# Patient Record
Sex: Female | Born: 1955 | Race: Asian | Hispanic: No | Marital: Single | Smoking: Never smoker
Health system: Southern US, Community
[De-identification: ages and names within clinical notes are randomized; demographics above are authoritative.]

## PROBLEM LIST (undated history)

## (undated) DIAGNOSIS — Z9221 Personal history of antineoplastic chemotherapy: Secondary | ICD-10-CM

## (undated) DIAGNOSIS — Z9289 Personal history of other medical treatment: Secondary | ICD-10-CM

## (undated) DIAGNOSIS — D649 Anemia, unspecified: Secondary | ICD-10-CM

## (undated) DIAGNOSIS — C801 Malignant (primary) neoplasm, unspecified: Secondary | ICD-10-CM

---

## 2004-01-30 ENCOUNTER — Encounter: Admission: RE | Admit: 2004-01-30 | Discharge: 2004-01-30 | Payer: Self-pay | Admitting: Internal Medicine

## 2004-12-10 ENCOUNTER — Encounter: Admission: RE | Admit: 2004-12-10 | Discharge: 2004-12-10 | Payer: Self-pay | Admitting: Internal Medicine

## 2010-06-29 ENCOUNTER — Emergency Department (HOSPITAL_COMMUNITY): Admission: EM | Admit: 2010-06-29 | Discharge: 2010-06-29 | Payer: Self-pay | Admitting: Family Medicine

## 2010-06-29 ENCOUNTER — Emergency Department (HOSPITAL_COMMUNITY)
Admission: EM | Admit: 2010-06-29 | Discharge: 2010-06-29 | Payer: Self-pay | Source: Home / Self Care | Admitting: Emergency Medicine

## 2010-11-09 LAB — COMPREHENSIVE METABOLIC PANEL
ALT: 16 U/L (ref 0–35)
AST: 25 U/L (ref 0–37)
Albumin: 3.9 g/dL (ref 3.5–5.2)
Alkaline Phosphatase: 65 U/L (ref 39–117)
Calcium: 8.8 mg/dL (ref 8.4–10.5)
GFR calc Af Amer: >60 mL/min (ref >60)
Glucose, Bld: 88 mg/dL (ref 70–99)
Potassium: 3.3 mEq/L — ABNORMAL LOW (ref 3.5–5.1)
Sodium: 139 mEq/L (ref 135–145)
Total Protein: 7.7 g/dL (ref 6.0–8.3)

## 2010-11-09 LAB — DIFFERENTIAL
Basophils Relative: 1 % (ref 0–1)
Eosinophils Relative: 2 % (ref 0–5)
Lymphs Abs: 1.5 10*3/uL (ref 0.7–4.0)
Monocytes Absolute: 0.6 10*3/uL (ref 0.1–1.0)
Monocytes Relative: 11 % (ref 3–12)
Neutro Abs: 3.4 10*3/uL (ref 1.7–7.7)

## 2010-11-09 LAB — POCT URINALYSIS DIPSTICK
Ketones, ur: NEGATIVE mg/dL
Nitrite: NEGATIVE
Protein, ur: NEGATIVE mg/dL
pH: 6 (ref 5.0–8.0)

## 2010-11-09 LAB — URINALYSIS, ROUTINE W REFLEX MICROSCOPIC
Bilirubin Urine: NEGATIVE
Glucose, UA: NEGATIVE mg/dL
Ketones, ur: NEGATIVE mg/dL
Nitrite: NEGATIVE
Specific Gravity, Urine: 1.003 — ABNORMAL LOW (ref 1.005–1.030)
pH: 6.5 (ref 5.0–8.0)

## 2010-11-09 LAB — LIPASE, BLOOD: Lipase: 37 U/L (ref 11–59)

## 2010-11-09 LAB — CBC
HCT: 30.9 % — ABNORMAL LOW (ref 36.0–46.0)
MCHC: 30.1 g/dL (ref 30.0–36.0)
Platelets: 219 10*3/uL (ref 150–400)
RDW: 21.4 % — ABNORMAL HIGH (ref 11.5–15.5)
WBC: 5.7 10*3/uL (ref 4.0–10.5)

## 2010-11-09 LAB — POCT PREGNANCY, URINE: Preg Test, Ur: NEGATIVE

## 2010-11-09 LAB — URINE MICROSCOPIC-ADD ON

## 2012-07-12 ENCOUNTER — Ambulatory Visit: Payer: Self-pay | Admitting: Family Medicine

## 2012-07-12 VITALS — BP 124/72 | HR 68 | Temp 97.9°F | Resp 16 | Ht 60.5 in | Wt 116.2 lb

## 2012-07-12 DIAGNOSIS — R319 Hematuria, unspecified: Secondary | ICD-10-CM

## 2012-07-12 DIAGNOSIS — R338 Other retention of urine: Secondary | ICD-10-CM

## 2012-07-12 DIAGNOSIS — R109 Unspecified abdominal pain: Secondary | ICD-10-CM

## 2012-07-12 DIAGNOSIS — D649 Anemia, unspecified: Secondary | ICD-10-CM

## 2012-07-12 LAB — POCT URINALYSIS DIPSTICK
Bilirubin, UA: NEGATIVE
Glucose, UA: NEGATIVE
Ketones, UA: NEGATIVE
Leukocytes, UA: NEGATIVE
Nitrite, UA: NEGATIVE
Spec Grav, UA: >=1.03
Urobilinogen, UA: 4
pH, UA: 6

## 2012-07-12 LAB — POCT CBC
HCT, POC: 35.9 % — AB (ref 37.7–47.9)
Hemoglobin: 10.4 g/dL — AB (ref 12.2–16.2)
Lymph, poc: 1.8 (ref 0.6–3.4)
MCH, POC: 18.5 pg — AB (ref 27–31.2)
MCHC: 29 g/dL — AB (ref 31.8–35.4)
MCV: 63.8 fL — AB (ref 80–97)
POC LYMPH PERCENT: 25.6 %L (ref 10–50)
RDW, POC: 19.5 %
WBC: 7 10*3/uL (ref 4.6–10.2)

## 2012-07-12 LAB — POCT UA - MICROSCOPIC ONLY
Casts, Ur, LPF, POC: NEGATIVE
Crystals, Ur, HPF, POC: NEGATIVE
Epithelial cells, urine per micros: NEGATIVE
Mucus, UA: POSITIVE
Yeast, UA: NEGATIVE

## 2012-07-12 MED ORDER — CIPROFLOXACIN HCL 500 MG PO TABS: 500.0000 mg | ORAL_TABLET | Freq: Two times a day (BID) | ORAL | Status: DC

## 2012-07-12 NOTE — Progress Notes (Signed)
[facility] [address], [place] [address] [phone]  Date:  07/12/2012   Name:  Elizabeth Saunders   DOB:  05/18/1956   MRN:  [id number]  PCP:  No primary provider on file.    Chief Complaint: Abdominal Pain, Back Pain and Urinary Retention   History of Present Illness:  Elizabeth Saunders is a 56 y.o. very pleasant female patient who presents with the following:  She is here today to evaluate lower abdominal pains. She has noted pains in her left lower quadrant and suprapubic areas. She feels a "lump" when she presses on the area which seems to move around.  She has had these symptoms for about 3 weeks.  She has had this sort of pain in the past but no cause was found.   She noted some urinary frequency yesterday- this is now ok.  She can feel "tight" in her abdomen after urination. No hematuria or dysuria noted.   Menopause occurred last year.  She has not had any bleeding since.  No health problems known, no surgical history.    Elizabeth Saunders does not speak [place]- here today with her niece.  She did have a PCP in the past- however since she lost her insurance about 3 years ago she has not been seeing any regular MD.  There is no problem list on file for this patient.   No past medical history on file.  No past surgical history on file.  History  Substance Use Topics  . Smoking status: Never Smoker   . Smokeless tobacco: Not on file  . Alcohol Use: Not on file    No family history on file.  No Known Allergies  Medication list has been reviewed and updated.  No current outpatient prescriptions on file prior to visit.    Review of Systems:  As per HPI- otherwise negative.   Physical Examination: Filed Vitals:   07/12/12 1252  BP: 124/72  Pulse: 68  Temp: 97.9 F (36.6 C)  Resp: 16   Filed Vitals:   07/12/12 1252  Height: 5' 0.5" (1.537 m)  Weight: 116 lb 3.2 oz (52.708 kg)   Body mass index is 22.32 kg/(m^2). Ideal Body Weight: Weight in (lb) to  have BMI = 25: 129.9   GEN: WDWN, NAD, Non-toxic, A & O x 3, slim build HEENT: Atraumatic, Normocephalic. Neck supple. No masses, No LAD.  Bilateral TM wnl, oropharynx normal.  PEERL,EOMI.   Ears and Nose: No external deformity. CV: RRR, No M/G/R. No JVD. No thrill. No extra heart sounds. PULM: CTA B, no wheezes, crackles, rhonchi. No retractions. No resp. distress. No accessory muscle use. ABD: S, NT, ND, +BS. No rebound. No HSM.  She may have a slightly enlarged uterus, but no abnormal mass EXTR: No c/c/e NEURO Normal gait.  PSYCH: Normally interactive. Conversant. Not depressed or anxious appearing.  Calm demeanor.  GU: normal internal and external exam.  Uterus may be slightly enlarged.   No adnexal masses.  No reproducible tenderness.   Results for orders placed in visit on 07/12/12  POCT UA - MICROSCOPIC ONLY      Component Value Range   WBC, Ur, HPF, POC 1-2     RBC, urine, microscopic tntc     Bacteria, U Microscopic 1+     Mucus, UA pos     Epithelial cells, urine per micros neg     Crystals, Ur, HPF, POC neg     Casts, Ur, LPF, POC  neg     Yeast, UA neg    POCT URINALYSIS DIPSTICK      Component Value Range   Color, UA yellow     Clarity, UA clear     Glucose, UA neg     Bilirubin, UA neg     Ketones, UA neg     Spec Grav, UA >=1.030     Blood, UA trace     pH, UA 6.0     Protein, UA trace     Urobilinogen, UA 4.0     Nitrite, UA neg     Leukocytes, UA Negative    POCT CBC      Component Value Range   WBC 7.0  4.6 - 10.2 K/uL   Lymph, poc 1.8  0.6 - 3.4   POC LYMPH PERCENT 25.6  10 - 50 %L   MID (cbc) 0.4  0 - 0.9   POC MID % 5.5  0 - 12 %M   POC Granulocyte 4.8  2 - 6.9   Granulocyte percent 68.9  37 - 80 %G   RBC 5.62 (*) 4.04 - 5.48 M/uL   Hemoglobin 10.4 (*) 12.2 - 16.2 g/dL   HCT, POC 25.9 (*) 56.3 - 47.9 %   MCV 63.8 (*) 80 - 97 fL   MCH, POC 18.5 (*) 27 - 31.2 pg   MCHC 29.0 (*) 31.8 - 35.4 g/dL   RDW, POC 87.5     Platelet Count, POC 329  142 -  424 K/uL   MPV 0  0 - 99.8 fL  POCT URINE PREGNANCY      Component Value Range   Preg Test, Ur Negative      Assessment and Plan: 1. Abdominal  pain, other specified site  POCT CBC, POCT urine pregnancy, US Pelvis Complete  2. Acute urinary retention  POCT UA - Microscopic Only, POCT urinalysis dipstick  3. Anemia  Ferritin  4. Hematuria  Urine culture, ciprofloxacin (CIPRO) 500 MG tablet   Of note she does not actually have urinary retention, but I am not able to remove this diagnosis.   Discussed in detail with patient and her niece [employer]a [employer] who is here with her today.  Plan ultrasound to evaluate her uterine size and feeling of a lump in her abdomen.  Will treat her with cipro for possible UTI and await urine culture.  If culture negative will need to further evalaute microhematuria.   Await ferritin to evaluate her anemia.  They will let me know if she is getting worse in the meantime or if she does not feel better   Abbe Amsterdam, MD

## 2012-07-12 NOTE — Patient Instructions (Signed)
Please use the cipro antibiotic for your urine. I will be in touch with you regarding your labs and with an appointment for your ultrasound

## 2012-07-13 LAB — FERRITIN: Ferritin: 3 ng/mL — ABNORMAL LOW (ref 10–291)

## 2012-07-14 ENCOUNTER — Telehealth: Payer: Self-pay | Admitting: Family Medicine

## 2012-07-14 DIAGNOSIS — D509 Iron deficiency anemia, unspecified: Secondary | ICD-10-CM

## 2012-07-14 MED ORDER — FERROUS SULFATE 325 (65 FE) MG PO TABS: 325.0000 mg | ORAL_TABLET | Freq: Two times a day (BID) | ORAL | Status: DC

## 2012-07-14 NOTE — Telephone Encounter (Signed)
Called and went over her aunt's labs.  Her iron level is quite low- will start her on an iron supplement. She does feel better with the cipro, but her urine culture was negative.  We need to follow- up her microhematuria.  She will have a pelvic ultrasound next week.  I will call them after her [place] is back, but asked Hlyla to please bring Elizabeth Saunders back to see me in a couple of weeks for follow- up.  She agreed.

## 2012-07-17 ENCOUNTER — Telehealth: Payer: Self-pay | Admitting: Radiology

## 2012-07-17 DIAGNOSIS — R102 Pelvic and perineal pain: Secondary | ICD-10-CM

## 2012-07-17 NOTE — Telephone Encounter (Signed)
For complete pelvic US/ transvaginal needed, this is ordered.

## 2012-07-19 ENCOUNTER — Ambulatory Visit
Admission: RE | Admit: 2012-07-19 | Discharge: 2012-07-19 | Disposition: A | Discharge status: Home / Self Care | Payer: No Typology Code available for payment source | Source: Ambulatory Visit | Attending: Family Medicine | Admitting: Family Medicine

## 2012-07-19 DIAGNOSIS — R109 Unspecified abdominal pain: Secondary | ICD-10-CM

## 2012-07-19 DIAGNOSIS — R102 Pelvic and perineal pain: Secondary | ICD-10-CM

## 2012-07-29 ENCOUNTER — Encounter: Payer: Self-pay | Admitting: Family Medicine

## 2012-08-15 ENCOUNTER — Encounter: Payer: Self-pay | Admitting: Radiology

## 2012-08-24 ENCOUNTER — Telehealth: Payer: Self-pay

## 2012-08-24 NOTE — Telephone Encounter (Signed)
Pt son is calling stating that they received a letter stating unable to reach patient about the ultrasound results  Best number [phone]

## 2012-08-24 NOTE — Telephone Encounter (Signed)
Called to advise patient needs to follow up. I spoke to him to advise. He will make sure she follows up.

## 2014-09-01 ENCOUNTER — Ambulatory Visit: Payer: Self-pay | Attending: Family Medicine

## 2015-05-14 ENCOUNTER — Ambulatory Visit (INDEPENDENT_AMBULATORY_CARE_PROVIDER_SITE_OTHER): Payer: Self-pay | Admitting: Family Medicine

## 2015-05-14 ENCOUNTER — Ambulatory Visit (INDEPENDENT_AMBULATORY_CARE_PROVIDER_SITE_OTHER): Payer: Self-pay

## 2015-05-14 VITALS — BP 118/76 | HR 76 | Temp 97.5°F | Resp 16 | Ht 61.0 in | Wt 119.0 lb

## 2015-05-14 DIAGNOSIS — K5900 Constipation, unspecified: Secondary | ICD-10-CM

## 2015-05-14 DIAGNOSIS — R1084 Generalized abdominal pain: Secondary | ICD-10-CM

## 2015-05-14 DIAGNOSIS — R350 Frequency of micturition: Secondary | ICD-10-CM

## 2015-05-14 DIAGNOSIS — K625 Hemorrhage of anus and rectum: Secondary | ICD-10-CM

## 2015-05-14 DIAGNOSIS — R319 Hematuria, unspecified: Secondary | ICD-10-CM

## 2015-05-14 DIAGNOSIS — D649 Anemia, unspecified: Secondary | ICD-10-CM

## 2015-05-14 LAB — POCT UA - MICROSCOPIC ONLY
Bacteria, U Microscopic: NEGATIVE
CASTS, UR, LPF, POC: NEGATIVE
CRYSTALS, UR, HPF, POC: NEGATIVE
Mucus, UA: POSITIVE
Yeast, UA: NEGATIVE

## 2015-05-14 LAB — POCT CBC
GRANULOCYTE PERCENT: 65.3 % (ref 37–80)
HEMATOCRIT: 36.2 % — AB (ref 37.7–47.9)
HEMOGLOBIN: 10.8 g/dL — AB (ref 12.2–16.2)
Lymph, poc: 2.2 (ref 0.6–3.4)
MCH: 19.2 pg — AB (ref 27–31.2)
MCHC: 29.8 g/dL — AB (ref 31.8–35.4)
MCV: 64.6 fL — AB (ref 80–97)
MID (cbc): 0.7 (ref 0–0.9)
MPV: 8.2 fL (ref 0–99.8)
POC GRANULOCYTE: 5.4 (ref 2–6.9)
POC LYMPH PERCENT: 26.6 %L (ref 10–50)
POC MID %: 8.1 % (ref 0–12)
Platelet Count, POC: 332 10*3/uL (ref 142–424)
RBC: 5.61 M/uL — AB (ref 4.04–5.48)
RDW, POC: 18.5 %
WBC: 8.3 10*3/uL (ref 4.6–10.2)

## 2015-05-14 LAB — POCT URINALYSIS DIPSTICK
BILIRUBIN UA: NEGATIVE
GLUCOSE UA: NEGATIVE
KETONES UA: NEGATIVE
Leukocytes, UA: NEGATIVE
Nitrite, UA: NEGATIVE
PH UA: 6
Protein, UA: NEGATIVE
SPEC GRAV UA: 1.025
Urobilinogen, UA: 0.2

## 2015-05-14 NOTE — Progress Notes (Addendum)
Subjective:  This chart was scribed for Merri Ray, MD by [facility], medical scribe at [facility].The patient was seen in exam room 02 and the patient's care was started at 1:22 PM.   Patient ID: Elizabeth Saunders, female    DOB: 1956/04/09, 59 y.o.   MRN: [id number] Chief Complaint  Patient presents with  . Abdominal Pain    x 2 weeks   . Back Pain  . blood in stool       . Constipation   HPI HPI Comments: Elizabeth Saunders is a 59 y.o. female who presents to [facility] complaining of LLQ, and suprapubic abdominal pain onset three weeks ago. Gradually worsening, recurrent pain, similar to pain in 2013. Intermittent over past 3 years but constant 3 weeks ago. Constipation began this week, having a BM everyday but straining. Noticed blood in her stool with every BM for the past three weeks, no melena. She never had a colonoscopy, no hx of GI or colon issues. Taken tylenol or Advil with no improvement. Increased urinary frequency but no other urinary symptoms, no hematuria. Cold spells at night. Also complains of bilateral lower back pain, pain has been present for the past two years. No nausea, or vomiting. Denies lightheadedness. Some dizziness at night, this is a new complaint over the past week. No dizziness currently.   At previous visit, seen for abdominal pain in 2013. Noted to have anemia with hemoglobin of 10.4 at that time. Started on iron supplements. She has not been taking her iron supplement. Abdominal US showed a normal postmenopausal uterine and right ovary. Small amount of free fluid in the pelvis, Nov 2013. Neg urine culture at that visit but did have hematuria.  There are no active problems to display for this patient.  History reviewed. No pertinent past medical history. History reviewed. No pertinent past surgical history. No Known Allergies Prior to Admission medications   Medication Sig Start Date End Date Taking? Authorizing Provider    acetaminophen (TYLENOL) 325 MG tablet Take 650 mg by mouth every 6 (six) hours as needed.   Yes Historical Provider, MD  ibuprofen (ADVIL,MOTRIN) 100 MG/5ML suspension Take 200 mg by mouth every 4 (four) hours as needed.   Yes Historical Provider, MD   Social History   Social History  . Marital Status: Single    Spouse Name: N/A  . Number of Children: N/A  . Years of Education: N/A   Occupational History  . Not on file.   Social History Main Topics  . Smoking status: Never Smoker   . Smokeless tobacco: Never Used  . Alcohol Use: No  . Drug Use: No  . Sexual Activity: Not on file   Other Topics Concern  . Not on file   Social History Narrative   Review of Systems  Constitutional: Positive for chills.  Gastrointestinal: Positive for abdominal pain, constipation and blood in stool. Negative for nausea and vomiting.  Genitourinary: Positive for frequency. Negative for dysuria and hematuria.  Musculoskeletal: Positive for back pain.  Neurological: Negative for dizziness and light-headedness.      Objective:  BP 118/76 mmHg  Pulse 76  Temp(Src) 97.5 F (36.4 C) (Oral)  Resp 16  Ht 5\' 1"  (1.549 m)  Wt 119 lb (53.978 kg)  BMI 22.50 kg/m2  SpO2 99% Physical Exam  Constitutional: She is oriented to person, place, and time. She appears well-developed and well-nourished. No distress.  HENT:  Head: Normocephalic and  atraumatic.  Eyes: Pupils are equal, round, and reactive to light.  Neck: Normal range of motion.  Cardiovascular: Normal rate and regular rhythm.   Pulmonary/Chest: Effort normal. No respiratory distress.  Abdominal: Soft. Bowel sounds are normal. There is no CVA tenderness and negative Murphy's sign.  LLQ and suprapubic tenderness, minimal RLQ tenderness.   Genitourinary: Rectal exam shows no external hemorrhoid, no fissure, no mass, no tenderness and anal tone normal.  Musculoskeletal: Normal range of motion.  Slight discomfort of the paraspinal muscles of  the lumbar spine but diffuse.  Neurological: She is alert and oriented to person, place, and time.  Skin: Skin is warm and dry.  Psychiatric: She has a normal mood and affect. Her behavior is normal.  Nursing note and vitals reviewed.  Results for orders placed or performed in visit on 05/14/15  POCT CBC  Result Value Ref Range   WBC 8.3 4.6 - 10.2 K/uL   Lymph, poc 2.2 0.6 - 3.4   POC LYMPH PERCENT 26.6 10 - 50 %L   MID (cbc) 0.7 0 - 0.9   POC MID % 8.1 0 - 12 %M   POC Granulocyte 5.4 2 - 6.9   Granulocyte percent 65.3 37 - 80 %G   RBC 5.61 (A) 4.04 - 5.48 M/uL   Hemoglobin 10.8 (A) 12.2 - 16.2 g/dL   HCT, POC 36.2 (A) 37.7 - 47.9 %   MCV 64.6 (A) 80 - 97 fL   MCH, POC 19.2 (A) 27 - 31.2 pg   MCHC 29.8 (A) 31.8 - 35.4 g/dL   RDW, POC 18.5 %   Platelet Count, POC 332 142 - 424 K/uL   MPV 8.2 0 - 99.8 fL  POCT urinalysis dipstick  Result Value Ref Range   Color, UA yellow    Clarity, UA clear    Glucose, UA negative    Bilirubin, UA negative    Ketones, UA negative    Spec Grav, UA 1.025    Blood, UA trace-intact    pH, UA 6.0    Protein, UA negative    Urobilinogen, UA 0.2    Nitrite, UA negative    Leukocytes, UA Negative Negative  POCT UA - Microscopic Only  Result Value Ref Range   WBC, Ur, HPF, POC 0-1    RBC, urine, microscopic 0-3    Bacteria, U Microscopic negative    Mucus, UA positive    Epithelial cells, urine per micros 0-1    Crystals, Ur, HPF, POC negative    Casts, Ur, LPF, POC negative    Yeast, UA negative    [facility] reading (PRIMARY) by  Dr. Carlota Raspberry: Abdomen 1 view: large amount of stool - right greater than left. Otherwise nonspecific bowel gas findings.     Assessment & Plan:   Elizabeth Saunders is a 59 y.o. female Generalized abdominal pain - Plan: POCT CBC, DG Abd 1 View, Ambulatory referral to Gastroenterology, Rectal bleeding - Plan: POCT CBC, DG Abd 1 View, Ambulatory referral to Gastroenterology, Anemia, unspecified anemia type - Plan: POCT  CBC Constipation, unspecified constipation type - Plan: Ambulatory referral to Gastroenterology  - suspected component of constipation with her abdominal pain, which may be cause of pain the past as well. However she does have anemia which she has also had in the past. She has not had colonoscopy for colon cancer screening, and now with reported rectal bleeding, stressed importance that this be done. I have referred her to gastroenterology, but if there is a  practice that is less expensive, can let me know  to refer her there.  Can try over-the-counter Colace, MiraLAX if needed, and handout given on constipation.  Start back on iron once a day for her anemia, with recheck CBC in the next 4-6 weeks.  Urinary frequency , Hematuria - Plan: POCT urinalysis dipstick, POCT UA - Microscopic Only, Urine culture  - no definite UTI on urine testing. Slight blood, recommended  Probable urology eval, but urine culture pending.   Language barrier. Son translating, understanding expressed. RTC/ER precautions discussed for above.  Meds ordered this encounter  Medications  . acetaminophen (TYLENOL) 325 MG tablet    Sig: Take 650 mg by mouth every 6 (six) hours as needed.  Marland Kitchen ibuprofen (ADVIL,MOTRIN) 100 MG/5ML suspension    Sig: Take 200 mg by mouth every 4 (four) hours as needed.   Patient Instructions  Your x-ray does appear to indicate constipation.  This may be why you're having the abdominal pain, and sometimes straining with hard stools can also cause blood in the stool. Start stool softener such as Colace once per day. Then if no bowel movement for 2 days, take MiraLAX over-the-counter once. If your abdominal pain is not improving in the next week with this treatment, or any fever, nausea, vomiting, or worsening pain sooner  - return here or the emergency room. If the blood in the stool remains after the bowel movements have softened, return to discuss further as other causes of blood in the stool are  possible. This can also be discussed at appointment with gastroenterologist. Recommend repeat blood test to recheck anemia in next 6 weeks.   You are still anemic or "low blood count". There is information on this below. Restart iron supplement over-the-counter (ferrous sulfate 325 mg once per day). Because of your abdominal pain and this anemia, it is important that you see a gastroenterologist as you are overdue for a colonoscopy.  I will place a referral, but if there is a practice that would be more cost effective for you, please let us know and we can change this.  There is still some blood in the urine today, so we'll check another culture to see if this is an infection. If this urine culture is negative or normal, would recommend follow-up with a urologist to determine if other causes of blood in the urine as you had this 3 years ago as well.   Abdominal Pain Many things can cause abdominal pain. Usually, abdominal pain is not caused by a disease and will improve without treatment. It can often be observed and treated at home. Your health care provider will do a physical exam and possibly order blood tests and X-rays to help determine the seriousness of your pain. However, in many cases, more time must pass before a clear cause of the pain can be found. Before that point, your health care provider may not know if you need more testing or further treatment. HOME CARE INSTRUCTIONS  Monitor your abdominal pain for any changes. The following actions may help to alleviate any discomfort you are experiencing:  Only take over-the-counter or prescription medicines as directed by your health care provider.  Do not take laxatives unless directed to do so by your health care provider.  Try a clear liquid diet (broth, tea, or water) as directed by your health care provider. Slowly move to a bland diet as tolerated. SEEK MEDICAL CARE IF:  You have unexplained abdominal pain.  You have abdominal pain  associated with nausea or diarrhea.  You have pain when you urinate or have a bowel movement.  You experience abdominal pain that wakes you in the night.  You have abdominal pain that is worsened or improved by eating food.  You have abdominal pain that is worsened with eating fatty foods.  You have a fever. SEEK IMMEDIATE MEDICAL CARE IF:   Your pain does not go away within 2 hours.  You keep throwing up (vomiting).  Your pain is felt only in portions of the abdomen, such as the right side or the left lower portion of the abdomen.  You pass bloody or black tarry stools. MAKE SURE YOU:  Understand these instructions.   Will watch your condition.   Will get help right away if you are not doing well or get worse.  Document Released: 05/25/2005 Document Revised: 08/20/2013 Document Reviewed: 04/24/2013 [facility] Patient Information 2015 [place], [place]. This information is not intended to replace advice given to you by your health care provider. Make sure you discuss any questions you have with your health care provider.   Hematuria Hematuria is blood in your urine. It can be caused by a bladder infection, kidney infection, prostate infection, kidney stone, or cancer of your urinary tract. Infections can usually be treated with medicine, and a kidney stone usually will pass through your urine. If neither of these is the cause of your hematuria, further workup to find out the reason may be needed. It is very important that you tell your health care provider about any blood you see in your urine, even if the blood stops without treatment or happens without causing pain. Blood in your urine that happens and then stops and then happens again can be a symptom of a very serious condition. Also, pain is not a symptom in the initial stages of many urinary cancers. HOME CARE INSTRUCTIONS   Drink lots of fluid, 3-4 quarts a day. If you have been diagnosed with an infection, cranberry juice  is especially recommended, in addition to large amounts of water.  Avoid caffeine, tea, and carbonated beverages because they tend to irritate the bladder.  Avoid alcohol because it may irritate the prostate.  Take all medicines as directed by your health care provider.  If you were prescribed an antibiotic medicine, finish it all even if you start to feel better.  If you have been diagnosed with a kidney stone, follow your health care provider's instructions regarding straining your urine to catch the stone.  Empty your bladder often. Avoid holding urine for long periods of time.  After a bowel movement, women should cleanse front to back. Use each tissue only once.  Empty your bladder before and after sexual intercourse if you are a female. SEEK MEDICAL CARE IF:  You develop back pain.  You have a fever.  You have a feeling of sickness in your stomach (nausea) or vomiting.  Your symptoms are not better in 3 days. Return sooner if you are getting worse. SEEK IMMEDIATE MEDICAL CARE IF:   You develop severe vomiting and are unable to keep the medicine down.  You develop severe back or abdominal pain despite taking your medicines.  You begin passing a large amount of blood or clots in your urine.  You feel extremely weak or faint, or you pass out. MAKE SURE YOU:   Understand these instructions.  Will watch your condition.  Will get help right away if you are not doing well or get worse. Document  Released: 08/15/2005 Document Revised: 12/30/2013 Document Reviewed: 04/15/2013 [facility] Patient Information 2015 [place], [place]. This information is not intended to replace advice given to you by your health care provider. Make sure you discuss any questions you have with your health care provider.   Rectal Bleeding Rectal bleeding is when blood passes out of the anus. It is usually a sign that something is wrong. It may not be serious, but it should always be evaluated. Rectal  bleeding may present as bright red blood or extremely dark stools. The color may range from dark red or maroon to black (like tar). It is important that the cause of rectal bleeding be identified so treatment can be started and the problem corrected. CAUSES   Hemorrhoids. These are enlarged (dilated) blood vessels or veins in the anal or rectal area.  Fistulas. Theseare abnormal, burrowing channels that usually run from inside the rectum to the skin around the anus. They can bleed.  Anal fissures. This is a tear in the tissue of the anus. Bleeding occurs with bowel movements.  Diverticulosis. This is a condition in which pockets or sacs project from the bowel wall. Occasionally, the sacs can bleed.  Diverticulitis. Thisis an infection involving diverticulosis of the colon.  Proctitis and colitis. These are conditions in which the rectum, colon, or both, can become inflamed and pitted (ulcerated).  Polyps and cancer. Polyps are non-cancerous (benign) growths in the colon that may bleed. Certain types of polyps turn into cancer.  Protrusion of the rectum. Part of the rectum can project from the anus and bleed.  Certain medicines.  Intestinal infections.  Blood vessel abnormalities. HOME CARE INSTRUCTIONS  Eat a high-fiber diet to keep your stool soft.  Limit activity.  Drink enough fluids to keep your urine clear or pale yellow.  Warm baths may be useful to soothe rectal pain.  Follow up with your caregiver as directed. SEEK IMMEDIATE MEDICAL CARE IF:  You develop increased bleeding.  You have black or dark red stools.  You vomit blood or material that looks like coffee grounds.  You have abdominal pain or tenderness.  You have a fever.  You feel weak, nauseous, or you faint.  You have severe rectal pain or you are unable to have a bowel movement. MAKE SURE YOU:  Understand these instructions.  Will watch your condition.  Will get help right away if you are not  doing well or get worse. Document Released: 02/04/2002 Document Revised: 11/07/2011 Document Reviewed: 01/30/2011 [facility] Patient Information 2015 [place], [place]. This information is not intended to replace advice given to you by your health care provider. Make sure you discuss any questions you have with your health care provider. Constipation Constipation is when a person has fewer than three bowel movements a week, has difficulty having a bowel movement, or has stools that are dry, hard, or larger than normal. As people grow older, constipation is more common. If you try to fix constipation with medicines that make you have a bowel movement (laxatives), the problem may get worse. Long-term laxative use may cause the muscles of the colon to become weak. A low-fiber diet, not taking in enough fluids, and taking certain medicines may make constipation worse.  CAUSES   Certain medicines, such as antidepressants, pain medicine, iron supplements, antacids, and water pills.   Certain diseases, such as diabetes, irritable bowel syndrome (IBS), thyroid disease, or depression.   Not drinking enough water.   Not eating enough fiber-rich foods.   Stress or  travel.   Lack of physical activity or exercise.   Ignoring the urge to have a bowel movement.   Using laxatives too much.  SIGNS AND SYMPTOMS   Having fewer than three bowel movements a week.   Straining to have a bowel movement.   Having stools that are hard, dry, or larger than normal.   Feeling full or bloated.   Pain in the lower abdomen.   Not feeling relief after having a bowel movement.  DIAGNOSIS  Your health care provider will take a medical history and perform a physical exam. Further testing may be done for severe constipation. Some tests may include:  A barium enema X-ray to examine your rectum, colon, and, sometimes, your small intestine.   A sigmoidoscopy to examine your lower colon.   A colonoscopy  to examine your entire colon. TREATMENT  Treatment will depend on the severity of your constipation and what is causing it. Some dietary treatments include drinking more fluids and eating more fiber-rich foods. Lifestyle treatments may include regular exercise. If these diet and lifestyle recommendations do not help, your health care provider may recommend taking over-the-counter laxative medicines to help you have bowel movements. Prescription medicines may be prescribed if over-the-counter medicines do not work.  HOME CARE INSTRUCTIONS   Eat foods that have a lot of fiber, such as fruits, vegetables, whole grains, and beans.  Limit foods high in fat and processed sugars, such as french fries, hamburgers, cookies, candies, and soda.   A fiber supplement may be added to your diet if you cannot get enough fiber from foods.   Drink enough fluids to keep your urine clear or pale yellow.   Exercise regularly or as directed by your health care provider.   Go to the restroom when you have the urge to go. Do not hold it.   Only take over-the-counter or prescription medicines as directed by your health care provider. Do not take other medicines for constipation without talking to your health care provider first.  [place] IF:   You have bright red blood in your stool.   Your constipation lasts for more than 4 days or gets worse.   You have abdominal or rectal pain.   You have thin, pencil-like stools.   You have unexplained weight loss. MAKE SURE YOU:   Understand these instructions.  Will watch your condition.  Will get help right away if you are not doing well or get worse. Document Released: 05/13/2004 Document Revised: 08/20/2013 Document Reviewed: 05/27/2013 [facility] Patient Information 2015 [place], [place]. This information is not intended to replace advice given to you by your health care provider. Make sure you discuss any questions you have with your  health care provider.       I personally performed the services described in this documentation, which was scribed in my presence. The recorded information has been reviewed and considered, and addended by me as needed.

## 2015-05-14 NOTE — Patient Instructions (Addendum)
Your x-ray does appear to indicate constipation.  This may be why you're having the abdominal pain, and sometimes straining with hard stools can also cause blood in the stool. Start stool softener such as Colace once per day. Then if no bowel movement for 2 days, take MiraLAX over-the-counter once. If your abdominal pain is not improving in the next week with this treatment, or any fever, nausea, vomiting, or worsening pain sooner  - return here or the emergency room. If the blood in the stool remains after the bowel movements have softened, return to discuss further as other causes of blood in the stool are possible. This can also be discussed at appointment with gastroenterologist. Recommend repeat blood test to recheck anemia in next 6 weeks.   You are still anemic or "low blood count". There is information on this below. Restart iron supplement over-the-counter (ferrous sulfate 325 mg once per day). Because of your abdominal pain and this anemia, it is important that you see a gastroenterologist as you are overdue for a colonoscopy.  I will place a referral, but if there is a practice that would be more cost effective for you, please let us know and we can change this.  There is still some blood in the urine today, so we'll check another culture to see if this is an infection. If this urine culture is negative or normal, would recommend follow-up with a urologist to determine if other causes of blood in the urine as you had this 3 years ago as well.   Abdominal Pain Many things can cause abdominal pain. Usually, abdominal pain is not caused by a disease and will improve without treatment. It can often be observed and treated at home. Your health care provider will do a physical exam and possibly order blood tests and X-rays to help determine the seriousness of your pain. However, in many cases, more time must pass before a clear cause of the pain can be found. Before that point, your health care provider  may not know if you need more testing or further treatment. HOME CARE INSTRUCTIONS  Monitor your abdominal pain for any changes. The following actions may help to alleviate any discomfort you are experiencing:  Only take over-the-counter or prescription medicines as directed by your health care provider.  Do not take laxatives unless directed to do so by your health care provider.  Try a clear liquid diet (broth, tea, or water) as directed by your health care provider. Slowly move to a bland diet as tolerated. SEEK MEDICAL CARE IF:  You have unexplained abdominal pain.  You have abdominal pain associated with nausea or diarrhea.  You have pain when you urinate or have a bowel movement.  You experience abdominal pain that wakes you in the night.  You have abdominal pain that is worsened or improved by eating food.  You have abdominal pain that is worsened with eating fatty foods.  You have a fever. SEEK IMMEDIATE MEDICAL CARE IF:   Your pain does not go away within 2 hours.  You keep throwing up (vomiting).  Your pain is felt only in portions of the abdomen, such as the right side or the left lower portion of the abdomen.  You pass bloody or black tarry stools. MAKE SURE YOU:  Understand these instructions.   Will watch your condition.   Will get help right away if you are not doing well or get worse.  Document Released: 05/25/2005 Document Revised: 08/20/2013 Document Reviewed: 04/24/2013  [employer] Patient Information 2015 [place]. This information is not intended to replace advice given to you by your health care provider. Make sure you discuss any questions you have with your health care provider.   Hematuria Hematuria is blood in your urine. It can be caused by a bladder infection, kidney infection, prostate infection, kidney stone, or cancer of your urinary tract. Infections can usually be treated with medicine, and a kidney stone usually will pass through  your urine. If neither of these is the cause of your hematuria, further workup to find out the reason may be needed. It is very important that you tell your health care provider about any blood you see in your urine, even if the blood stops without treatment or happens without causing pain. Blood in your urine that happens and then stops and then happens again can be a symptom of a very serious condition. Also, pain is not a symptom in the initial stages of many urinary cancers. HOME CARE INSTRUCTIONS   Drink lots of fluid, 3-4 quarts a day. If you have been diagnosed with an infection, cranberry juice is especially recommended, in addition to large amounts of water.  Avoid caffeine, tea, and carbonated beverages because they tend to irritate the bladder.  Avoid alcohol because it may irritate the prostate.  Take all medicines as directed by your health care provider.  If you were prescribed an antibiotic medicine, finish it all even if you start to feel better.  If you have been diagnosed with a kidney stone, follow your health care provider's instructions regarding straining your urine to catch the stone.  Empty your bladder often. Avoid holding urine for long periods of time.  After a bowel movement, women should cleanse front to back. Use each tissue only once.  Empty your bladder before and after sexual intercourse if you are a female. SEEK MEDICAL CARE IF:  You develop back pain.  You have a fever.  You have a feeling of sickness in your stomach (nausea) or vomiting.  Your symptoms are not better in 3 days. Return sooner if you are getting worse. SEEK IMMEDIATE MEDICAL CARE IF:   You develop severe vomiting and are unable to keep the medicine down.  You develop severe back or abdominal pain despite taking your medicines.  You begin passing a large amount of blood or clots in your urine.  You feel extremely weak or faint, or you pass out. MAKE SURE YOU:   Understand  these instructions.  Will watch your condition.  Will get help right away if you are not doing well or get worse. Document Released: 08/15/2005 Document Revised: 12/30/2013 Document Reviewed: 04/15/2013 [facility] Patient Information 2015 [place], [place]. This information is not intended to replace advice given to you by your health care provider. Make sure you discuss any questions you have with your health care provider.   Rectal Bleeding Rectal bleeding is when blood passes out of the anus. It is usually a sign that something is wrong. It may not be serious, but it should always be evaluated. Rectal bleeding may present as bright red blood or extremely dark stools. The color may range from dark red or maroon to black (like tar). It is important that the cause of rectal bleeding be identified so treatment can be started and the problem corrected. CAUSES   Hemorrhoids. These are enlarged (dilated) blood vessels or veins in the anal or rectal area.  Fistulas. Theseare abnormal, burrowing channels that usually run from  inside the rectum to the skin around the anus. They can bleed.  Anal fissures. This is a tear in the tissue of the anus. Bleeding occurs with bowel movements.  Diverticulosis. This is a condition in which pockets or sacs project from the bowel wall. Occasionally, the sacs can bleed.  Diverticulitis. Thisis an infection involving diverticulosis of the colon.  Proctitis and colitis. These are conditions in which the rectum, colon, or both, can become inflamed and pitted (ulcerated).  Polyps and cancer. Polyps are non-cancerous (benign) growths in the colon that may bleed. Certain types of polyps turn into cancer.  Protrusion of the rectum. Part of the rectum can project from the anus and bleed.  Certain medicines.  Intestinal infections.  Blood vessel abnormalities. HOME CARE INSTRUCTIONS  Eat a high-fiber diet to keep your stool soft.  Limit activity.  Drink  enough fluids to keep your urine clear or pale yellow.  Warm baths may be useful to soothe rectal pain.  Follow up with your caregiver as directed. SEEK IMMEDIATE MEDICAL CARE IF:  You develop increased bleeding.  You have black or dark red stools.  You vomit blood or material that looks like coffee grounds.  You have abdominal pain or tenderness.  You have a fever.  You feel weak, nauseous, or you faint.  You have severe rectal pain or you are unable to have a bowel movement. MAKE SURE YOU:  Understand these instructions.  Will watch your condition.  Will get help right away if you are not doing well or get worse. Document Released: 02/04/2002 Document Revised: 11/07/2011 Document Reviewed: 01/30/2011 [facility] Patient Information 2015 [place], [place]. This information is not intended to replace advice given to you by your health care provider. Make sure you discuss any questions you have with your health care provider. Constipation Constipation is when a person has fewer than three bowel movements a week, has difficulty having a bowel movement, or has stools that are dry, hard, or larger than normal. As people grow older, constipation is more common. If you try to fix constipation with medicines that make you have a bowel movement (laxatives), the problem may get worse. Long-term laxative use may cause the muscles of the colon to become weak. A low-fiber diet, not taking in enough fluids, and taking certain medicines may make constipation worse.  CAUSES   Certain medicines, such as antidepressants, pain medicine, iron supplements, antacids, and water pills.   Certain diseases, such as diabetes, irritable bowel syndrome (IBS), thyroid disease, or depression.   Not drinking enough water.   Not eating enough fiber-rich foods.   Stress or travel.   Lack of physical activity or exercise.   Ignoring the urge to have a bowel movement.   Using laxatives too much.   SIGNS AND SYMPTOMS   Having fewer than three bowel movements a week.   Straining to have a bowel movement.   Having stools that are hard, dry, or larger than normal.   Feeling full or bloated.   Pain in the lower abdomen.   Not feeling relief after having a bowel movement.  DIAGNOSIS  Your health care provider will take a medical history and perform a physical exam. Further testing may be done for severe constipation. Some tests may include:  A barium enema X-ray to examine your rectum, colon, and, sometimes, your small intestine.   A sigmoidoscopy to examine your lower colon.   A colonoscopy to examine your entire colon. TREATMENT  Treatment will depend on  the severity of your constipation and what is causing it. Some dietary treatments include drinking more fluids and eating more fiber-rich foods. Lifestyle treatments may include regular exercise. If these diet and lifestyle recommendations do not help, your health care provider may recommend taking over-the-counter laxative medicines to help you have bowel movements. Prescription medicines may be prescribed if over-the-counter medicines do not work.  HOME CARE INSTRUCTIONS   Eat foods that have a lot of fiber, such as fruits, vegetables, whole grains, and beans.  Limit foods high in fat and processed sugars, such as french fries, hamburgers, cookies, candies, and soda.   A fiber supplement may be added to your diet if you cannot get enough fiber from foods.   Drink enough fluids to keep your urine clear or pale yellow.   Exercise regularly or as directed by your health care provider.   Go to the restroom when you have the urge to go. Do not hold it.   Only take over-the-counter or prescription medicines as directed by your health care provider. Do not take other medicines for constipation without talking to your health care provider first.  [place] IF:   You have bright red blood in your  stool.   Your constipation lasts for more than 4 days or gets worse.   You have abdominal or rectal pain.   You have thin, pencil-like stools.   You have unexplained weight loss. MAKE SURE YOU:   Understand these instructions.  Will watch your condition.  Will get help right away if you are not doing well or get worse. Document Released: 05/13/2004 Document Revised: 08/20/2013 Document Reviewed: 05/27/2013 [facility] Patient Information 2015 [place], [place]. This information is not intended to replace advice given to you by your health care provider. Make sure you discuss any questions you have with your health care provider.

## 2015-05-15 ENCOUNTER — Telehealth: Payer: Self-pay | Admitting: Family Medicine

## 2015-05-15 LAB — URINE CULTURE
COLONY COUNT: NO GROWTH
ORGANISM ID, BACTERIA: NO GROWTH

## 2015-05-15 NOTE — Telephone Encounter (Signed)
Call received regarding prescription for iron. Discussed plan was to use over-the-counter iron supplement once per day. If prescription needed,  Call back in the next few days and I'll be happy  To prescribe  It if needed

## 2016-02-05 ENCOUNTER — Encounter (HOSPITAL_COMMUNITY): Payer: Self-pay | Admitting: Emergency Medicine

## 2016-02-05 ENCOUNTER — Ambulatory Visit (INDEPENDENT_AMBULATORY_CARE_PROVIDER_SITE_OTHER): Payer: Self-pay | Admitting: Physician Assistant

## 2016-02-05 ENCOUNTER — Emergency Department (HOSPITAL_COMMUNITY): Payer: Self-pay

## 2016-02-05 ENCOUNTER — Emergency Department (HOSPITAL_COMMUNITY)
Admission: EM | Admit: 2016-02-05 | Discharge: 2016-02-05 | Disposition: A | Discharge status: Home / Self Care | Payer: Self-pay | Attending: Emergency Medicine | Admitting: Emergency Medicine

## 2016-02-05 VITALS — BP 120/82 | HR 71 | Temp 97.4°F | Ht 62.0 in | Wt 116.0 lb

## 2016-02-05 DIAGNOSIS — E860 Dehydration: Secondary | ICD-10-CM

## 2016-02-05 DIAGNOSIS — R197 Diarrhea, unspecified: Secondary | ICD-10-CM

## 2016-02-05 DIAGNOSIS — R152 Fecal urgency: Secondary | ICD-10-CM

## 2016-02-05 DIAGNOSIS — R112 Nausea with vomiting, unspecified: Secondary | ICD-10-CM | POA: Insufficient documentation

## 2016-02-05 DIAGNOSIS — K5100 Ulcerative (chronic) pancolitis without complications: Secondary | ICD-10-CM | POA: Insufficient documentation

## 2016-02-05 DIAGNOSIS — K529 Noninfective gastroenteritis and colitis, unspecified: Secondary | ICD-10-CM

## 2016-02-05 DIAGNOSIS — A09 Infectious gastroenteritis and colitis, unspecified: Secondary | ICD-10-CM

## 2016-02-05 DIAGNOSIS — Z79899 Other long term (current) drug therapy: Secondary | ICD-10-CM | POA: Insufficient documentation

## 2016-02-05 LAB — CBC WITH DIFFERENTIAL/PLATELET
BASOS ABS: 0 10*3/uL (ref 0.0–0.1)
Basophils Relative: 0 %
Eosinophils Absolute: 0 10*3/uL (ref 0.0–0.7)
Eosinophils Relative: 0 %
HEMATOCRIT: 37.7 % (ref 36.0–46.0)
HEMOGLOBIN: 11.7 g/dL — AB (ref 12.0–15.0)
LYMPHS PCT: 4 %
Lymphs Abs: 0.8 10*3/uL (ref 0.7–4.0)
MCH: 20.6 pg — ABNORMAL LOW (ref 26.0–34.0)
MCHC: 31 g/dL (ref 30.0–36.0)
MCV: 66.4 fL — ABNORMAL LOW (ref 78.0–100.0)
MONOS PCT: 6 %
Monocytes Absolute: 1.2 10*3/uL — ABNORMAL HIGH (ref 0.1–1.0)
Neutro Abs: 17.3 10*3/uL — ABNORMAL HIGH (ref 1.7–7.7)
Neutrophils Relative %: 90 %
Platelets: 258 10*3/uL (ref 150–400)
RBC: 5.68 MIL/uL — AB (ref 3.87–5.11)
RDW: 16.6 % — ABNORMAL HIGH (ref 11.5–15.5)
WBC: 19.3 10*3/uL — AB (ref 4.0–10.5)

## 2016-02-05 LAB — COMPREHENSIVE METABOLIC PANEL
ALBUMIN: 3.6 g/dL (ref 3.5–5.0)
ALK PHOS: 93 U/L (ref 38–126)
ALT: 16 U/L (ref 14–54)
AST: 34 U/L (ref 15–41)
Anion gap: 6 (ref 5–15)
BILIRUBIN TOTAL: 1.3 mg/dL — AB (ref 0.3–1.2)
BUN: 13 mg/dL (ref 6–20)
CALCIUM: 7.6 mg/dL — AB (ref 8.9–10.3)
CO2: 20 mmol/L — ABNORMAL LOW (ref 22–32)
Chloride: 111 mmol/L (ref 101–111)
Creatinine, Ser: 0.52 mg/dL (ref 0.44–1.00)
GFR calc Af Amer: >60 mL/min (ref >60)
GFR calc non Af Amer: >60 mL/min (ref >60)
GLUCOSE: 109 mg/dL — AB (ref 65–99)
POTASSIUM: 5.2 mmol/L — AB (ref 3.5–5.1)
Sodium: 137 mmol/L (ref 135–145)
TOTAL PROTEIN: 7.2 g/dL (ref 6.5–8.1)

## 2016-02-05 LAB — GASTROINTESTINAL PANEL BY PCR, STOOL (REPLACES STOOL CULTURE)
Adenovirus F40/41: NOT DETECTED
Astrovirus: NOT DETECTED
Campylobacter species: NOT DETECTED
Cryptosporidium: NOT DETECTED
Cyclospora cayetanensis: NOT DETECTED
E. COLI O157: NOT DETECTED
ENTAMOEBA HISTOLYTICA: NOT DETECTED
ENTEROAGGREGATIVE E COLI (EAEC): NOT DETECTED
ENTEROPATHOGENIC E COLI (EPEC): DETECTED — AB
ENTEROTOXIGENIC E COLI (ETEC): NOT DETECTED
Giardia lamblia: NOT DETECTED
NOROVIRUS GI/GII: NOT DETECTED
Plesimonas shigelloides: NOT DETECTED
Rotavirus A: NOT DETECTED
SALMONELLA SPECIES: NOT DETECTED
SAPOVIRUS (I, II, IV, AND V): NOT DETECTED
SHIGELLA/ENTEROINVASIVE E COLI (EIEC): NOT DETECTED
Shiga like toxin producing E coli (STEC): NOT DETECTED
VIBRIO CHOLERAE: NOT DETECTED
VIBRIO SPECIES: NOT DETECTED
Yersinia enterocolitica: NOT DETECTED

## 2016-02-05 LAB — POCT CBC
Granulocyte percent: 85.5 %G — AB (ref 37–80)
HEMATOCRIT: 36.8 % — AB (ref 37.7–47.9)
Hemoglobin: 12.1 g/dL — AB (ref 12.2–16.2)
LYMPH, POC: 2.7 (ref 0.6–3.4)
MCH, POC: 21.1 pg — AB (ref 27–31.2)
MCHC: 33 g/dL (ref 31.8–35.4)
MCV: 63.9 fL — AB (ref 80–97)
MID (CBC): 0.7 (ref 0–0.9)
MPV: 8.1 fL (ref 0–99.8)
PLATELET COUNT, POC: 401 10*3/uL (ref 142–424)
POC GRANULOCYTE: 20 — AB (ref 2–6.9)
POC LYMPH %: 11.7 % (ref 10–50)
POC MID %: 2.8 %M (ref 0–12)
RBC: 5.76 M/uL — AB (ref 4.04–5.48)
RDW, POC: 16.5 %
WBC: 23.4 10*3/uL — AB (ref 4.6–10.2)

## 2016-02-05 LAB — POC MICROSCOPIC URINALYSIS (UMFC): MUCUS RE: ABSENT

## 2016-02-05 LAB — POCT URINALYSIS DIP (MANUAL ENTRY)
Blood, UA: NEGATIVE
GLUCOSE UA: NEGATIVE
LEUKOCYTES UA: NEGATIVE
Nitrite, UA: NEGATIVE
Spec Grav, UA: >=1.03
UROBILINOGEN UA: 2
pH, UA: 5

## 2016-02-05 LAB — C DIFFICILE QUICK SCREEN W PCR REFLEX
C DIFFICILE (CDIFF) INTERP: NEGATIVE
C Diff antigen: NEGATIVE
C Diff toxin: NEGATIVE

## 2016-02-05 LAB — LIPASE, BLOOD: Lipase: 16 U/L (ref 11–51)

## 2016-02-05 MED ORDER — CIPROFLOXACIN HCL 500 MG PO TABS: 500.0000 mg | ORAL_TABLET | Freq: Two times a day (BID) | ORAL | Status: DC

## 2016-02-05 MED ORDER — DIATRIZOATE MEGLUMINE & SODIUM 66-10 % PO SOLN: 15.0000 mL | ORAL | Status: DC | PRN

## 2016-02-05 MED ORDER — ONDANSETRON HCL 4 MG/2ML IJ SOLN
4.0000 mg | Freq: Once | INTRAMUSCULAR | Status: AC
Start: 2016-02-05 — End: 2016-02-05
  Administered 2016-02-05: 4 mg via INTRAVENOUS
  Filled 2016-02-05: qty 2

## 2016-02-05 MED ORDER — METRONIDAZOLE 500 MG PO TABS: 500.0000 mg | ORAL_TABLET | Freq: Two times a day (BID) | ORAL | Status: DC

## 2016-02-05 MED ORDER — IOPAMIDOL (ISOVUE-300) INJECTION 61%
100.0000 mL | Freq: Once | INTRAVENOUS | Status: AC | PRN
  Administered 2016-02-05: 100 mL via INTRAVENOUS

## 2016-02-05 MED ORDER — ONDANSETRON 4 MG PO TBDP: 4.0000 mg | ORAL_TABLET | Freq: Three times a day (TID) | ORAL | Status: DC | PRN

## 2016-02-05 MED ORDER — DIATRIZOATE MEGLUMINE & SODIUM 66-10 % PO SOLN: 15.0000 mL | Freq: Once | ORAL | Status: DC

## 2016-02-05 MED ORDER — MORPHINE SULFATE (PF) 4 MG/ML IV SOLN
4.0000 mg | Freq: Once | INTRAVENOUS | Status: AC
  Administered 2016-02-05: 4 mg via INTRAVENOUS
  Filled 2016-02-05: qty 1

## 2016-02-05 MED ORDER — DICYCLOMINE HCL 20 MG PO TABS: 20.0000 mg | ORAL_TABLET | Freq: Two times a day (BID) | ORAL | Status: DC

## 2016-02-05 MED ORDER — SODIUM CHLORIDE 0.9 % IV BOLUS (SEPSIS)
1000.0000 mL | Freq: Once | INTRAVENOUS | Status: AC
  Administered 2016-02-05: 1000 mL via INTRAVENOUS

## 2016-02-05 NOTE — ED Notes (Signed)
Pt from Urgent Care on [address] with EMS with complaints of n/v/d x 4 days. Pt is alert and oriented and ambulatory. Pt has complaints of lower abdominal pain that is not sensitive to palpation. Pt does have an elevated white count per urgent care

## 2016-02-05 NOTE — Discharge Instructions (Signed)
Take the prescribed medication as directed.  Your stool has been sent for culture, if results are abnormal you will be contacted. Follow-up with GI physician-- call office to schedule appt. Return to the ED for new or worsening symptoms.

## 2016-02-05 NOTE — Progress Notes (Signed)
EDCM spoke to patient and patient's son  at bedside. Patient's son confirms patient does not have a pcp or insurance living in [place].  [facility] provided patient with contact information to [facility], informed patient of services there and walk in times.  EDCM also provided patient with list of pcps who accept self pay patients, list of discount pharmacies and websites needymeds.org and GoodRX.com for medication assistance, phone number to inquire about the orange card, phone number to inquire about Medicaid, phone number to inquire about the [employer], financial resources in the community such as local churches, [employer], urban ministries, and dental assistance for uninsured patients.  Patient thankfulf or resources.  No further EDCM needs at this time.  Patient's son reports patient is seen at the Urgent Care on [address] for medical needs.

## 2016-02-05 NOTE — ED Notes (Signed)
Pt ambulated to restroom. 

## 2016-02-05 NOTE — ED Notes (Signed)
Pt called, no answer. 1st attempt. 

## 2016-02-05 NOTE — ED Notes (Signed)
[facility] called with + GI Panel via PCR that was positive for Enteropathogenic Ecoli; spoke with Dr Roxanne Mins and he advised that pt was prescribed Cipro and Flagyl and should be covered and does not need any additional prescriptions

## 2016-02-05 NOTE — ED Provider Notes (Signed)
CSN: [id number]     Arrival date & time 02/05/16  1515 History   First MD Initiated Contact with Patient 02/05/16 1529     Chief Complaint  Patient presents with  . Nausea  . Emesis  . Diarrhea     (Consider location/radiation/quality/duration/timing/severity/associated sxs/prior Treatment) Patient is a 60 y.o. female presenting with vomiting and diarrhea. The history is provided by the patient and medical records.  Emesis Associated symptoms: abdominal pain and diarrhea   Diarrhea Associated symptoms: abdominal pain and vomiting     59 year old female with no significant past medical history presenting to the ED for nausea, vomiting, and diarrhea for the past 4 days. States vomiting has overall improved, but she continues having multiple bowel movements during the day. She estimates having diarrhea every 5 minutes. This is watery and nonbloody in nature. She denies any fever or chills. Reports some left lower abdominal pain. No GI history. No recent travel. No sick contacts. No antibiotics use. No prior abdominal surgeries.  Seen at urgent care earlier today with elevated WBC count and sent here for further management.  No meds given.  VSS.  History reviewed. No pertinent past medical history. History reviewed. No pertinent past surgical history. No family history on file. Social History  Substance Use Topics  . Smoking status: Never Smoker   . Smokeless tobacco: Never Used  . Alcohol Use: No   OB History    No data available     Review of Systems  Gastrointestinal: Positive for nausea, vomiting, abdominal pain and diarrhea.  All other systems reviewed and are negative.     Allergies  Review of patient's allergies indicates no known allergies.  Home Medications   Prior to Admission medications   Medication Sig Start Date End Date Taking? Authorizing Provider  acetaminophen (TYLENOL) 325 MG tablet Take 650 mg by mouth every 6 (six) hours as needed.    Historical  Provider, MD  ibuprofen (ADVIL,MOTRIN) 100 MG/5ML suspension Take 200 mg by mouth every 4 (four) hours as needed.    Historical Provider, MD   BP 153/85 mmHg  Pulse 78  Temp(Src) 97.7 F (36.5 C) (Oral)  Resp 18  SpO2 95%   Physical Exam  Constitutional: She is oriented to person, place, and time. She appears well-developed and well-nourished. No distress.  HENT:  Head: Normocephalic and atraumatic.  Mouth/Throat: Oropharynx is clear and moist.  Dry mucous membranes  Eyes: Conjunctivae and EOM are normal. Pupils are equal, round, and reactive to light.  Neck: Normal range of motion. Neck supple.  Cardiovascular: Normal rate, regular rhythm and normal heart sounds.   Pulmonary/Chest: Effort normal and breath sounds normal. No respiratory distress. She has no wheezes.  Abdominal: Soft. Bowel sounds are normal. There is tenderness in the left lower quadrant. There is guarding (Voluntary).  Abdomen soft, nondistended, tenderness in the left lower quadrant with voluntary guarding  Musculoskeletal: Normal range of motion.  Neurological: She is alert and oriented to person, place, and time.  Skin: Skin is warm and dry. She is not diaphoretic.  Psychiatric: She has a normal mood and affect.  Nursing note and vitals reviewed.   ED Course  Procedures (including critical care time) Labs Review Labs Reviewed  CBC WITH DIFFERENTIAL/PLATELET - Abnormal; Notable for the following:    WBC 19.3 (*)    RBC 5.68 (*)    Hemoglobin 11.7 (*)    MCV 66.4 (*)    MCH 20.6 (*)    RDW 16.6 (*)  Neutro Abs 17.3 (*)    Monocytes Absolute 1.2 (*)    All other components within normal limits  COMPREHENSIVE METABOLIC PANEL - Abnormal; Notable for the following:    Potassium 5.2 (*)    CO2 20 (*)    Glucose, Bld 109 (*)    Calcium 7.6 (*)    Total Bilirubin 1.3 (*)    All other components within normal limits  C DIFFICILE QUICK SCREEN W PCR REFLEX  GASTROINTESTINAL PANEL BY PCR, STOOL (REPLACES  STOOL CULTURE)  LIPASE, BLOOD    Imaging Review Ct Abdomen Pelvis W Contrast  02/05/2016  CLINICAL DATA:  Pain 60 year old female with abdominal pain, nausea vomiting diarrhea. EXAM: CT ABDOMEN AND PELVIS WITH CONTRAST TECHNIQUE: Multidetector CT imaging of the abdomen and pelvis was performed using the standard protocol following bolus administration of intravenous contrast. CONTRAST:  1100mL ISOVUE-300 IOPAMIDOL (ISOVUE-300) INJECTION 61% COMPARISON:  Abdominal radiograph dated 05/14/2015 and CT dated 06/29/2010 FINDINGS: The visualized lung bases are clear. There is a 9 mm pleural based calcific focus at the right lung base, possibly an old granuloma. There is no intra-abdominal free air. Small free fluid within the pelvis. The liver, gallbladder, pancreas, spleen, adrenal glands appear unremarkable there subcentimeter right renal hypodense lesion is too small to characterize but likely represents a cyst. This is stable compared to the prior study. There is no hydronephrosis on either side. The visualized ureters and urinary bladder appear unremarkable. The uterus and ovaries are grossly unremarkable. There is diffuse inflammatory changes and mural thickening of the colon compatible with pancolitis. The pattern of involvement is concerning for pseudomembranous colitis. Correlation with history of recent antibiotic use and stool cultures recommended. Liquid stool noted throughout the colon compatible with diarrheal state. There is no evidence of bowel obstruction. Normal appendix. The abdominal aorta and IVC appear unremarkable. The origins of the celiac axis, SMA, IMA as well as the origins of the renal arteries are patent. No portal venous gas identified. The SMV, splenic vein, and main portal veins are patent. There is no adenopathy. The abdominal wall soft tissues appear unremarkable. There is mild osteopenia with degenerative changes of spine. No acute fracture IMPRESSION: Pancolitis likely C.diff  colitis. Correlation with clinical exam, history of recent antibiotic use and stool cultures recommended. Electronically Signed   By: Anner Crete M.D.   On: 02/05/2016 18:38   I have personally reviewed and evaluated these images and lab results as part of my medical decision-making.   EKG Interpretation None      MDM   Final diagnoses:  Colitis  Nausea vomiting and diarrhea   60 year old female here from urgent care for nausea, vomiting, diarrhea. This is been ongoing for 4 days. Reports multiple loose stools today, watery in nature. Patient is afebrile, nontoxic.  Her abdomen is soft, she does have some tenderness in her left lower quadrant.  Labs reviewed from urgent care, patient with significant leukocytosis. Will repeat labs here and obtain CT scan. Patient was given IV fluids, morphine, Zofran.  Labwork with leukocytosis of 19,000. Remainder of labs are reassuring. CT scan with evidence of pancolitis, questionable C. difficile. Patient has not had any recent sick contacts, travel, or antibiotics use. She was able to provide a small stool sample but did not have any significant diarrhea or vomiting here in the ED. C.diff PCR and stool culture pending.  She remains hemodynamically stable.  Will start empirically on cipro/flagyl for coverage of c.diff and/or other possible infectious colitis.  Encouraged to  follow-up with GI.  Discussed plan with patient, he/she acknowledged understanding and agreed with plan of care.  Return precautions given for new or worsening symptoms.  Case discussed with attending physician, Dr. Roderic Palau, who agrees with assessment and plan of care.  Larene Pickett, PA-C 02/05/16 2035  Milton Ferguson, MD 02/05/16 [phone]

## 2016-02-05 NOTE — Progress Notes (Signed)
02/05/2016 2:36 PM   DOB: June 19, 1956 / MRN: [id number]  SUBJECTIVE:  Elizabeth Saunders is a 60 y.o. female presenting for diarrhea that has been present for 5 days now.  Says that she needs to have a bowel movent every few minutes.  Denies fever, chills, nausea, blood in the stool. Has been using immodium with some relief. She associates generalized abdominal pain.  She feels that she is getting worse.   She has No Known Allergies.   She  has no past medical history on file.    She  reports that she has never smoked. She has never used smokeless tobacco. She reports that she does not drink alcohol or use illicit drugs. She  has no sexual activity history on file. The patient  has no past surgical history on file.  Her family history is not on file.  Review of Systems  Constitutional: Negative for fever.  Cardiovascular: Negative for chest pain and leg swelling.  Gastrointestinal: Positive for abdominal pain and diarrhea. Negative for constipation and blood in stool.  Genitourinary: Negative for dysuria.  Musculoskeletal: Negative for myalgias.  Neurological: Negative for dizziness and headaches.    Problem list and medications reviewed and updated by myself where necessary, and exist elsewhere in the encounter.   OBJECTIVE:  BP 120/82 mmHg  Pulse 71  Temp(Src) 97.4 F (36.3 C) (Oral)  Ht 5\' 2"  (1.575 m)  Wt 116 lb (52.617 kg)  BMI 21.21 kg/m2  SpO2 98%  Physical Exam  Constitutional: She is oriented to person, place, and time. She appears well-nourished. No distress.  Eyes: EOM are normal. Pupils are equal, round, and reactive to light.  Cardiovascular: Normal rate and regular rhythm.   Pulmonary/Chest: Effort normal and breath sounds normal.  Abdominal: Bowel sounds are normal. She exhibits no distension and no mass. There is tenderness (diffuse). There is no rebound and no guarding.  Neurological: She is alert and oriented to person, place, and time. No cranial nerve deficit. Gait  normal.  Skin: Skin is dry. She is not diaphoretic.  Psychiatric: She has a normal mood and affect.  Vitals reviewed.   Wt Readings from Last 3 Encounters:  02/05/16 116 lb (52.617 kg)  05/14/15 119 lb (53.978 kg)  07/12/12 116 lb 3.2 oz (52.708 kg)   BP Readings from Last 3 Encounters:  02/05/16 120/82  05/14/15 118/76  07/12/12 124/72   CBC Latest Ref Rng 02/05/2016 05/14/2015 07/12/2012  WBC 4.6 - 10.2 K/uL 23.4(A) 8.3 7.0  Hemoglobin 12.2 - 16.2 g/dL 12.1(A) 10.8(A) 10.4(A)  Hematocrit 37.7 - 47.9 % 36.8(A) 36.2(A) 35.9(A)  Platelets 150 - 400 K/uL - - -    Results for orders placed or performed in visit on 02/05/16 (from the past 72 hour(s))  POCT CBC     Status: Abnormal   Collection Time: 02/05/16  2:15 PM  Result Value Ref Range   WBC 23.4 (A) 4.6 - 10.2 K/uL   Lymph, poc 2.7 0.6 - 3.4   POC LYMPH PERCENT 11.7 10 - 50 %L   MID (cbc) 0.7 0 - 0.9   POC MID % 2.8 0 - 12 %M   POC Granulocyte 20.0 (A) 2 - 6.9   Granulocyte percent 85.5 (A) 37 - 80 %G   RBC 5.76 (A) 4.04 - 5.48 M/uL   Hemoglobin 12.1 (A) 12.2 - 16.2 g/dL   HCT, POC 36.8 (A) 37.7 - 47.9 %   MCV 63.9 (A) 80 - 97 fL   MCH, POC  21.1 (A) 27 - 31.2 pg   MCHC 33.0 31.8 - 35.4 g/dL   RDW, POC 16.5 %   Platelet Count, POC 401 142 - 424 K/uL   MPV 8.1 0 - 99.8 fL  POCT urinalysis dipstick     Status: Abnormal   Collection Time: 02/05/16  2:24 PM  Result Value Ref Range   Color, UA yellow yellow   Clarity, UA clear clear   Glucose, UA negative negative   Bilirubin, UA moderate (A) negative   Ketones, POC UA small (15) (A) negative   Spec Grav, UA >=1.030    Blood, UA negative negative   pH, UA 5.0    Protein Ur, POC =100 (A) negative   Urobilinogen, UA 2.0    Nitrite, UA Negative Negative   Leukocytes, UA Negative Negative  POCT Microscopic Urinalysis ([facility])     Status: None   Collection Time: 02/05/16  2:24 PM  Result Value Ref Range   WBC,UR,HPF,POC None None WBC/hpf   RBC,UR,HPF,POC None None  RBC/hpf   Bacteria None None, Too numerous to count   Mucus Absent Absent   Epithelial Cells, UR Per Microscopy None None, Too numerous to count cells/hpf    No results found.  ASSESSMENT AND PLAN  Elizabeth Saunders was seen today for diarrhea, fatigue, generalized abdomen pain and no food intake.  Diagnoses and all orders for this visit:  Dehydration: She has not been feeling well for 5 days.  She has a large leukocytosis with a left shift.  I had planned on getting a CT however if this is negative I am stuck waiting on labs while she may deteriorate.  He differential is broad at this point.  Electing to send her to the ED so she may be monitored while the differential is being explored.    Fecal urgency  Diarrhea of presumed infectious origin -     POCT CBC -     POCT urinalysis dipstick -     POCT Microscopic Urinalysis ([facility])     The patient was advised to call or return to clinic if she does not see an improvement in symptoms or to seek the care of the closest emergency department if she worsens with the above plan.   Philis Fendt, MHS, PA-C [facility] 02/05/2016 2:36 PM

## 2016-02-05 NOTE — Patient Instructions (Signed)
     IF you received an x-ray today, you will receive an invoice from [facility]. Please contact [facility] at [phone] with questions or concerns regarding your invoice.   IF you received labwork today, you will receive an invoice from [facility]. Please contact [facility] at [phone] with questions or concerns regarding your invoice.   Our billing staff will not be able to assist you with questions regarding bills from these companies.  You will be contacted with the lab results as soon as they are available. The fastest way to get your results is to activate your My Chart account. Instructions are located on the last page of this paperwork. If you have not heard from us regarding the results in 2 weeks, please contact this office.      

## 2017-12-16 ENCOUNTER — Emergency Department (HOSPITAL_COMMUNITY): Payer: Medicaid Other

## 2017-12-16 ENCOUNTER — Other Ambulatory Visit: Payer: Self-pay

## 2017-12-16 ENCOUNTER — Encounter (HOSPITAL_COMMUNITY): Payer: Self-pay

## 2017-12-16 ENCOUNTER — Inpatient Hospital Stay (HOSPITAL_COMMUNITY)
Admission: EM | Admit: 2017-12-16 | Discharge: 2017-12-22 | DRG: 330 | Disposition: A | Discharge status: Home Health | Payer: Medicaid Other | Attending: Internal Medicine | Admitting: Internal Medicine

## 2017-12-16 DIAGNOSIS — K9289 Other specified diseases of the digestive system: Secondary | ICD-10-CM | POA: Diagnosis not present

## 2017-12-16 DIAGNOSIS — C19 Malignant neoplasm of rectosigmoid junction: Secondary | ICD-10-CM | POA: Diagnosis present

## 2017-12-16 DIAGNOSIS — K56691 Other complete intestinal obstruction: Secondary | ICD-10-CM | POA: Diagnosis not present

## 2017-12-16 DIAGNOSIS — C779 Secondary and unspecified malignant neoplasm of lymph node, unspecified: Secondary | ICD-10-CM | POA: Diagnosis not present

## 2017-12-16 DIAGNOSIS — Z9889 Other specified postprocedural states: Secondary | ICD-10-CM

## 2017-12-16 DIAGNOSIS — K56609 Unspecified intestinal obstruction, unspecified as to partial versus complete obstruction: Secondary | ICD-10-CM | POA: Diagnosis present

## 2017-12-16 DIAGNOSIS — C189 Malignant neoplasm of colon, unspecified: Secondary | ICD-10-CM | POA: Diagnosis not present

## 2017-12-16 DIAGNOSIS — Z87828 Personal history of other (healed) physical injury and trauma: Secondary | ICD-10-CM

## 2017-12-16 DIAGNOSIS — R630 Anorexia: Secondary | ICD-10-CM | POA: Diagnosis present

## 2017-12-16 DIAGNOSIS — C20 Malignant neoplasm of rectum: Secondary | ICD-10-CM | POA: Diagnosis present

## 2017-12-16 DIAGNOSIS — K5900 Constipation, unspecified: Secondary | ICD-10-CM | POA: Diagnosis not present

## 2017-12-16 DIAGNOSIS — E876 Hypokalemia: Secondary | ICD-10-CM | POA: Diagnosis not present

## 2017-12-16 DIAGNOSIS — K56600 Partial intestinal obstruction, unspecified as to cause: Secondary | ICD-10-CM | POA: Diagnosis not present

## 2017-12-16 DIAGNOSIS — R634 Abnormal weight loss: Secondary | ICD-10-CM | POA: Diagnosis present

## 2017-12-16 DIAGNOSIS — Z933 Colostomy status: Secondary | ICD-10-CM | POA: Diagnosis not present

## 2017-12-16 DIAGNOSIS — Z681 Body mass index (BMI) 19 or less, adult: Secondary | ICD-10-CM

## 2017-12-16 DIAGNOSIS — R933 Abnormal findings on diagnostic imaging of other parts of digestive tract: Secondary | ICD-10-CM | POA: Diagnosis present

## 2017-12-16 DIAGNOSIS — R109 Unspecified abdominal pain: Secondary | ICD-10-CM | POA: Diagnosis present

## 2017-12-16 DIAGNOSIS — Z9049 Acquired absence of other specified parts of digestive tract: Secondary | ICD-10-CM | POA: Diagnosis not present

## 2017-12-16 DIAGNOSIS — D509 Iron deficiency anemia, unspecified: Secondary | ICD-10-CM | POA: Diagnosis present

## 2017-12-16 DIAGNOSIS — C775 Secondary and unspecified malignant neoplasm of intrapelvic lymph nodes: Secondary | ICD-10-CM | POA: Diagnosis present

## 2017-12-16 DIAGNOSIS — D649 Anemia, unspecified: Secondary | ICD-10-CM

## 2017-12-16 DIAGNOSIS — R197 Diarrhea, unspecified: Secondary | ICD-10-CM | POA: Diagnosis not present

## 2017-12-16 DIAGNOSIS — R011 Cardiac murmur, unspecified: Secondary | ICD-10-CM | POA: Diagnosis present

## 2017-12-16 DIAGNOSIS — R1032 Left lower quadrant pain: Secondary | ICD-10-CM

## 2017-12-16 DIAGNOSIS — C187 Malignant neoplasm of sigmoid colon: Secondary | ICD-10-CM | POA: Diagnosis not present

## 2017-12-16 DIAGNOSIS — R51 Headache: Secondary | ICD-10-CM | POA: Diagnosis present

## 2017-12-16 HISTORY — DX: Anemia, unspecified: D64.9

## 2017-12-16 LAB — COMPREHENSIVE METABOLIC PANEL
ALT: 9 U/L — ABNORMAL LOW (ref 14–54)
AST: 17 U/L (ref 15–41)
Albumin: 3.5 g/dL (ref 3.5–5.0)
Alkaline Phosphatase: 94 U/L (ref 38–126)
Anion gap: 8 (ref 5–15)
BUN: 10 mg/dL (ref 6–20)
CHLORIDE: 108 mmol/L (ref 101–111)
CO2: 21 mmol/L — ABNORMAL LOW (ref 22–32)
CREATININE: 0.51 mg/dL (ref 0.44–1.00)
Calcium: 8.3 mg/dL — ABNORMAL LOW (ref 8.9–10.3)
Glucose, Bld: 117 mg/dL — ABNORMAL HIGH (ref 65–99)
Potassium: 3.8 mmol/L (ref 3.5–5.1)
Sodium: 137 mmol/L (ref 135–145)
Total Bilirubin: 0.5 mg/dL (ref 0.3–1.2)
Total Protein: 7.9 g/dL (ref 6.5–8.1)

## 2017-12-16 LAB — CBC
HCT: 20.6 % — ABNORMAL LOW (ref 36.0–46.0)
Hemoglobin: 5.4 g/dL — CL (ref 12.0–15.0)
MCH: 11.7 pg — ABNORMAL LOW (ref 26.0–34.0)
MCHC: 26.2 g/dL — ABNORMAL LOW (ref 30.0–36.0)
MCV: 44.8 fL — AB (ref 78.0–100.0)
PLATELETS: 371 10*3/uL (ref 150–400)
RBC: 4.6 MIL/uL (ref 3.87–5.11)
RDW: 22 % — ABNORMAL HIGH (ref 11.5–15.5)
WBC: 6 10*3/uL (ref 4.0–10.5)

## 2017-12-16 LAB — RETICULOCYTES
RBC.: 4.32 MIL/uL (ref 3.87–5.11)
Retic Count, Absolute: 34.6 10*3/uL (ref 19.0–186.0)
Retic Ct Pct: 0.8 % (ref 0.4–3.1)

## 2017-12-16 LAB — URINALYSIS, ROUTINE W REFLEX MICROSCOPIC
Bilirubin Urine: NEGATIVE
GLUCOSE, UA: NEGATIVE mg/dL
Hgb urine dipstick: NEGATIVE
Ketones, ur: NEGATIVE mg/dL
LEUKOCYTES UA: NEGATIVE
Nitrite: NEGATIVE
PROTEIN: NEGATIVE mg/dL
Specific Gravity, Urine: 1.018 (ref 1.005–1.030)
pH: 5 (ref 5.0–8.0)

## 2017-12-16 LAB — POC OCCULT BLOOD, ED: Fecal Occult Bld: NEGATIVE

## 2017-12-16 LAB — IRON AND TIBC
Iron: 11 ug/dL — ABNORMAL LOW (ref 28–170)
Saturation Ratios: 2 % — ABNORMAL LOW (ref 10.4–31.8)
TIBC: 517 ug/dL — ABNORMAL HIGH (ref 250–450)
UIBC: 506 ug/dL

## 2017-12-16 LAB — PREPARE RBC (CROSSMATCH)

## 2017-12-16 LAB — FERRITIN: Ferritin: 2 ng/mL — ABNORMAL LOW (ref 11–307)

## 2017-12-16 LAB — HEMOGLOBIN AND HEMATOCRIT, BLOOD
HCT: 27.7 % — ABNORMAL LOW (ref 36.0–46.0)
Hemoglobin: 7.9 g/dL — ABNORMAL LOW (ref 12.0–15.0)

## 2017-12-16 LAB — LIPASE, BLOOD: LIPASE: 36 U/L (ref 11–51)

## 2017-12-16 LAB — ABO/RH: ABO/RH(D): AB POS

## 2017-12-16 MED ORDER — KETOROLAC TROMETHAMINE 30 MG/ML IJ SOLN
30.0000 mg | Freq: Once | INTRAMUSCULAR | Status: AC
  Administered 2017-12-16: 30 mg via INTRAVENOUS
  Filled 2017-12-16: qty 1

## 2017-12-16 MED ORDER — ENOXAPARIN SODIUM 40 MG/0.4ML ~~LOC~~ SOLN
40.0000 mg | SUBCUTANEOUS | Status: DC
  Administered 2017-12-16: 40 mg via SUBCUTANEOUS
  Filled 2017-12-16 (×3): qty 0.4

## 2017-12-16 MED ORDER — KETOROLAC TROMETHAMINE 30 MG/ML IJ SOLN
15.0000 mg | Freq: Four times a day (QID) | INTRAMUSCULAR | Status: DC | PRN
  Administered 2017-12-16: 15 mg via INTRAVENOUS
  Filled 2017-12-16: qty 1

## 2017-12-16 MED ORDER — SODIUM CHLORIDE 0.9 % IV SOLN: Freq: Once | INTRAVENOUS | Status: DC

## 2017-12-16 MED ORDER — SODIUM CHLORIDE 0.9 % IV BOLUS
1000.0000 mL | Freq: Once | INTRAVENOUS | Status: AC
  Administered 2017-12-16: 1000 mL via INTRAVENOUS

## 2017-12-16 MED ORDER — IOPAMIDOL (ISOVUE-300) INJECTION 61%
INTRAVENOUS | Status: AC
  Administered 2017-12-16: 100 mL
  Filled 2017-12-16: qty 100

## 2017-12-16 MED ORDER — SODIUM CHLORIDE 0.9 % IV SOLN
INTRAVENOUS | Status: AC
  Administered 2017-12-16: 20:00:00 via INTRAVENOUS
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
## 2017-12-16 NOTE — ED Notes (Signed)
Walked patient to bathroom, and once done placed her back on the cardiac monitor. She has now been taken to CT.

## 2017-12-16 NOTE — ED Notes (Signed)
Lab called critical value of Hgb 5.4 - Dr. Melina Copa notified - T&S drawn - sent to lab

## 2017-12-16 NOTE — ED Triage Notes (Signed)
Pt reports that she has been having lower abd pain X1 month that has been worsening over the past 3 days. Pt denies NVD.
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
## 2017-12-16 NOTE — ED Notes (Signed)
Dr. Melina Copa in to assess - orders given

## 2017-12-16 NOTE — ED Provider Notes (Signed)
[facility] EMERGENCY DEPARTMENT Provider Note   CSN: [id number] Arrival date & time: 12/16/17  9485     History   Chief Complaint Chief Complaint  Patient presents with  . Abdominal Pain    HPI Elizabeth Saunders is a 62 y.o. female.  She is brought in by her son for over a years worth of intermittent abdominal pain.  The episodes are becoming more frequent and now been constant for at least a month.  They are primarily in the lower abdomen more to the left side.  They are associated with anorexia and weight loss although unable to quantify.  There is been no fever no vomiting no diarrhea no urinary symptoms no vaginal bleeding or discharge.  She states when the pain is strong she also gets a headache.  She has had no prior surgical history.  She states she went to the pulmonary clinic and then was referred to [facility] long about a year ago where they did a CAT scan and did not find a cause for her symptoms.  She has no primary care doctor does not get any preventative care or testing.  She is only tried over-the-counter medicine to help with the symptoms and they do not help at all.  The history is provided by the patient and a relative. The history is limited by a language barrier. A language interpreter was used (family - per their request).  Abdominal Pain   This is a recurrent problem. The current episode started more than 1 week ago. The problem occurs constantly. The problem has not changed since onset.The pain is associated with an unknown factor. The pain is located in the LLQ. The pain is moderate. Associated symptoms include anorexia and headaches. Pertinent negatives include fever, diarrhea, hematochezia, melena, nausea, vomiting, constipation, dysuria, frequency, hematuria and arthralgias. Nothing aggravates the symptoms. Nothing relieves the symptoms. Past workup includes CT scan. Her past medical history does not include PUD, gallstones, GERD, ulcerative colitis, Crohn's  disease or irritable bowel syndrome.    Past Medical History:  Diagnosis Date  . Anemia 12/16/2017    Patient Active Problem List   Diagnosis Date Noted  . Microcytic anemia 12/16/2017  . Abdominal pain 12/16/2017    History reviewed. No pertinent surgical history.   OB History   None      Home Medications    Prior to Admission medications   Medication Sig Start Date End Date Taking? Authorizing Provider  ciprofloxacin (CIPRO) 500 MG tablet Take 1 tablet (500 mg total) by mouth every 12 (twelve) hours. 02/05/16   Larene Pickett, PA-C  dicyclomine (BENTYL) 20 MG tablet Take 1 tablet (20 mg total) by mouth 2 (two) times daily. 02/05/16   Larene Pickett, PA-C  metroNIDAZOLE (FLAGYL) 500 MG tablet Take 1 tablet (500 mg total) by mouth 2 (two) times daily. 02/05/16   Larene Pickett, PA-C  ondansetron (ZOFRAN ODT) 4 MG disintegrating tablet Take 1 tablet (4 mg total) by mouth every 8 (eight) hours as needed for nausea. 02/05/16   Larene Pickett, PA-C    Family History No family history on file.  Social History Social History   Tobacco Use  . Smoking status: Never Smoker  . Smokeless tobacco: Never Used  Substance Use Topics  . Alcohol use: No    Alcohol/week: 0.0 oz  . Drug use: No     Allergies   Patient has no known allergies.   Review of Systems Review of Systems  Constitutional: Positive for unexpected weight change. Negative for chills and fever.  HENT: Negative for ear pain and sore throat.   Eyes: Negative for pain and visual disturbance.  Respiratory: Negative for cough and shortness of breath.   Cardiovascular: Negative for chest pain and palpitations.  Gastrointestinal: Positive for abdominal pain and anorexia. Negative for constipation, diarrhea, hematochezia, melena, nausea and vomiting.  Genitourinary: Negative for dysuria, frequency and hematuria.  Musculoskeletal: Negative for arthralgias and back pain.  Skin: Negative for color change and rash.    Neurological: Positive for headaches. Negative for seizures and syncope.  All other systems reviewed and are negative.    Physical Exam Updated Vital Signs BP 139/66 (BP Location: Right Arm)   Pulse 74   Temp 98.2 F (36.8 C) (Oral)   Resp 14   Ht 5\' 2"  (1.575 m)   Wt 54.4 kg (120 lb)   SpO2 100%   BMI 21.95 kg/m   Physical Exam  Constitutional: She appears well-developed and well-nourished. No distress.  HENT:  Head: Normocephalic and atraumatic.  Eyes: Conjunctivae are normal.  Neck: Neck supple.  Cardiovascular: Normal rate and regular rhythm.  No murmur heard. Pulmonary/Chest: Effort normal and breath sounds normal. No respiratory distress.  Abdominal: Soft. She exhibits mass (llq). There is tenderness in the left lower quadrant. There is no rigidity, no rebound and no guarding. No hernia.  Musculoskeletal: Normal range of motion. She exhibits no edema, tenderness or deformity.  Neurological: She is alert.  Skin: Skin is warm and dry. Capillary refill takes less than 2 seconds.  Psychiatric: She has a normal mood and affect.  Nursing note and vitals reviewed.    ED Treatments / Results  Labs (all labs ordered are listed, but only abnormal results are displayed) Labs Reviewed  COMPREHENSIVE METABOLIC PANEL - Abnormal; Notable for the following components:      Result Value   CO2 21 (*)    Glucose, Bld 117 (*)    Calcium 8.3 (*)    ALT 9 (*)    All other components within normal limits  CBC - Abnormal; Notable for the following components:   Hemoglobin 5.4 (*)    HCT 20.6 (*)    MCV 44.8 (*)    MCH 11.7 (*)    MCHC 26.2 (*)    RDW 22.0 (*)    All other components within normal limits  IRON AND TIBC - Abnormal; Notable for the following components:   Iron 11 (*)    TIBC 517 (*)    Saturation Ratios 2 (*)    All other components within normal limits  FERRITIN - Abnormal; Notable for the following components:   Ferritin 2 (*)    All other components  within normal limits  LIPASE, BLOOD  URINALYSIS, ROUTINE W REFLEX MICROSCOPIC  RETICULOCYTES  HIV ANTIBODY (ROUTINE TESTING)  COMPREHENSIVE METABOLIC PANEL  CBC  HEMOGLOBIN A1C  POC OCCULT BLOOD, ED  TYPE AND SCREEN  ABO/RH  PREPARE RBC (CROSSMATCH)    EKG None  Radiology Ct Abdomen Pelvis W Contrast  Result Date: 12/16/2017 CLINICAL DATA:  Pt c/o LLQ pain feels like a large knot that is cramping and contracting pt says her tongue is also numb and rt foot EXAM: CT ABDOMEN AND PELVIS WITH CONTRAST TECHNIQUE: Multidetector CT imaging of the abdomen and pelvis was performed using the standard protocol following bolus administration of intravenous contrast. CONTRAST:  124mL ISOVUE-300 IOPAMIDOL (ISOVUE-300) INJECTION 61% COMPARISON:  02/05/2016 and previous FINDINGS: Lower chest: Partially  calcified right pleural plaque. Hepatobiliary: No focal liver abnormality is seen. No gallstones, gallbladder wall thickening, or biliary dilatation. Pancreas: Unremarkable. No pancreatic ductal dilatation or surrounding inflammatory changes. Spleen: Normal in size without focal abnormality. Adrenals/Urinary Tract: Normal adrenals. Stable subcentimeter probable cyst in the mid right kidney. No hydronephrosis or nephrolithiasis. Urinary bladder nondistended. Stomach/Bowel: Stomach is nondistended. Small bowel decompressed. Appendix not discretely identified. Large amount of fecal material and gas distending the colon. There is mild circumferential wall thickening in the splenic flexure, descending and proximal sigmoid segments without significant adjacent inflammatory/edematous change or abscess. Metallic intraluminal linear densities near the rectosigmoid junction, a site of relative narrowing. The rectum is decompressed. Vascular/Lymphatic: Mild Aortic Atherosclerosis (ICD10-170.0) without aneurysm. No abdominal or pelvic adenopathy. Portal vein patent. Reproductive: Uterus and bilateral adnexa are unremarkable.  Other: No ascites.  No free air. Musculoskeletal: No acute or significant osseous findings. IMPRESSION: 1. 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 There are associated metallic linear density intraluminally at this level, which were not present on prior study of 02/05/2016, suggesting interval surgical or colonoscopic intervention versus foreign body. 2. Wall thickening in the descending and sigmoid colon suggesting colitis, possible stercoral given the apparent rectosigmoid stricture. Electronically Signed   By: Lucrezia Europe M.D.   On: 12/16/2017 11:27    Procedures Procedures (including critical care time)  Medications Ordered in ED Medications  sodium chloride 0.9 % bolus 1,000 mL (has no administration in time range)  ketorolac (TORADOL) 30 MG/ML injection 30 mg (has no administration in time range)     Initial Impression / Assessment and Plan / ED Course  I have reviewed the triage vital signs and the nursing notes.  Pertinent labs & imaging results that were available during my care of the patient were reviewed by me and considered in my medical decision making (see chart for details).  Clinical Course as of Dec 16 1749  Sat Dec 17, 3162  3160 62 year old female with no prior preventative healthcare here with over a years worth of lower abdominal pain that is been constant for a month.  It has been associated with some weight loss.  She is got a soft abdomen but is tender in the left lower quadrant.  Differential would include diverticulitis, colon cancer, gynecologic malignancy.  We are checking some screening labs and a CT.   [MB]  1136 Rectal with chaperone technician.  Normal sphincter tone no masses.  No stool in vault.  Sample sent for guaiac to lab.  GI paged for consult.   [MB]  5631 Discussed with the GI attending who will evaluate the patient in the emergency department.  I have also paged the unassigned for admission.   [MB]  4970 Patient was seen by  GI who feel that after the patient gets transfused that would start prepping her for a scope tomorrow.   [MB]    Clinical Course User Index [MB] Hayden Rasmussen, MD    Final Clinical Impressions(s) / ED Diagnoses   Final diagnoses:  Left lower quadrant pain  Anemia, unspecified type    ED Discharge Orders    None       Hayden Rasmussen, MD 12/16/17 1754

## 2017-12-16 NOTE — ED Notes (Signed)
Helped patient to the bathroom and placed her back on the monitor.

## 2017-12-16 NOTE — ED Notes (Signed)
Patient is resting comfortably. 

## 2017-12-16 NOTE — ED Notes (Signed)
Family at bedside. 

## 2017-12-16 NOTE — ED Notes (Signed)
Vital signs stable. 

## 2017-12-16 NOTE — ED Notes (Signed)
VSS; resting quietly on stretcher with family at bedside; 1st unit RBCs infusing per pump without difficulty
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
## 2017-12-17 ENCOUNTER — Encounter (HOSPITAL_COMMUNITY): Payer: Self-pay | Admitting: Gastroenterology

## 2017-12-17 ENCOUNTER — Encounter (HOSPITAL_COMMUNITY)
Admission: EM | Disposition: A | Discharge status: Home Health | Payer: Self-pay | Source: Home / Self Care | Attending: Internal Medicine

## 2017-12-17 ENCOUNTER — Inpatient Hospital Stay (HOSPITAL_COMMUNITY): Payer: Medicaid Other

## 2017-12-17 DIAGNOSIS — K56600 Partial intestinal obstruction, unspecified as to cause: Secondary | ICD-10-CM

## 2017-12-17 DIAGNOSIS — E876 Hypokalemia: Secondary | ICD-10-CM

## 2017-12-17 DIAGNOSIS — D509 Iron deficiency anemia, unspecified: Secondary | ICD-10-CM | POA: Diagnosis present

## 2017-12-17 DIAGNOSIS — K9289 Other specified diseases of the digestive system: Secondary | ICD-10-CM

## 2017-12-17 DIAGNOSIS — K56691 Other complete intestinal obstruction: Secondary | ICD-10-CM

## 2017-12-17 DIAGNOSIS — C187 Malignant neoplasm of sigmoid colon: Secondary | ICD-10-CM

## 2017-12-17 DIAGNOSIS — K56609 Unspecified intestinal obstruction, unspecified as to partial versus complete obstruction: Secondary | ICD-10-CM | POA: Diagnosis present

## 2017-12-17 HISTORY — PX: FLEXIBLE SIGMOIDOSCOPY: SHX5431

## 2017-12-17 LAB — TYPE AND SCREEN
ABO/RH(D): AB POS
Antibody Screen: NEGATIVE
UNIT DIVISION: 0
UNIT DIVISION: 0

## 2017-12-17 LAB — BPAM RBC
Blood Product Expiration Date: 201905172359
Blood Product Expiration Date: 201905172359
ISSUE DATE / TIME: 201904201743
ISSUE DATE / TIME: 201904201600
UNIT TYPE AND RH: 8400
Unit Type and Rh: 8400

## 2017-12-17 LAB — HEMOGLOBIN A1C
Hgb A1c MFr Bld: 5.3 % (ref 4.8–5.6)
Mean Plasma Glucose: 105.41 mg/dL

## 2017-12-17 LAB — BASIC METABOLIC PANEL
Anion gap: 10 (ref 5–15)
BUN: 10 mg/dL (ref 6–20)
CO2: 19 mmol/L — ABNORMAL LOW (ref 22–32)
Calcium: 8.3 mg/dL — ABNORMAL LOW (ref 8.9–10.3)
Chloride: 111 mmol/L (ref 101–111)
Creatinine, Ser: 0.47 mg/dL (ref 0.44–1.00)
GFR calc Af Amer: >60 mL/min (ref >60)
GFR calc non Af Amer: >60 mL/min (ref >60)
Glucose, Bld: 94 mg/dL (ref 65–99)
Potassium: 3.4 mmol/L — ABNORMAL LOW (ref 3.5–5.1)
Sodium: 140 mmol/L (ref 135–145)

## 2017-12-17 LAB — HIV ANTIBODY (ROUTINE TESTING W REFLEX): HIV Screen 4th Generation wRfx: NONREACTIVE

## 2017-12-17 LAB — CBC
HCT: 28 % — ABNORMAL LOW (ref 36.0–46.0)
Hemoglobin: 8 g/dL — ABNORMAL LOW (ref 12.0–15.0)
MCH: 15 pg — ABNORMAL LOW (ref 26.0–34.0)
MCHC: 28.6 g/dL — ABNORMAL LOW (ref 30.0–36.0)
MCV: 52.4 fL — ABNORMAL LOW (ref 78.0–100.0)
Platelets: 313 10*3/uL (ref 150–400)
RBC: 5.34 MIL/uL — ABNORMAL HIGH (ref 3.87–5.11)
RDW: 33 % — ABNORMAL HIGH (ref 11.5–15.5)
WBC: 6 10*3/uL (ref 4.0–10.5)

## 2017-12-17 SURGERY — SIGMOIDOSCOPY, FLEXIBLE
Anesthesia: Moderate Sedation

## 2017-12-17 MED ORDER — FENTANYL CITRATE (PF) 100 MCG/2ML IJ SOLN
INTRAMUSCULAR | Status: DC | PRN
  Administered 2017-12-17: 25 ug via INTRAVENOUS

## 2017-12-17 MED ORDER — SPOT INK MARKER SYRINGE KIT
PACK | SUBMUCOSAL | Status: AC
  Filled 2017-12-17: qty 5

## 2017-12-17 MED ORDER — IOPAMIDOL (ISOVUE-300) INJECTION 61%
75.0000 mL | Freq: Once | INTRAVENOUS | Status: AC | PRN
  Administered 2017-12-17: 75 mL via INTRAVENOUS

## 2017-12-17 MED ORDER — SPOT INK MARKER SYRINGE KIT
PACK | SUBMUCOSAL | Status: DC | PRN
  Administered 2017-12-17: 2 mL via SUBMUCOSAL

## 2017-12-17 MED ORDER — MORPHINE SULFATE (PF) 2 MG/ML IV SOLN
2.0000 mg | INTRAVENOUS | Status: DC | PRN
  Administered 2017-12-17: 2 mg via INTRAVENOUS
  Filled 2017-12-17: qty 1

## 2017-12-17 MED ORDER — SENNA 8.6 MG PO TABS: 1.0000 | ORAL_TABLET | Freq: Every day | ORAL | Status: DC

## 2017-12-17 MED ORDER — FENTANYL CITRATE (PF) 100 MCG/2ML IJ SOLN
INTRAMUSCULAR | Status: AC
  Filled 2017-12-17: qty 2

## 2017-12-17 MED ORDER — SODIUM CHLORIDE 0.9 % IV SOLN
INTRAVENOUS | Status: DC
  Administered 2017-12-17 – 2017-12-18 (×2): via INTRAVENOUS

## 2017-12-17 MED ORDER — POTASSIUM CHLORIDE 10 MEQ/50ML IV SOLN: 10.0000 meq | INTRAVENOUS | Status: DC | PRN

## 2017-12-17 MED ORDER — MIDAZOLAM HCL 10 MG/2ML IJ SOLN
INTRAMUSCULAR | Status: DC | PRN
  Administered 2017-12-17 (×3): 1 mg via INTRAVENOUS

## 2017-12-17 MED ORDER — POTASSIUM CHLORIDE 10 MEQ/100ML IV SOLN
10.0000 meq | INTRAVENOUS | Status: DC | PRN
  Filled 2017-12-17: qty 100

## 2017-12-17 MED ORDER — POTASSIUM CHLORIDE 10 MEQ/100ML IV SOLN
10.0000 meq | INTRAVENOUS | Status: AC
  Administered 2017-12-17 (×2): 10 meq via INTRAVENOUS
  Filled 2017-12-17: qty 100

## 2017-12-17 MED ORDER — MIDAZOLAM HCL 5 MG/ML IJ SOLN
INTRAMUSCULAR | Status: AC
  Filled 2017-12-17: qty 2

## 2017-12-17 MED ORDER — IOPAMIDOL (ISOVUE-300) INJECTION 61%
INTRAVENOUS | Status: AC
  Filled 2017-12-17: qty 100

## 2017-12-17 MED ORDER — FERROUS SULFATE 325 (65 FE) MG PO TABS: 325.0000 mg | ORAL_TABLET | Freq: Every day | ORAL | Status: DC

## 2017-12-17 NOTE — Op Note (Signed)
[facility] Patient Name: Elizabeth Saunders Procedure Date : 12/17/2017 MRN: [id number] Attending MD: Carlota Raspberry. Alanda Colton MD, MD Date of Birth: 1956/01/28 CSN: [id number] Age: 62 Admit Type: Inpatient Procedure:                Flexible Sigmoidoscopy Indications:              Abnormal CT of the GI tract - rectosigmoid                            thickening, along with profound microcytic anemia Providers:                Carlota Raspberry. Lachelle Rissler MD, MD, Cleda Daub, RN,                            William Dalton, Technician Referring MD:              Medicines:                Midazolam 3 mg IV, Fentanyl 25 micrograms IV Complications:            No immediate complications. Estimated blood loss:                            Minimal. Estimated Blood Loss:     Estimated blood loss was minimal. Procedure:                Pre-Anesthesia Assessment:                           - Prior to the procedure, a History and Physical                            was performed, and patient medications and                            allergies were reviewed. The patient's tolerance of                            previous anesthesia was also reviewed. The risks                            and benefits of the procedure and the sedation                            options and risks were discussed with the patient.                            All questions were answered, and informed consent                            was obtained. Prior Anticoagulants: The patient has                            taken no previous anticoagulant or antiplatelet  agents. ASA Grade Assessment: III - A patient with                            severe systemic disease. After reviewing the risks                            and benefits, the patient was deemed in                            satisfactory condition to undergo the procedure.                           After obtaining informed consent, the scope was                      passed under direct vision. The EG-[id number] ([id number])                            scope was introduced through the anus and advanced                            to the the rectosigmoid junction. The flexible                            sigmoidoscopy was accomplished without difficulty.                            The patient tolerated the procedure well. The                            quality of the bowel preparation was adequate. Scope In: 1:53:44 PM Scope Out: 2:07:25 PM Total Procedure Duration: 0 hours 13 minutes 40 seconds  Findings:      The perianal and digital rectal examinations were normal.      A fungating nearly completely obstructing large mass was found in the       recto-sigmoid colon at an angulated turn, roughly 13-15cm from anal       verge. The mass was circumferential. Biopsies were taken with a cold       forceps for histology. Area distal to the mass was tattooed with an       injection of Spot (carbon black).      The rectum appeared normal. 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 Biopsied. Tattooed.                           - The rectum is normal. Moderate Sedation:      Moderate (conscious) sedation was administered by the endoscopy nurse       and supervised by the endoscopist. The following parameters were       monitored: oxygen saturation, heart rate, blood pressure, and response       to care. Total physician  intraservice time was 15 minutes. Recommendation:           - Return patient to hospital ward for ongoing care.                           - NPO.                           - Continue present medications.                           - Await pathology results                           - Surgical consult. I do not think this lesion is                            amenable to stenting                           - CT chest for staging                            - CEA level Procedure Code(s):        --- Professional ---                           [phone], 52, Sigmoidoscopy, flexible; with biopsy,                            single or multiple                           [id number], 52, Sigmoidoscopy, flexible; with directed                            submucosal injection(s), any substance                           99152, 59, Moderate sedation services provided by                            the same physician or other qualified health care                            professional performing the diagnostic or                            therapeutic service that the sedation supports,                            requiring the presence of an independent trained                            observer to assist in the monitoring of the  patient's level of consciousness and physiological                            status; initial 15 minutes of intraservice time,                            patient age 32 years or older Diagnosis Code(s):        --- Professional ---                           D49.0, Neoplasm of unspecified behavior of                            digestive system                           K56.691, Other complete intestinal obstruction                           R93.3, Abnormal findings on diagnostic imaging of                            other parts of digestive tract CPT copyright 2017 [employer]. All rights reserved. The codes documented in this report are preliminary and upon coder review may  be revised to meet current compliance requirements. Remo Lipps P. Roise Emert MD, MD 12/17/2017 2:18:41 PM This report has been signed electronically. Number of Addenda: 0

## 2017-12-17 NOTE — Progress Notes (Signed)
Patient arrived to the unit via bed from the emergency department.  Patient is alert and oriented.  Skin assessment complete. No skin issues.  Placed the patient on telemetry.  Educated the patient and family on how to reach the staff on the unit.  Lowered the bed and placed the call light within reach.  Will continue to monitor the patient

## 2017-12-17 NOTE — Progress Notes (Signed)
MD paged about patient's diet and MD stated she needs to remain NPO due to what they found during the flexible sigmoidoscopy. Family and patient will be notified.

## 2017-12-17 NOTE — Interval H&P Note (Signed)
History and Physical Interval Note:  12/17/2017 1:33 PM  Elizabeth Saunders  has presented today for surgery, with the diagnosis of abnormal CT scan  The various methods of treatment have been discussed with the patient and family. After consideration of risks, benefits and other options for treatment, the patient has consented to  Procedure(s): FLEXIBLE SIGMOIDOSCOPY (N/A) as a surgical intervention .  The patient's history has been reviewed, patient examined, no change in status, stable for surgery.  I have reviewed the patient's chart and labs.  Questions were answered to the patient's satisfaction.     [place]

## 2017-12-17 NOTE — Progress Notes (Signed)
  Date: 12/17/2017  Patient name: Elizabeth Saunders  Medical record number: [id number]  Date of birth: [birth year]   I have seen and evaluated this patient and I have discussed the plan of care with the house staff. Please see their note for complete details. I concur with their findings with the following additions/corrections:   62 yo woman presenting with LLQ pain found to have partial colonic obstruction due to likely mass in sigmoid on CT. Severe iron deficiency anemia, likely due to blood loss, with Hgb 5.4, appropriate response to 2 units pRBCs. Flex sigmoidoscopy today showing fungating mass with near complete obstruction, not amenable to stenting, will likely need surgical resection. Will convey this news to her with aid of interpreter.  Lenice Pressman, M.D., Ph.D. 12/17/2017, 5:54 PM
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
## 2017-12-17 NOTE — Progress Notes (Signed)
Tap water enema complete

## 2017-12-18 ENCOUNTER — Encounter (HOSPITAL_COMMUNITY): Payer: Self-pay | Admitting: Certified Registered Nurse Anesthetist

## 2017-12-18 ENCOUNTER — Inpatient Hospital Stay (HOSPITAL_COMMUNITY): Payer: Medicaid Other | Admitting: Certified Registered Nurse Anesthetist

## 2017-12-18 ENCOUNTER — Encounter (HOSPITAL_COMMUNITY)
Admission: EM | Disposition: A | Discharge status: Home Health | Payer: Self-pay | Source: Home / Self Care | Attending: Internal Medicine

## 2017-12-18 DIAGNOSIS — C20 Malignant neoplasm of rectum: Secondary | ICD-10-CM | POA: Diagnosis present

## 2017-12-18 HISTORY — PX: COLON RESECTION: SHX5231

## 2017-12-18 HISTORY — PX: COLOSTOMY: SHX63

## 2017-12-18 LAB — BASIC METABOLIC PANEL
Anion gap: 12 (ref 5–15)
BUN: 9 mg/dL (ref 6–20)
CO2: 13 mmol/L — ABNORMAL LOW (ref 22–32)
Calcium: 8.2 mg/dL — ABNORMAL LOW (ref 8.9–10.3)
Chloride: 113 mmol/L — ABNORMAL HIGH (ref 101–111)
Creatinine, Ser: 0.57 mg/dL (ref 0.44–1.00)
GFR calc Af Amer: >60 mL/min (ref >60)
GFR calc non Af Amer: >60 mL/min (ref >60)
Glucose, Bld: 69 mg/dL (ref 65–99)
Potassium: 3.6 mmol/L (ref 3.5–5.1)
Sodium: 138 mmol/L (ref 135–145)

## 2017-12-18 LAB — CBC
HCT: 28.6 % — ABNORMAL LOW (ref 36.0–46.0)
Hemoglobin: 8.1 g/dL — ABNORMAL LOW (ref 12.0–15.0)
MCH: 14.9 pg — ABNORMAL LOW (ref 26.0–34.0)
MCHC: 28.3 g/dL — ABNORMAL LOW (ref 30.0–36.0)
MCV: 52.7 fL — ABNORMAL LOW (ref 78.0–100.0)
Platelets: 308 10*3/uL (ref 150–400)
RBC: 5.43 MIL/uL — ABNORMAL HIGH (ref 3.87–5.11)
RDW: 33.2 % — ABNORMAL HIGH (ref 11.5–15.5)
WBC: 9.4 10*3/uL (ref 4.0–10.5)

## 2017-12-18 LAB — CEA: CEA1: 7.7 ng/mL — AB (ref 0.0–4.7)

## 2017-12-18 LAB — MRSA PCR SCREENING: MRSA by PCR: POSITIVE — AB

## 2017-12-18 SURGERY — COLON RESECTION
Anesthesia: General | Site: Abdomen

## 2017-12-18 MED ORDER — MORPHINE SULFATE 2 MG/ML IV SOLN
INTRAVENOUS | Status: DC
  Administered 2017-12-18: 6 mg via INTRAVENOUS
  Administered 2017-12-18: 16:00:00 via INTRAVENOUS
  Administered 2017-12-19: 1.5 mg via INTRAVENOUS
  Administered 2017-12-19: 4.5 mg via INTRAVENOUS
  Administered 2017-12-19: 1.5 mg via INTRAVENOUS
  Administered 2017-12-20: 3 mg via INTRAVENOUS
  Administered 2017-12-20 – 2017-12-21 (×5): 0 mg via INTRAVENOUS
  Filled 2017-12-18: qty 30

## 2017-12-18 MED ORDER — PROMETHAZINE HCL 25 MG/ML IJ SOLN: 6.2500 mg | INTRAMUSCULAR | Status: DC | PRN

## 2017-12-18 MED ORDER — NALOXONE HCL 0.4 MG/ML IJ SOLN: 0.4000 mg | INTRAMUSCULAR | Status: DC | PRN

## 2017-12-18 MED ORDER — ROCURONIUM BROMIDE 10 MG/ML (PF) SYRINGE
PREFILLED_SYRINGE | INTRAVENOUS | Status: AC
  Filled 2017-12-18: qty 5

## 2017-12-18 MED ORDER — 0.9 % SODIUM CHLORIDE (POUR BTL) OPTIME
TOPICAL | Status: DC | PRN
  Administered 2017-12-18 (×2): 1000 mL

## 2017-12-18 MED ORDER — NEOSTIGMINE METHYLSULFATE 5 MG/5ML IV SOSY
PREFILLED_SYRINGE | INTRAVENOUS | Status: DC | PRN
  Administered 2017-12-18: 2.5 mg via INTRAVENOUS

## 2017-12-18 MED ORDER — FENTANYL CITRATE (PF) 250 MCG/5ML IJ SOLN
INTRAMUSCULAR | Status: AC
  Filled 2017-12-18: qty 5

## 2017-12-18 MED ORDER — PROPOFOL 10 MG/ML IV BOLUS
INTRAVENOUS | Status: DC | PRN
  Administered 2017-12-18: 140 mg via INTRAVENOUS
  Administered 2017-12-18: 10 mg via INTRAVENOUS

## 2017-12-18 MED ORDER — LIDOCAINE 2% (20 MG/ML) 5 ML SYRINGE
INTRAMUSCULAR | Status: AC
  Filled 2017-12-18: qty 5

## 2017-12-18 MED ORDER — DEXAMETHASONE SODIUM PHOSPHATE 10 MG/ML IJ SOLN
INTRAMUSCULAR | Status: DC | PRN
  Administered 2017-12-18: 10 mg via INTRAVENOUS

## 2017-12-18 MED ORDER — ROCURONIUM BROMIDE 10 MG/ML (PF) SYRINGE
PREFILLED_SYRINGE | INTRAVENOUS | Status: DC | PRN
  Administered 2017-12-18: 50 mg via INTRAVENOUS

## 2017-12-18 MED ORDER — ONDANSETRON HCL 4 MG/2ML IJ SOLN
INTRAMUSCULAR | Status: DC | PRN
  Administered 2017-12-18: 4 mg via INTRAVENOUS

## 2017-12-18 MED ORDER — MUPIROCIN 2 % EX OINT
1.0000 "application " | TOPICAL_OINTMENT | Freq: Two times a day (BID) | CUTANEOUS | Status: AC
  Administered 2017-12-19 – 2017-12-20 (×5): 1 via NASAL
  Filled 2017-12-18 (×2): qty 22

## 2017-12-18 MED ORDER — SODIUM CHLORIDE 0.9 % IV SOLN
2.0000 g | Freq: Once | INTRAVENOUS | Status: AC
  Administered 2017-12-18: 2 g via INTRAVENOUS
  Filled 2017-12-18: qty 2

## 2017-12-18 MED ORDER — SODIUM CHLORIDE 0.9% FLUSH: 9.0000 mL | INTRAVENOUS | Status: DC | PRN

## 2017-12-18 MED ORDER — OXYCODONE HCL 5 MG/5ML PO SOLN: 5.0000 mg | Freq: Once | ORAL | Status: DC | PRN

## 2017-12-18 MED ORDER — ALBUMIN HUMAN 5 % IV SOLN
INTRAVENOUS | Status: DC | PRN
  Administered 2017-12-18: 13:00:00 via INTRAVENOUS

## 2017-12-18 MED ORDER — MIDAZOLAM HCL 5 MG/5ML IJ SOLN
INTRAMUSCULAR | Status: DC | PRN
  Administered 2017-12-18: 2 mg via INTRAVENOUS

## 2017-12-18 MED ORDER — HYDROMORPHONE HCL 1 MG/ML IJ SOLN
INTRAMUSCULAR | Status: AC
  Filled 2017-12-18: qty 0.5

## 2017-12-18 MED ORDER — ENOXAPARIN SODIUM 40 MG/0.4ML ~~LOC~~ SOLN
40.0000 mg | SUBCUTANEOUS | Status: DC
  Administered 2017-12-19 – 2017-12-21 (×2): 40 mg via SUBCUTANEOUS
  Filled 2017-12-18 (×3): qty 0.4

## 2017-12-18 MED ORDER — NEOSTIGMINE METHYLSULFATE 5 MG/5ML IV SOSY
PREFILLED_SYRINGE | INTRAVENOUS | Status: AC
  Filled 2017-12-18: qty 5

## 2017-12-18 MED ORDER — MEPERIDINE HCL 50 MG/ML IJ SOLN: 6.2500 mg | INTRAMUSCULAR | Status: DC | PRN

## 2017-12-18 MED ORDER — KCL IN DEXTROSE-NACL 20-5-0.45 MEQ/L-%-% IV SOLN
INTRAVENOUS | Status: DC
Start: 2017-12-18 — End: 2017-12-22
  Administered 2017-12-18 – 2017-12-22 (×9): via INTRAVENOUS
  Filled 2017-12-18 (×10): qty 1000

## 2017-12-18 MED ORDER — HYDROMORPHONE HCL 1 MG/ML IJ SOLN
INTRAMUSCULAR | Status: DC | PRN
  Administered 2017-12-18: 0.5 mg via INTRAVENOUS

## 2017-12-18 MED ORDER — PHENYLEPHRINE 40 MCG/ML (10ML) SYRINGE FOR IV PUSH (FOR BLOOD PRESSURE SUPPORT)
PREFILLED_SYRINGE | INTRAVENOUS | Status: AC
  Filled 2017-12-18: qty 10

## 2017-12-18 MED ORDER — HYDROMORPHONE HCL 2 MG/ML IJ SOLN: 0.2500 mg | INTRAMUSCULAR | Status: DC | PRN

## 2017-12-18 MED ORDER — LIDOCAINE 2% (20 MG/ML) 5 ML SYRINGE
INTRAMUSCULAR | Status: DC | PRN
  Administered 2017-12-18: 60 mg via INTRAVENOUS

## 2017-12-18 MED ORDER — GLYCOPYRROLATE 0.2 MG/ML IV SOSY
PREFILLED_SYRINGE | INTRAVENOUS | Status: DC | PRN
  Administered 2017-12-18: .3 mg via INTRAVENOUS

## 2017-12-18 MED ORDER — OXYCODONE HCL 5 MG PO TABS: 5.0000 mg | ORAL_TABLET | Freq: Once | ORAL | Status: DC | PRN

## 2017-12-18 MED ORDER — DEXAMETHASONE SODIUM PHOSPHATE 10 MG/ML IJ SOLN
INTRAMUSCULAR | Status: AC
  Filled 2017-12-18: qty 1

## 2017-12-18 MED ORDER — ONDANSETRON HCL 4 MG/2ML IJ SOLN: 4.0000 mg | Freq: Four times a day (QID) | INTRAMUSCULAR | Status: DC | PRN

## 2017-12-18 MED ORDER — FENTANYL CITRATE (PF) 100 MCG/2ML IJ SOLN
INTRAMUSCULAR | Status: DC | PRN
  Administered 2017-12-18 (×3): 50 ug via INTRAVENOUS
  Administered 2017-12-18: 100 ug via INTRAVENOUS
  Administered 2017-12-18 (×2): 50 ug via INTRAVENOUS

## 2017-12-18 MED ORDER — PROPOFOL 10 MG/ML IV BOLUS
INTRAVENOUS | Status: AC
  Filled 2017-12-18: qty 20

## 2017-12-18 MED ORDER — PHENYLEPHRINE 40 MCG/ML (10ML) SYRINGE FOR IV PUSH (FOR BLOOD PRESSURE SUPPORT)
PREFILLED_SYRINGE | INTRAVENOUS | Status: DC | PRN
  Administered 2017-12-18 (×2): 80 ug via INTRAVENOUS

## 2017-12-18 MED ORDER — MIDAZOLAM HCL 2 MG/2ML IJ SOLN
INTRAMUSCULAR | Status: AC
  Filled 2017-12-18: qty 2

## 2017-12-18 MED ORDER — LACTATED RINGERS IV SOLN
INTRAVENOUS | Status: DC
  Administered 2017-12-18 (×2): via INTRAVENOUS

## 2017-12-18 MED ORDER — ONDANSETRON HCL 4 MG/2ML IJ SOLN
INTRAMUSCULAR | Status: AC
  Filled 2017-12-18: qty 2

## 2017-12-18 MED ORDER — DIPHENHYDRAMINE HCL 50 MG/ML IJ SOLN: 12.5000 mg | Freq: Four times a day (QID) | INTRAMUSCULAR | Status: DC | PRN

## 2017-12-18 MED ORDER — DIPHENHYDRAMINE HCL 12.5 MG/5ML PO ELIX: 12.5000 mg | ORAL_SOLUTION | Freq: Four times a day (QID) | ORAL | Status: DC | PRN

## 2017-12-18 SURGICAL SUPPLY — 57 items
BIOPATCH BLUE 3/4IN DISK W/1.5 (GAUZE/BANDAGES/DRESSINGS) ×2 IMPLANT
CANISTER SUCT 3000ML PPV (MISCELLANEOUS) ×3 IMPLANT
CHLORAPREP W/TINT 26ML (MISCELLANEOUS) ×3 IMPLANT
COVER MAYO STAND STRL (DRAPES) ×3 IMPLANT
COVER SURGICAL LIGHT HANDLE (MISCELLANEOUS) ×4 IMPLANT
DRAIN CHANNEL 19F RND (DRAIN) ×2 IMPLANT
DRAPE HALF SHEET 40X57 (DRAPES) ×3 IMPLANT
DRAPE LAPAROSCOPIC ABDOMINAL (DRAPES) ×3 IMPLANT
DRAPE UTILITY XL STRL (DRAPES) ×3 IMPLANT
DRAPE WARM FLUID 44X44 (DRAPE) ×3 IMPLANT
DRSG OPSITE POSTOP 4X10 (GAUZE/BANDAGES/DRESSINGS) ×2 IMPLANT
DRSG OPSITE POSTOP 4X8 (GAUZE/BANDAGES/DRESSINGS) ×2 IMPLANT
DRSG TEGADERM 2-3/8X2-3/4 SM (GAUZE/BANDAGES/DRESSINGS) ×4 IMPLANT
DRSG TEGADERM 4X4.75 (GAUZE/BANDAGES/DRESSINGS) ×4 IMPLANT
ELECT BLADE 6.5 EXT (BLADE) ×2 IMPLANT
ELECT CAUTERY BLADE 6.4 (BLADE) ×4 IMPLANT
ELECT REM PT RETURN 9FT ADLT (ELECTROSURGICAL) ×3
ELECTRODE REM PT RTRN 9FT ADLT (ELECTROSURGICAL) ×1 IMPLANT
EVACUATOR SILICONE 100CC (DRAIN) ×2 IMPLANT
GLOVE BIO SURGEON STRL SZ7 (GLOVE) ×6 IMPLANT
GLOVE BIOGEL PI IND STRL 7.5 (GLOVE) ×2 IMPLANT
GLOVE BIOGEL PI INDICATOR 7.5 (GLOVE) ×4
GOWN STRL REUS W/ TWL LRG LVL3 (GOWN DISPOSABLE) ×6 IMPLANT
GOWN STRL REUS W/TWL LRG LVL3 (GOWN DISPOSABLE) ×18
HANDLE SUCTION POOLE (INSTRUMENTS) IMPLANT
KIT BASIN OR (CUSTOM PROCEDURE TRAY) ×3 IMPLANT
KIT OSTOMY DRAINABLE 2.75 STR (WOUND CARE) ×2 IMPLANT
KIT TURNOVER KIT B (KITS) ×3 IMPLANT
LIGASURE IMPACT 36 18CM CVD LR (INSTRUMENTS) ×2 IMPLANT
NS IRRIG 1000ML POUR BTL (IV SOLUTION) ×6 IMPLANT
PACK GENERAL/GYN (CUSTOM PROCEDURE TRAY) ×3 IMPLANT
PAD ARMBOARD 7.5X6 YLW CONV (MISCELLANEOUS) ×3 IMPLANT
PENCIL BUTTON HOLSTER BLD 10FT (ELECTRODE) ×3 IMPLANT
SPONGE LAP 18X18 X RAY DECT (DISPOSABLE) IMPLANT
STAPLER CUT CVD 40MM GREEN (STAPLE) ×2 IMPLANT
STAPLER GUN LINEAR PROX 60 (STAPLE) ×2 IMPLANT
STAPLER VISISTAT 35W (STAPLE) ×3 IMPLANT
SUCTION POOLE HANDLE (INSTRUMENTS) ×3
SUCTION POOLE TIP (SUCTIONS) ×3 IMPLANT
SURGILUBE 2OZ TUBE FLIPTOP (MISCELLANEOUS) IMPLANT
SUT ETHILON 3 0 FSL (SUTURE) ×2 IMPLANT
SUT PDS AB 1 TP1 54 (SUTURE) ×4 IMPLANT
SUT PDS AB 1 TP1 96 (SUTURE) ×2 IMPLANT
SUT PROLENE 2 0 CT2 30 (SUTURE) ×2 IMPLANT
SUT PROLENE 2 0 KS (SUTURE) IMPLANT
SUT SILK 2 0 SH CR/8 (SUTURE) ×3 IMPLANT
SUT SILK 2 0 TIES 10X30 (SUTURE) ×3 IMPLANT
SUT SILK 3 0 SH CR/8 (SUTURE) ×3 IMPLANT
SUT SILK 3 0 TIES 10X30 (SUTURE) ×3 IMPLANT
SUT VIC AB 3-0 SH 18 (SUTURE) ×4 IMPLANT
SYR BULB IRRIGATION 50ML (SYRINGE) ×3 IMPLANT
TOWEL OR 17X26 10 PK STRL BLUE (TOWEL DISPOSABLE) ×6 IMPLANT
TRAY FOLEY W/METER SILVER 16FR (SET/KITS/TRAYS/PACK) ×2 IMPLANT
TRAY PROCTOSCOPIC FIBER OPTIC (SET/KITS/TRAYS/PACK) IMPLANT
TUBE CONNECTING 12'X1/4 (SUCTIONS) ×1
TUBE CONNECTING 12X1/4 (SUCTIONS) ×2 IMPLANT
YANKAUER SUCT BULB TIP NO VENT (SUCTIONS) ×3 IMPLANT

## 2017-12-18 NOTE — Anesthesia Preprocedure Evaluation (Signed)
Anesthesia Evaluation  Patient identified by MRN, date of birth, ID band Patient awake    Reviewed: Allergy & Precautions, NPO status , Patient's Chart, lab work & pertinent test results  Airway Mallampati: II  TM Distance: >3 FB Neck ROM: Full    Dental no notable dental hx.    Pulmonary neg pulmonary ROS,    Pulmonary exam normal breath sounds clear to auscultation       Cardiovascular negative cardio ROS Normal cardiovascular exam Rhythm:Regular Rate:Normal     Neuro/Psych negative neurological ROS  negative psych ROS   GI/Hepatic negative GI ROS, Neg liver ROS,   Endo/Other  negative endocrine ROS  Renal/GU negative Renal ROS  negative genitourinary   Musculoskeletal negative musculoskeletal ROS (+)   Abdominal   Peds negative pediatric ROS (+)  Hematology negative hematology ROS (+) anemia ,   Anesthesia Other Findings Colon mass  Reproductive/Obstetrics negative OB ROS                             Anesthesia Physical Anesthesia Plan  ASA: III  Anesthesia Plan: General   Post-op Pain Management:    Induction: Intravenous  PONV Risk Score and Plan: 3 and Ondansetron, Midazolam and Dexamethasone  Airway Management Planned: Oral ETT  Additional Equipment:   Intra-op Plan:   Post-operative Plan: Extubation in OR  Informed Consent: I have reviewed the patients History and Physical, chart, labs and discussed the procedure including the risks, benefits and alternatives for the proposed anesthesia with the patient or authorized representative who has indicated his/her understanding and acceptance.   Dental advisory given  Plan Discussed with: CRNA  Anesthesia Plan Comments:         Anesthesia Quick Evaluation

## 2017-12-18 NOTE — Progress Notes (Signed)
Placed on tele to monitor  

## 2017-12-18 NOTE — Op Note (Addendum)
Operative Note  Elizabeth Saunders  [id number]  [id number]  12/18/2017   Surgeon: Vikki Ports A ConnorMD  Procedure: exploratory laparotomy, partial colectomy (recto-sigmoid) with end colostomy   Assistant Claiborne Billings Rayburn PA-C   Preop diagnosis: obstructing rectosigmoid mass Post-op diagnosis/intraop findings: same, no evidence of metastatic disease  Specimens: partial colectomy Retained items: 75fr round blake drain EBL: minimal cc Complications: none  Description of procedure: After obtaining informed consent the patient was taken to the operating room and placed supine on operating room table wheregeneral endotracheal anesthesia was initiated, preoperative antibiotics were administered, SCDs applied, and a formal timeout was performed. The abdomen was prepped and draped in usual sterile fashion. A midline vertical laparotomy was created and the soft tissues dissected with cautery until linea alba could be identified and divided. The peritoneal cavity was entered bluntly. The small bowel and colon were all very dilated. There is no free fluid or evidence of perforation. The liver was inspected by palpation and there was no evidence of metastatic disease. The mass was palpable in the proximal rectum. The lateral attachments of the sigmoid colon and descending colon were taken down with cautery. The left ureter was identified confirmed to be away from our dissection. The mesorectum was scored extending down distally beyond the level of the mass and a window was created bluntly posterior to the rectum. A green load contour stapler was introduced and the rectum was transected distal to the mass. A TX 60 blue load was used to transect the sigmoid colon proximally after placing a bowel clamp distally. The intervening mesentery was divided with LigaSure. Bleeding points on the mesentery were oversewn with figure-of-eight sutures of 3-0 silk.The mass was sent for frozen section and confirmed to have negative  margins with the closest being 1.7 cm distally. The rectal stump was marked with 2 sutures of 2-0 Prolene. The abdomen was irrigated and hemostasis confirmed. The small bowel was run from ligament of Treitz to the ileocecal valve confirming be free of any injury or any other abnormality. The remainder of the colon appeared although dilated and filled with stool, free of injury or abnormality. And 31 French round Blake drain was left in the pelvis this exits in the right lower abdomen was secured the skin with a 3-0 nylon.  The colostomy site was created in the left lower quadrant at the region previously marked by the stoma nurse by excising a wedge of skin and subcutaneous tissue. The fascia was divided in a cruciate fashion, the rectus muscle split and the posterior fascia divided with cautery. This was dilated to accommodate 2-3 fingers and then the end of the sigmoid colon was brought out through this and held with a Babcock. The midline incision was closed with running #1 single strand PDS start at either end and tying centrally. The skin was closed with staples and the honeycomb dressing applied. The colostomy was then matured using interrupted 3-0 Vicryl sutures in the standard fashion. The ostomy was digitized and confirmed to be patent. A colostomy appliance was placed and the drain was placed to bulb suction. The patient was then awakened, extubated and taken to PACU in stable condition.   All counts were correct at the completion of the case.

## 2017-12-18 NOTE — Anesthesia Postprocedure Evaluation (Signed)
Anesthesia Post Note  Patient: Elizabeth Saunders  Procedure(s) Performed: COLON RESECTION (N/A Abdomen) COLOSTOMY (N/A Abdomen)     Patient location during evaluation: PACU Anesthesia Type: General Level of consciousness: awake and alert Pain management: pain level controlled Vital Signs Assessment: post-procedure vital signs reviewed and stable Respiratory status: spontaneous breathing, nonlabored ventilation and respiratory function stable Cardiovascular status: blood pressure returned to baseline and stable Postop Assessment: no apparent nausea or vomiting Anesthetic complications: no    Last Vitals:  Vitals:   12/18/17 1516 12/18/17 1528  BP:  136/81  Pulse: 85 93  Resp: (!) 23 20  Temp: 36.8 C   SpO2: 97% 97%    Last Pain:  Vitals:   12/18/17 1516  TempSrc:   PainSc: 0-No pain                 Lynda Rainwater

## 2017-12-18 NOTE — Consult Note (Signed)
Reason for Consult: obstructing sigmoid mass Referring Physician: Rinaldo Cloud Huckaba is an 62 y.o. female.  HPI: 62 yo female presented with weakness and left sided abdominal pain. The pain would wax and wane. She presented to the ER and was found to be anemic and received 3 units of blood. She underwent sigmoidoscopy and was found to have a near obstructing mass at 15cm.    Past Medical History:  Diagnosis Date  . Anemia 12/16/2017    Past Surgical History:  Procedure Laterality Date  . FLEXIBLE SIGMOIDOSCOPY N/A 12/17/2017   Procedure: FLEXIBLE SIGMOIDOSCOPY;  Surgeon: Yetta Flock, MD;  Location: [facility];  Service: Gastroenterology;  Laterality: N/A;    History reviewed. No pertinent family history.  Social History:  reports that she has never smoked. She has never used smokeless tobacco. She reports that she does not drink alcohol or use drugs.  Allergies: No Known Allergies  Medications: I have reviewed the patient's current medications.  Results for orders placed or performed during the hospital encounter of 12/16/17 (from the past 48 hour(s))  Lipase, blood     Status: None   Collection Time: 12/16/17  7:55 AM  Result Value Ref Range   Lipase 36 11 - 51 U/L    Comment: Performed at [facility], [address], [place], [place] [address]  Comprehensive metabolic panel     Status: Abnormal   Collection Time: 12/16/17  7:55 AM  Result Value Ref Range   Sodium 137 135 - 145 mmol/L   Potassium 3.8 3.5 - 5.1 mmol/L   Chloride 108 101 - 111 mmol/L   CO2 21 (L) 22 - 32 mmol/L   Glucose, Bld 117 (H) 65 - 99 mg/dL   BUN 10 6 - 20 mg/dL   Creatinine, Ser 0.51 0.44 - 1.00 mg/dL   Calcium 8.3 (L) 8.9 - 10.3 mg/dL   Total Protein 7.9 6.5 - 8.1 g/dL   Albumin 3.5 3.5 - 5.0 g/dL   AST 17 15 - 41 U/L   ALT 9 (L) 14 - 54 U/L   Alkaline Phosphatase 94 38 - 126 U/L   Total Bilirubin 0.5 0.3 - 1.2 mg/dL   GFR calc non Af Amer >60 >60 mL/min   GFR calc Af  Amer >60 >60 mL/min    Comment: (NOTE) The eGFR has been calculated using the CKD EPI equation. This calculation has not been validated in all clinical situations. eGFR's persistently <60 mL/min signify possible Chronic Kidney Disease.    Anion gap 8 5 - 15    Comment: Performed at [facility] [address], [place], [place] [address]  CBC     Status: Abnormal   Collection Time: 12/16/17  7:55 AM  Result Value Ref Range   WBC 6.0 4.0 - 10.5 K/uL   RBC 4.60 3.87 - 5.11 MIL/uL   Hemoglobin 5.4 (LL) 12.0 - 15.0 g/dL    Comment: REPEATED TO VERIFY CRITICAL RESULT CALLED TO, READ BACK BY AND VERIFIED WITH: C SLADE,RN 0857 12/16/17 D BRADLEY    HCT 20.6 (L) 36.0 - 46.0 %   MCV 44.8 (L) 78.0 - 100.0 fL   MCH 11.7 (L) 26.0 - 34.0 pg   MCHC 26.2 (L) 30.0 - 36.0 g/dL   RDW 22.0 (H) 11.5 - 15.5 %   Platelets 371 150 - 400 K/uL    Comment: Performed at [facility], [place] [address] [place], [place] [address]  Urinalysis, Routine  w reflex microscopic     Status: None   Collection Time: 12/16/17  8:05 AM  Result Value Ref Range   Color, Urine YELLOW YELLOW   APPearance CLEAR CLEAR   Specific Gravity, Urine 1.018 1.005 - 1.030   pH 5.0 5.0 - 8.0   Glucose, UA NEGATIVE NEGATIVE mg/dL   Hgb urine dipstick NEGATIVE NEGATIVE   Bilirubin Urine NEGATIVE NEGATIVE   Ketones, ur NEGATIVE NEGATIVE mg/dL   Protein, ur NEGATIVE NEGATIVE mg/dL   Nitrite NEGATIVE NEGATIVE   Leukocytes, UA NEGATIVE NEGATIVE    Comment: Performed at [facility] [address] [place], [place] [address]  Type and screen [place]     Status: None   Collection Time: 12/16/17  9:00 AM  Result Value Ref Range   ABO/RH(D) AB POS    Antibody Screen NEG    Sample Expiration 12/19/2017    Unit Number [id number]    Blood Component Type RED CELLS,LR    Unit division 00    Status of Unit ISSUED,FINAL    Transfusion Status OK TO TRANSFUSE    Crossmatch Result       Compatible Performed at [facility], [place] [address], [place], [place] [address]    Unit Number [id number]    Blood Component Type RED CELLS,LR    Unit division 00    Status of Unit ISSUED,FINAL    Transfusion Status OK TO TRANSFUSE    Crossmatch Result Compatible   ABO/Rh     Status: None   Collection Time: 12/16/17  9:00 AM  Result Value Ref Range   ABO/RH(D)      AB POS Performed at [facility], [place] [address], [place], [place] [address]   POC occult blood, ED Provider will collect     Status: None   Collection Time: 12/16/17 11:54 AM  Result Value Ref Range   Fecal Occult Bld NEGATIVE NEGATIVE  Iron and TIBC     Status: Abnormal   Collection Time: 12/16/17  1:50 PM  Result Value Ref Range   Iron 11 (L) 28 - 170 ug/dL   TIBC 517 (H) 250 - 450 ug/dL   Saturation Ratios 2 (L) 10.4 - 31.8 %   UIBC 506 ug/dL    Comment: Performed at [facility], [place] [address], [place], [place] [address]  Ferritin     Status: Abnormal   Collection Time: 12/16/17  1:50 PM  Result Value Ref Range   Ferritin 2 (L) 11 - 307 ng/mL    Comment: Performed at [facility], [place] [address] [place], [place] [address]  Reticulocytes     Status: None   Collection Time: 12/16/17  1:50 PM  Result Value Ref Range   Retic Ct Pct 0.8 0.4 - 3.1 %   RBC. 4.32 3.87 - 5.11 MIL/uL   Retic Count, Absolute 34.6 19.0 - 186.0 K/uL    Comment: Performed at [facility] [address] [place], [place] [address]  Prepare RBC     Status: None   Collection Time: 12/16/17  3:29 PM  Result Value Ref Range   Order Confirmation      ORDER PROCESSED BY BLOOD BANK Performed at [facility], [place] [address], [place],  [address]   HIV antibody (Routine Testing)     Status: None   Collection Time: 12/16/17  8:12 PM  Result Value Ref Range   HIV Screen 4th  Generation wRfx Non Reactive Non Reactive    Comment: (NOTE) Performed At: [facility] [place], [place] [id number] Rush Farmer MD [id number] Performed at [facility], [place] [address] [place], [place] [address]   Hemoglobin and hematocrit, blood     Status: Abnormal   Collection Time: 12/16/17  8:12 PM  Result Value Ref Range   Hemoglobin 7.9 (L) 12.0 - 15.0 g/dL    Comment: REPEATED TO VERIFY POST TRANSFUSION SPECIMEN    HCT 27.7 (L) 36.0 - 46.0 %    Comment: Performed at [facility] [address] [place], [place] [address]  CBC     Status: Abnormal   Collection Time: 12/17/17  6:49 AM  Result Value Ref Range   WBC 6.0 4.0 - 10.5 K/uL   RBC 5.34 (H) 3.87 - 5.11 MIL/uL   Hemoglobin 8.0 (L) 12.0 - 15.0 g/dL   HCT 28.0 (L) 36.0 - 46.0 %   MCV 52.4 (L) 78.0 - 100.0 fL    Comment: REPEATED TO VERIFY POST TRANSFUSION SPECIMEN    MCH 15.0 (L) 26.0 - 34.0 pg   MCHC 28.6 (L) 30.0 - 36.0 g/dL   RDW 33.0 (H) 11.5 - 15.5 %   Platelets 313 150 - 400 K/uL    Comment: REPEATED TO VERIFY PLATELET COUNT CONFIRMED BY SMEAR Performed at [facility], [place] [address] [place], [place] [address]   Hemoglobin A1c     Status: None   Collection Time: 12/17/17  6:49 AM  Result Value Ref Range   Hgb A1c MFr Bld 5.3 4.8 - 5.6 %    Comment: (NOTE) Pre diabetes:          5.7%-6.4% Diabetes:              >6.4% Glycemic control for   <7.0% adults with diabetes    Mean Plasma Glucose 105.41 mg/dL    Comment: Performed at [facility] [address] [place], [place] [address]  Basic metabolic panel     Status: Abnormal   Collection Time: 12/17/17  6:49 AM  Result Value Ref Range   Sodium 140 135 - 145 mmol/L   Potassium 3.4 (L) 3.5 - 5.1 mmol/L   Chloride 111 101 - 111 mmol/L   CO2 19 (L) 22 - 32 mmol/L   Glucose, Bld 94 65 - 99 mg/dL   BUN 10 6 - 20 mg/dL   Creatinine, Ser 0.47 0.44 - 1.00 mg/dL   Calcium 8.3 (L) 8.9 - 10.3 mg/dL   GFR calc non Af Amer >60 >60 mL/min   GFR calc Af Amer >60 >60 mL/min    Comment: (NOTE) The eGFR has been  calculated using the CKD EPI equation. This calculation has not been validated in all clinical situations. eGFR's persistently <60 mL/min signify possible Chronic Kidney Disease.    Anion gap 10 5 - 15    Comment: Performed at [facility] [address] [place], [place] [address]    Ct Abdomen Pelvis W Contrast  Result Date: 12/16/2017 CLINICAL DATA:  Pt c/o LLQ pain feels like a large knot that is cramping and contracting pt says her tongue is also numb and rt foot EXAM: CT ABDOMEN AND PELVIS WITH CONTRAST TECHNIQUE: Multidetector CT imaging of the abdomen and pelvis was performed using the standard protocol following bolus administration of intravenous contrast. CONTRAST:  142m ISOVUE-300 IOPAMIDOL (ISOVUE-300) INJECTION 61% COMPARISON:  02/05/2016 and previous FINDINGS:  Lower chest: Partially calcified right pleural plaque. Hepatobiliary: No focal liver abnormality is seen. No gallstones, gallbladder wall thickening, or biliary dilatation. Pancreas: Unremarkable. No pancreatic ductal dilatation or surrounding inflammatory changes. Spleen: Normal in size without focal abnormality. Adrenals/Urinary Tract: Normal adrenals. Stable subcentimeter probable cyst in the mid right kidney. No hydronephrosis or nephrolithiasis. Urinary bladder nondistended. Stomach/Bowel: Stomach is nondistended. Small bowel decompressed. Appendix not discretely identified. Large amount of fecal material and gas distending the colon. There is mild circumferential wall thickening in the splenic flexure, descending and proximal sigmoid segments without significant adjacent inflammatory/edematous change or abscess. Metallic intraluminal linear densities near the rectosigmoid junction, a site of relative narrowing. The rectum is decompressed. Vascular/Lymphatic: Mild Aortic Atherosclerosis (ICD10-170.0) without aneurysm. No abdominal or pelvic adenopathy. Portal vein patent. Reproductive: Uterus and bilateral adnexa are  unremarkable. Other: No ascites.  No free air. Musculoskeletal: No acute or significant osseous findings. IMPRESSION: 1. 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 There are associated metallic linear density intraluminally at this level, which were not present on prior study of 02/05/2016, suggesting interval surgical or colonoscopic intervention versus foreign body. 2. Wall thickening in the descending and sigmoid colon suggesting colitis, possible stercoral given the apparent rectosigmoid stricture. Electronically Signed   By: Lucrezia Europe M.D.   On: 12/16/2017 11:27    Review of Systems  Constitutional: Positive for malaise/fatigue. Negative for chills and fever.  HENT: Negative for hearing loss.   Eyes: Negative for blurred vision and double vision.  Respiratory: Negative for cough and hemoptysis.   Cardiovascular: Negative for chest pain and palpitations.  Gastrointestinal: Positive for abdominal pain. Negative for nausea and vomiting.  Genitourinary: Negative for dysuria and urgency.  Musculoskeletal: Negative for myalgias and neck pain.  Skin: Negative for itching and rash.  Neurological: Negative for dizziness, tingling and headaches.  Endo/Heme/Allergies: Does not bruise/bleed easily.  Psychiatric/Behavioral: Negative for depression and suicidal ideas.   Blood pressure 111/64, pulse 86, temperature 98.2 F (36.8 C), temperature source Oral, resp. rate 18, height '5\' 3"'  (1.6 m), weight 46.7 kg (102 lb 15.3 oz), SpO2 98 %. Physical Exam  Vitals reviewed. Constitutional: She is oriented to person, place, and time. She appears well-developed and well-nourished.  HENT:  Head: Normocephalic and atraumatic.  Eyes: Pupils are equal, round, and reactive to light. Conjunctivae and EOM are normal.  Neck: Normal range of motion. Neck supple.  Cardiovascular: Normal rate and regular rhythm.  Respiratory: Effort normal and breath sounds normal.  GI: Soft. Bowel sounds are  normal. She exhibits no distension. There is tenderness in the left upper quadrant and left lower quadrant. There is no guarding.  Musculoskeletal: Normal range of motion.  Neurological: She is alert and oriented to person, place, and time.  Skin: Skin is warm and dry.  Psychiatric: She has a normal mood and affect. Her behavior is normal.    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 Her CT scan shows no liver masses or lung masses. Her colon appears dilated with stool. The mass was not amenable to stent placement. Given these findings I think proceeding with a partial colectomy with end colostomy is the best option for treatment. We discussed risks of abscess, infection, bleeding, bowel injury, ureter injury, pneumonia, and cardiac disease. We discussed that she would collect her stool in a bag for at least three months and then could have a reversal of her colostomy.  Arta Bruce Kinsinger 12/18/2017, 6:39 AM

## 2017-12-18 NOTE — Transfer of Care (Signed)
Immediate Anesthesia Transfer of Care Note  Patient: Elizabeth Saunders  Procedure(s) Performed: COLON RESECTION (N/A Abdomen) COLOSTOMY (N/A Abdomen)  Patient Location: PACU  Anesthesia Type:General  Level of Consciousness: patient cooperative and responds to stimulation  Airway & Oxygen Therapy: Patient Spontanous Breathing and Patient connected to nasal cannula oxygen  Post-op Assessment: Report given to RN and Post -op Vital signs reviewed and stable  Post vital signs: Reviewed and stable  Last Vitals:  Vitals Value Taken Time  BP 162/78 12/18/2017  2:11 PM  Temp    Pulse 86 12/18/2017  2:24 PM  Resp 17 12/18/2017  2:24 PM  SpO2 96 % 12/18/2017  2:24 PM  Vitals shown include unvalidated device data.  Last Pain:  Vitals:   12/18/17 0810  TempSrc:   PainSc: 0-No pain      Patients Stated Pain Goal: 0 (85/27/78 2423)  Complications: No apparent anesthesia complications
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
## 2017-12-18 NOTE — Progress Notes (Signed)
  Date: 12/18/2017  Patient name: Elizabeth Saunders  Medical record number: [id number]  Date of birth: 29-Jul-1956   I have seen and evaluated this patient and I have discussed the plan of care with the house staff. Please see their note for complete details. I concur with their findings with the following additions/corrections:   Seen in the PACU post-op after partial colectomy and colostomy creation, appeared to be doing well. Surgical site and ostomy looked excellent, she appeared comfortable, although evaluation limited by language barrier and son not at bedside. Will review pathology when available and plan referral to oncology for presumed colon cancer. Hopefully resolution of her colonic obstruction will improve her abdominal pain.   Lenice Pressman, M.D., Ph.D. 12/18/2017, 3:56 PM

## 2017-12-18 NOTE — Interval H&P Note (Signed)
History and Physical Interval Note:  12/18/2017 10:49 AM  Elizabeth Saunders  has presented today for surgery, with the diagnosis of Rectosigmoid colon mass  The various methods of treatment have been discussed with the patient and family. After consideration of risks, benefits and other options for treatment, the patient has consented to  Procedure(s): COLON RESECTION (N/A) COLOSTOMY (N/A) as a surgical intervention .  The patient's history has been reviewed, patient examined, no change in status, stable for surgery.  I have reviewed the patient's chart and labs.  Questions were answered to the patient's satisfaction.     [name]
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
## 2017-12-18 NOTE — H&P (View-Only) (Signed)
Reason for Consult: obstructing sigmoid mass Referring Physician: [name] is an 62 y.o. female.  HPI: 62 yo female presented with weakness and left sided abdominal pain. The pain would wax and wane. She presented to the ER and was found to be anemic and received 3 units of blood. She underwent sigmoidoscopy and was found to have a near obstructing mass at 15cm.    Past Medical History:  Diagnosis Date  . Anemia 12/16/2017    Past Surgical History:  Procedure Laterality Date  . FLEXIBLE SIGMOIDOSCOPY N/A 12/17/2017   Procedure: FLEXIBLE SIGMOIDOSCOPY;  Surgeon: Yetta Flock, MD;  Location: [facility];  Service: Gastroenterology;  Laterality: N/A;    History reviewed. No pertinent family history.  Social History:  reports that she has never smoked. She has never used smokeless tobacco. She reports that she does not drink alcohol or use drugs.  Allergies: No Known Allergies  Medications: I have reviewed the patient's current medications.  Results for orders placed or performed during the hospital encounter of 12/16/17 (from the past 48 hour(s))  Lipase, blood     Status: None   Collection Time: 12/16/17  7:55 AM  Result Value Ref Range   Lipase 36 11 - 51 U/L    Comment: Performed at [facility], [address], [place], [place] [address]  Comprehensive metabolic panel     Status: Abnormal   Collection Time: 12/16/17  7:55 AM  Result Value Ref Range   Sodium 137 135 - 145 mmol/L   Potassium 3.8 3.5 - 5.1 mmol/L   Chloride 108 101 - 111 mmol/L   CO2 21 (L) 22 - 32 mmol/L   Glucose, Bld 117 (H) 65 - 99 mg/dL   BUN 10 6 - 20 mg/dL   Creatinine, Ser 0.51 0.44 - 1.00 mg/dL   Calcium 8.3 (L) 8.9 - 10.3 mg/dL   Total Protein 7.9 6.5 - 8.1 g/dL   Albumin 3.5 3.5 - 5.0 g/dL   AST 17 15 - 41 U/L   ALT 9 (L) 14 - 54 U/L   Alkaline Phosphatase 94 38 - 126 U/L   Total Bilirubin 0.5 0.3 - 1.2 mg/dL   GFR calc non Af Amer >60 >60 mL/min   GFR calc Af  Amer >60 >60 mL/min    Comment: (NOTE) The eGFR has been calculated using the CKD EPI equation. This calculation has not been validated in all clinical situations. eGFR's persistently <60 mL/min signify possible Chronic Kidney Disease.    Anion gap 8 5 - 15    Comment: Performed at [facility] [address], [place], [place] [address]  CBC     Status: Abnormal   Collection Time: 12/16/17  7:55 AM  Result Value Ref Range   WBC 6.0 4.0 - 10.5 K/uL   RBC 4.60 3.87 - 5.11 MIL/uL   Hemoglobin 5.4 (LL) 12.0 - 15.0 g/dL    Comment: REPEATED TO VERIFY CRITICAL RESULT CALLED TO, READ BACK BY AND VERIFIED WITH: C SLADE,RN 0857 12/16/17 D BRADLEY    HCT 20.6 (L) 36.0 - 46.0 %   MCV 44.8 (L) 78.0 - 100.0 fL   MCH 11.7 (L) 26.0 - 34.0 pg   MCHC 26.2 (L) 30.0 - 36.0 g/dL   RDW 22.0 (H) 11.5 - 15.5 %   Platelets 371 150 - 400 K/uL    Comment: Performed at [facility], [place] [address] [place], [place] [address]  Urinalysis, Routine  w reflex microscopic     Status: None   Collection Time: 12/16/17  8:05 AM  Result Value Ref Range   Color, Urine YELLOW YELLOW   APPearance CLEAR CLEAR   Specific Gravity, Urine 1.018 1.005 - 1.030   pH 5.0 5.0 - 8.0   Glucose, UA NEGATIVE NEGATIVE mg/dL   Hgb urine dipstick NEGATIVE NEGATIVE   Bilirubin Urine NEGATIVE NEGATIVE   Ketones, ur NEGATIVE NEGATIVE mg/dL   Protein, ur NEGATIVE NEGATIVE mg/dL   Nitrite NEGATIVE NEGATIVE   Leukocytes, UA NEGATIVE NEGATIVE    Comment: Performed at [facility] [address], [place], [place] [address]  Type and screen [place]     Status: None   Collection Time: 12/16/17  9:00 AM  Result Value Ref Range   ABO/RH(D) AB POS    Antibody Screen NEG    Sample Expiration 12/19/2017    Unit Number [id number]    Blood Component Type RED CELLS,LR    Unit division 00    Status of Unit ISSUED,FINAL    Transfusion Status OK TO TRANSFUSE    Crossmatch Result       Compatible Performed at [facility], [place] [address] [place], [place] [address]    Unit Number [id number]    Blood Component Type RED CELLS,LR    Unit division 00    Status of Unit ISSUED,FINAL    Transfusion Status OK TO TRANSFUSE    Crossmatch Result Compatible   ABO/Rh     Status: None   Collection Time: 12/16/17  9:00 AM  Result Value Ref Range   ABO/RH(D)      AB POS Performed at [facility], [place] [address], [place], [place] [address]   POC occult blood, ED Provider will collect     Status: None   Collection Time: 12/16/17 11:54 AM  Result Value Ref Range   Fecal Occult Bld NEGATIVE NEGATIVE  Iron and TIBC     Status: Abnormal   Collection Time: 12/16/17  1:50 PM  Result Value Ref Range   Iron 11 (L) 28 - 170 ug/dL   TIBC 517 (H) 250 - 450 ug/dL   Saturation Ratios 2 (L) 10.4 - 31.8 %   UIBC 506 ug/dL    Comment: Performed at [facility], [place] [address], [place], [place] [address]  Ferritin     Status: Abnormal   Collection Time: 12/16/17  1:50 PM  Result Value Ref Range   Ferritin 2 (L) 11 - 307 ng/mL    Comment: Performed at [facility], [place] [address], [place], [place] [address]  Reticulocytes     Status: None   Collection Time: 12/16/17  1:50 PM  Result Value Ref Range   Retic Ct Pct 0.8 0.4 - 3.1 %   RBC. 4.32 3.87 - 5.11 MIL/uL   Retic Count, Absolute 34.6 19.0 - 186.0 K/uL    Comment: Performed at [facility] [address], [place], [place] [address]  Prepare RBC     Status: None   Collection Time: 12/16/17  3:29 PM  Result Value Ref Range   Order Confirmation      ORDER PROCESSED BY BLOOD BANK Performed at [facility], [place] [address], [place], [place] [address]   HIV antibody (Routine Testing)     Status: None   Collection Time: 12/16/17  8:12 PM  Result Value Ref Range   HIV Screen 4th  Generation wRfx Non Reactive Non Reactive    Comment: (NOTE) Performed At: [facility] [place], [place] [id number] Rush Farmer MD AU:[id number] Performed at [facility], [place] [address] [place], [place] [address]   Hemoglobin and hematocrit, blood     Status: Abnormal   Collection Time: 12/16/17  8:12 PM  Result Value Ref Range   Hemoglobin 7.9 (L) 12.0 - 15.0 g/dL    Comment: REPEATED TO VERIFY POST TRANSFUSION SPECIMEN    HCT 27.7 (L) 36.0 - 46.0 %    Comment: Performed at [facility] [address], [place], [place] [address]  CBC     Status: Abnormal   Collection Time: 12/17/17  6:49 AM  Result Value Ref Range   WBC 6.0 4.0 - 10.5 K/uL   RBC 5.34 (H) 3.87 - 5.11 MIL/uL   Hemoglobin 8.0 (L) 12.0 - 15.0 g/dL   HCT 28.0 (L) 36.0 - 46.0 %   MCV 52.4 (L) 78.0 - 100.0 fL    Comment: REPEATED TO VERIFY POST TRANSFUSION SPECIMEN    MCH 15.0 (L) 26.0 - 34.0 pg   MCHC 28.6 (L) 30.0 - 36.0 g/dL   RDW 33.0 (H) 11.5 - 15.5 %   Platelets 313 150 - 400 K/uL    Comment: REPEATED TO VERIFY PLATELET COUNT CONFIRMED BY SMEAR Performed at [facility], [place] [address], [place], [place] [address]   Hemoglobin A1c     Status: None   Collection Time: 12/17/17  6:49 AM  Result Value Ref Range   Hgb A1c MFr Bld 5.3 4.8 - 5.6 %    Comment: (NOTE) Pre diabetes:          5.7%-6.4% Diabetes:              >6.4% Glycemic control for   <7.0% adults with diabetes    Mean Plasma Glucose 105.41 mg/dL    Comment: Performed at [facility] [address] [place], [place] [address]  Basic metabolic panel     Status: Abnormal   Collection Time: 12/17/17  6:49 AM  Result Value Ref Range   Sodium 140 135 - 145 mmol/L   Potassium 3.4 (L) 3.5 - 5.1 mmol/L   Chloride 111 101 - 111 mmol/L   CO2 19 (L) 22 - 32 mmol/L   Glucose, Bld 94 65 - 99 mg/dL   BUN 10 6 - 20 mg/dL   Creatinine, Ser 0.47 0.44 - 1.00 mg/dL   Calcium 8.3 (L) 8.9 - 10.3 mg/dL   GFR calc non Af Amer >60 >60 mL/min   GFR calc Af Amer >60 >60 mL/min    Comment: (NOTE) The eGFR has been  calculated using the CKD EPI equation. This calculation has not been validated in all clinical situations. eGFR's persistently <60 mL/min signify possible Chronic Kidney Disease.    Anion gap 10 5 - 15    Comment: Performed at [facility] [address] [place], [place] [address]    Ct Abdomen Pelvis W Contrast  Result Date: 12/16/2017 CLINICAL DATA:  Pt c/o LLQ pain feels like a large knot that is cramping and contracting pt says her tongue is also numb and rt foot EXAM: CT ABDOMEN AND PELVIS WITH CONTRAST TECHNIQUE: Multidetector CT imaging of the abdomen and pelvis was performed using the standard protocol following bolus administration of intravenous contrast. CONTRAST:  113m ISOVUE-300 IOPAMIDOL (ISOVUE-300) INJECTION 61% COMPARISON:  02/05/2016 and previous FINDINGS:  Lower chest: Partially calcified right pleural plaque. Hepatobiliary: No focal liver abnormality is seen. No gallstones, gallbladder wall thickening, or biliary dilatation. Pancreas: Unremarkable. No pancreatic ductal dilatation or surrounding inflammatory changes. Spleen: Normal in size without focal abnormality. Adrenals/Urinary Tract: Normal adrenals. Stable subcentimeter probable cyst in the mid right kidney. No hydronephrosis or nephrolithiasis. Urinary bladder nondistended. Stomach/Bowel: Stomach is nondistended. Small bowel decompressed. Appendix not discretely identified. Large amount of fecal material and gas distending the colon. There is mild circumferential wall thickening in the splenic flexure, descending and proximal sigmoid segments without significant adjacent inflammatory/edematous change or abscess. Metallic intraluminal linear densities near the rectosigmoid junction, a site of relative narrowing. The rectum is decompressed. Vascular/Lymphatic: Mild Aortic Atherosclerosis (ICD10-170.0) without aneurysm. No abdominal or pelvic adenopathy. Portal vein patent. Reproductive: Uterus and bilateral adnexa are  unremarkable. Other: No ascites.  No free air. Musculoskeletal: No acute or significant osseous findings. IMPRESSION: 1. 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 There are associated metallic linear density intraluminally at this level, which were not present on prior study of 02/05/2016, suggesting interval surgical or colonoscopic intervention versus foreign body. 2. Wall thickening in the descending and sigmoid colon suggesting colitis, possible stercoral given the apparent rectosigmoid stricture. Electronically Signed   By: Lucrezia Europe M.D.   On: 12/16/2017 11:27    Review of Systems  Constitutional: Positive for malaise/fatigue. Negative for chills and fever.  HENT: Negative for hearing loss.   Eyes: Negative for blurred vision and double vision.  Respiratory: Negative for cough and hemoptysis.   Cardiovascular: Negative for chest pain and palpitations.  Gastrointestinal: Positive for abdominal pain. Negative for nausea and vomiting.  Genitourinary: Negative for dysuria and urgency.  Musculoskeletal: Negative for myalgias and neck pain.  Skin: Negative for itching and rash.  Neurological: Negative for dizziness, tingling and headaches.  Endo/Heme/Allergies: Does not bruise/bleed easily.  Psychiatric/Behavioral: Negative for depression and suicidal ideas.   Blood pressure 111/64, pulse 86, temperature 98.2 F (36.8 C), temperature source Oral, resp. rate 18, height '5\' 3"'  (1.6 m), weight 46.7 kg (102 lb 15.3 oz), SpO2 98 %. Physical Exam  Vitals reviewed. Constitutional: She is oriented to person, place, and time. She appears well-developed and well-nourished.  HENT:  Head: Normocephalic and atraumatic.  Eyes: Pupils are equal, round, and reactive to light. Conjunctivae and EOM are normal.  Neck: Normal range of motion. Neck supple.  Cardiovascular: Normal rate and regular rhythm.  Respiratory: Effort normal and breath sounds normal.  GI: Soft. Bowel sounds are  normal. She exhibits no distension. There is tenderness in the left upper quadrant and left lower quadrant. There is no guarding.  Musculoskeletal: Normal range of motion.  Neurological: She is alert and oriented to person, place, and time.  Skin: Skin is warm and dry.  Psychiatric: She has a normal mood and affect. Her behavior is normal.    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 Her CT scan shows no liver masses or lung masses. Her colon appears dilated with stool. The mass was not amenable to stent placement. Given these findings I think proceeding with a partial colectomy with end colostomy is the best option for treatment. We discussed risks of abscess, infection, bleeding, bowel injury, ureter injury, pneumonia, and cardiac disease. We discussed that she would collect her stool in a bag for at least three months and then could have a reversal of her colostomy.  Arta Bruce Maxwell Lemen 12/18/2017, 6:39 AM

## 2017-12-18 NOTE — Consult Note (Signed)
[facility] Nurse requested for preoperative stoma site marking  Discussed surgical procedure and stoma creation with patient and family. Using son for language translation. Explained role of the [place] nurse team.  Provided the patient with educational booklet and provided samples of pouching options.  Examined patient lying, sitting, in order to place the marking in the patient's visual field, away from any creases or abdominal contour issues and within the rectus muscle.    Marked for colostomy in the LLQ  3cm ____ cm to the left of the umbilicus and _1___OX below the umbilicus.   Patient's abdomen cleansed with CHG wipes at site markings, allowed to air dry prior to marking.Covered mark with thin film transparent dressing to preserve mark until date of surgery.   [facility] Nurse team will follow up with patient after surgery for continue ostomy care and teaching.  [place] MSN, [place], [place], [place]

## 2017-12-19 ENCOUNTER — Encounter (HOSPITAL_COMMUNITY): Payer: Self-pay | Admitting: Surgery

## 2017-12-19 DIAGNOSIS — C189 Malignant neoplasm of colon, unspecified: Secondary | ICD-10-CM

## 2017-12-19 DIAGNOSIS — Z9049 Acquired absence of other specified parts of digestive tract: Secondary | ICD-10-CM

## 2017-12-19 DIAGNOSIS — Z933 Colostomy status: Secondary | ICD-10-CM

## 2017-12-19 DIAGNOSIS — R630 Anorexia: Secondary | ICD-10-CM

## 2017-12-19 LAB — BASIC METABOLIC PANEL
Anion gap: 7 (ref 5–15)
CALCIUM: 8.3 mg/dL — AB (ref 8.9–10.3)
CHLORIDE: 109 mmol/L (ref 101–111)
CO2: 18 mmol/L — ABNORMAL LOW (ref 22–32)
CREATININE: 0.57 mg/dL (ref 0.44–1.00)
Glucose, Bld: 180 mg/dL — ABNORMAL HIGH (ref 65–99)
Potassium: 3.8 mmol/L (ref 3.5–5.1)
SODIUM: 134 mmol/L — AB (ref 135–145)

## 2017-12-19 LAB — CBC
HCT: 28 % — ABNORMAL LOW (ref 36.0–46.0)
Hemoglobin: 8.1 g/dL — ABNORMAL LOW (ref 12.0–15.0)
MCH: 15.1 pg — ABNORMAL LOW (ref 26.0–34.0)
MCHC: 28.9 g/dL — ABNORMAL LOW (ref 30.0–36.0)
MCV: 52.1 fL — AB (ref 78.0–100.0)
PLATELETS: 320 10*3/uL (ref 150–400)
RBC: 5.37 MIL/uL — AB (ref 3.87–5.11)
RDW: 33.4 % — AB (ref 11.5–15.5)
WBC: 12.3 10*3/uL — AB (ref 4.0–10.5)

## 2017-12-19 MED ORDER — DOCUSATE SODIUM 100 MG PO CAPS
100.0000 mg | ORAL_CAPSULE | Freq: Two times a day (BID) | ORAL | Status: DC
  Administered 2017-12-19 – 2017-12-22 (×7): 100 mg via ORAL
  Filled 2017-12-19 (×7): qty 1

## 2017-12-19 NOTE — Care Management Note (Addendum)
Case Management Note  Patient Details  Name: Elizabeth Saunders MRN: [id number] Date of Birth: Sep 13, 1955  Subjective/Objective:      Admitted with colonic obstruction.                    4/22  S/p partial colectomy, colostomy  [name] (Son)     [phone]      12/22/2017 NCM made aware per [facility] liaison pt approved for home health services, charity case.  12/21/2017 1005 NCM made referral with [facility], charity case for home health services (RN), pt without insurance.  12/20/2017  @ 1050  Dr.Harbrecht informed NCM pt will [facility] once discharged and they will assist pt with obtaining orange card. NCM will cancel scheduled [facility] appointment with Dr. Wynetta Emery.  PCP: none , pt to f/u with [facility] in  Establishing PCP. NCM to provide pt with information.    Action/Plan: Transition to home when medically stable....NCM following for disposition needs.  Hospital follow up scheduled with Dr. Karle Plumber, May 8th @ 10:30 am. Pt without PCP, no insurance.  Expected Discharge Date:                  Expected Discharge Plan:     In-House Referral:     Discharge planning Services     Post Acute Care Choice:    Choice offered to:     DME Arranged:    DME Agency:     HH Arranged:    HH Agency:     Status of Service:     In process If discussed at Long Length of Stay Meetings, dates discussed:    Additional Comments:  Kiara, Mcd[place], RN 12/19/2017, 4:00 PM

## 2017-12-19 NOTE — Progress Notes (Signed)
Patient stated that she is afraid to use her PCA pump, but was not able to explain exactly why. Explained to patient and daughter that the PCA will not allow her to get too much medication, and that the amounts are controlled. Patient had 7/10 pain and was encouraged to use PCA pump. Patient agreed to use it if pain worsens. Will continue to monitor.
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
## 2017-12-19 NOTE — Consult Note (Signed)
[facility] Nurse ostomy consult note Stoma type/location: LLQ, end colostomy Stomal assessment/size: 2" round, pink, budded  Peristomal assessment: NA Treatment options for stomal/peristomal skin: NA Output none, some flatus Ostomy pouching: 2pc.2 3/4" in place from OR  Education provided:  Explained  creation of stoma  Explained stoma characteristics (budded, flush, color, texture, care) Demonstrated "burping" flatus from pouch Provided patient with [employer], will have next [facility] nurse to mark items needed  Patient is indigent, provided son with indigent information, requested he contact [facility] this week to initiate set up with program  Enrolled patient in [facility] program: Yes  [facility] Nurse will follow along with you for continued support with ostomy teaching and care [place] MSN, [place], [place], [place], [place]

## 2017-12-19 NOTE — Progress Notes (Signed)
  Date: 12/19/2017  Patient name: Elizabeth Saunders  Medical record number: [id number]  Date of birth: 1955/11/13   I have seen and evaluated this patient and I have discussed the plan of care with the house staff. Please see their note for complete details. I concur with their findings with the following additions/corrections:   Doing well POD #1 after partial colonic resection and colostomy, pain appears well-controlled, but she seems a bit stoic. Small amount of liquid output in ostomy bag. Still very little appetite. 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   Lenice Pressman, M.D., Ph.D. 12/19/2017, 3:48 PM

## 2017-12-19 NOTE — Progress Notes (Signed)
1 Day Post-Op   Subjective/Chief Complaint: Pain well controlled. No nausea but not hungry   Objective: Vital signs in last 24 hours: Temp:  [97.1 F (36.2 C)-98.3 F (36.8 C)] 98.3 F (36.8 C) (04/23 0407) Pulse Rate:  [68-93] 68 (04/23 0407) Resp:  [15-23] 16 (04/23 0413) BP: (118-162)/(71-88) 118/71 (04/23 0407) SpO2:  [95 %-100 %] 98 % (04/23 0413) Weight:  [46.7 kg (102 lb 15.3 oz)] 46.7 kg (102 lb 15.3 oz) (04/22 1028) Last BM Date: 12/18/17  Intake/Output from previous day: 04/22 0701 - 04/23 0700 In: 2795.8 [I.V.:2470.8; IV Piggyback:250] Out: 2500 [Urine:2290; Drains:160; Blood:50] Intake/Output this shift: No intake/output data recorded.  General appearance: alert and cooperative Resp: clear to auscultation bilaterally GI: soft, appropriately tender, mildly distended. incision c/d/i. Stoma edematous with some gas in bag. JP SS Skin: Skin color, texture, turgor normal. No rashes or lesions  Lab Results:  Recent Labs    12/18/17 0443 12/19/17 0422  WBC 9.4 12.3*  HGB 8.1* 8.1*  HCT 28.6* 28.0*  PLT 308 320   BMET Recent Labs    12/18/17 0443 12/19/17 0422  NA 138 134*  K 3.6 3.8  CL 113* 109  CO2 13* 18*  GLUCOSE 69 180*  BUN 9 <5*  CREATININE 0.57 0.57  CALCIUM 8.2* 8.3*   PT/INR No results for input(s): LABPROT, INR in the last 72 hours. ABG No results for input(s): PHART, HCO3 in the last 72 hours.  Invalid input(s): PCO2, PO2  Studies/Results: Ct Chest W Contrast  Result Date: 12/18/2017 CLINICAL DATA:  Patient with recent colonic mass. Assess for metastatic disease. EXAM: CT CHEST WITH CONTRAST TECHNIQUE: Multidetector CT imaging of the chest was performed during intravenous contrast administration. CONTRAST:  44mL ISOVUE-300 IOPAMIDOL (ISOVUE-300) INJECTION 61% COMPARISON:  CT abdomen pelvis 12/16/2017. FINDINGS: Cardiovascular: Heart is mildly enlarged. No pericardial effusion. Thoracic aortic vascular calcifications. Mediastinum/Nodes:  No enlarged axillary, mediastinal or hilar lymphadenopathy. Normal esophagus. Lungs/Pleura: Central airways are patent. Dependent atelectasis within the bilateral lower lobes. There is a 5 mm right upper lobe nodule (image 32; series 6). No pleural effusion or pneumothorax. Similar-appearing pleural-based calcification within the right lower hemithorax, suggestive of benign etiology given stability over time and appearance. Similar-appearing bandlike opacity right lower hemithorax (image 110; series 6), favored to represent scarring. Additional smaller pleural based calcification mid right hemithorax (image 70; series 6). Bilateral small pleural based calcifications. Upper Abdomen: No acute process. Subcentimeter too small to characterize low-attenuation lesion interpolar region right kidney (image 144; series 4). Musculoskeletal: No aggressive or acute appearing osseous lesions. IMPRESSION: 1. There is a 5 mm right upper lobe nodule, indeterminate. Recommend attention on follow-up. 2. Bilateral pleural based calcifications, suggestive of prior asbestos exposure. 3. Aortic Atherosclerosis (ICD10-I70.0). Electronically Signed   By: Lovey Newcomer M.D.   On: 12/18/2017 10:12    Anti-infectives: Anti-infectives (From admission, onward)   Start     Dose/Rate Route Frequency Ordered Stop   12/18/17 2000  cefoTEtan (CEFOTAN) 2 g in sodium chloride 0.9 % 100 mL IVPB     2 g 200 mL/hr over 30 Minutes Intravenous  Once 12/18/17 1536 12/18/17 2058   12/18/17 0800  cefoTEtan (CEFOTAN) 2 g in sodium chloride 0.9 % 100 mL IVPB     2 g 200 mL/hr over 30 Minutes Intravenous  Once 12/18/17 0736 12/18/17 0845      Assessment/Plan: s/p Procedure(s): COLON RESECTION (N/A) COLOSTOMY (N/A) d/c foley Advance diet Ambulate, pulm toilet  LOS: 3 days  Elizabeth Saunders 12/19/2017
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
## 2017-12-20 DIAGNOSIS — R197 Diarrhea, unspecified: Secondary | ICD-10-CM

## 2017-12-20 LAB — BASIC METABOLIC PANEL
Anion gap: 6 (ref 5–15)
BUN: <5 mg/dL — ABNORMAL LOW (ref 6–20)
CO2: 23 mmol/L (ref 22–32)
Calcium: 8.4 mg/dL — ABNORMAL LOW (ref 8.9–10.3)
Chloride: 106 mmol/L (ref 101–111)
Creatinine, Ser: 0.49 mg/dL (ref 0.44–1.00)
GFR calc Af Amer: >60 mL/min (ref >60)
GFR calc non Af Amer: >60 mL/min (ref >60)
Glucose, Bld: 137 mg/dL — ABNORMAL HIGH (ref 65–99)
Potassium: 3.8 mmol/L (ref 3.5–5.1)
Sodium: 135 mmol/L (ref 135–145)

## 2017-12-20 LAB — CBC
HCT: 29.8 % — ABNORMAL LOW (ref 36.0–46.0)
Hemoglobin: 8.8 g/dL — ABNORMAL LOW (ref 12.0–15.0)
MCH: 15.4 pg — ABNORMAL LOW (ref 26.0–34.0)
MCHC: 29.5 g/dL — ABNORMAL LOW (ref 30.0–36.0)
MCV: 52.2 fL — ABNORMAL LOW (ref 78.0–100.0)
Platelets: 298 10*3/uL (ref 150–400)
RBC: 5.71 MIL/uL — ABNORMAL HIGH (ref 3.87–5.11)
RDW: 34.2 % — ABNORMAL HIGH (ref 11.5–15.5)
WBC: 9.2 10*3/uL (ref 4.0–10.5)

## 2017-12-20 NOTE — Progress Notes (Addendum)
2 Days Post-Op  Subjective: Alert and cooperative.  Stable.  A little deconditioned.  Son present throughout the encounter. Denies nausea or vomiting.  Tolerating clear liquids.  Passing a little stool and flatus per ostomy Pathology T3, N1b.  Objective: Vital signs in last 24 hours: Temp:  [98.3 F (36.8 C)-98.7 F (37.1 C)] 98.3 F (36.8 C) (04/24 0347) Pulse Rate:  [72-76] 72 (04/24 0347) Resp:  [15-28] 28 (04/24 0439) BP: (137)/(75-81) 137/81 (04/24 0347) SpO2:  [96 %-99 %] 98 % (04/24 0439) Last BM Date: 12/18/17  Intake/Output from previous day: 04/23 0701 - 04/24 0700 In: 4206.3 [P.O.:1050; I.V.:3106.3] Out: 1085 [Urine:1000; Drains:85] Intake/Output this shift: No intake/output data recorded.  General appearance: Alert and cooperative.  Slightly deconditioned.  No distress Resp: clear to auscultation bilaterally GI: Abdomen soft but slightly distended.  Bowel sounds present.  Wound clean.  Stoma pink and edematous.  A little bit of stool and air in bag Extremities: extremities normal, atraumatic, no cyanosis or edema  Lab Results:  No results found for this or any previous visit (from the past 24 hour(s)).   Studies/Results: No results found.  . docusate sodium  100 mg Oral BID  . enoxaparin (LOVENOX) injection  40 mg Subcutaneous Q24H  . morphine   Intravenous Q4H  . mupirocin ointment  1 application Nasal BID     Assessment/Plan: s/p Procedure(s): COLON RESECTION COLOSTOMY  Advance to full liquid.  Probably not ready for solid diet yet. Mobilize. PT consult Pathology T3, N1b - will need oncology consult and possible adjuvant chemotherapy. Lab work tomorrow Discussed overall care plan with son.(pathology not back at time of encounter)  @PROBHOSP @  LOS: 4 days    Adin Hector 12/20/2017  . .prob
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
## 2017-12-20 NOTE — Consult Note (Signed)
[place] Nurse ostomy consult note Stoma type/location: LLQ end colostomy Stomal assessment/size: 2" edematous, pink moist and well budded Patient reports (via son) that she feels some rectal pressure.   Peristomal assessment: intact  Midline abdominal incision with honeycomb dressing overlapped with pouch.  Will have to remove this to perform ostomy care and teaching.  Will replace with ABD pad. Staples are intact and incision is well approximated Treatment options for stomal/peristomal skin: none Output scant amount soft brown effluent Ostomy pouching: 2pc.  2 3/4" pouch. Education provided: Son at bedside and assisted with pouch change today and relayed information to his mother. Pouch removed.  Discussed twice weekly pouch changes.  Lifestyle such as showering and emptying when 1/3 full.  Son cuts the pouch after measuring and snaps pouch and barrier together.  He applies with minimal assistance. Able to open and roll closed.  His wife will be present for pouch change on Friday to assist. THey will be caring for her in their home.  Enrolled patient in [place] program: Yes [place] team will follow for ongoing teaching and ostomy care.  Domenic Moras RN BSN [place] Pager [phone]

## 2017-12-21 ENCOUNTER — Telehealth: Payer: Self-pay | Admitting: Oncology

## 2017-12-21 ENCOUNTER — Encounter: Payer: Self-pay | Admitting: Oncology

## 2017-12-21 DIAGNOSIS — C189 Malignant neoplasm of colon, unspecified: Secondary | ICD-10-CM

## 2017-12-21 LAB — BASIC METABOLIC PANEL
Anion gap: 7 (ref 5–15)
CO2: 21 mmol/L — ABNORMAL LOW (ref 22–32)
CREATININE: 0.47 mg/dL (ref 0.44–1.00)
Calcium: 8.4 mg/dL — ABNORMAL LOW (ref 8.9–10.3)
Chloride: 106 mmol/L (ref 101–111)
GFR calc Af Amer: >60 mL/min (ref >60)
GFR calc non Af Amer: >60 mL/min (ref >60)
GLUCOSE: 124 mg/dL — AB (ref 65–99)
Potassium: 3.8 mmol/L (ref 3.5–5.1)
SODIUM: 134 mmol/L — AB (ref 135–145)

## 2017-12-21 LAB — CBC
HEMATOCRIT: 28.9 % — AB (ref 36.0–46.0)
Hemoglobin: 8.6 g/dL — ABNORMAL LOW (ref 12.0–15.0)
MCH: 15.6 pg — AB (ref 26.0–34.0)
MCHC: 29.8 g/dL — AB (ref 30.0–36.0)
MCV: 52.3 fL — ABNORMAL LOW (ref 78.0–100.0)
PLATELETS: 271 10*3/uL (ref 150–400)
RBC: 5.53 MIL/uL — ABNORMAL HIGH (ref 3.87–5.11)
WBC: 9.6 10*3/uL (ref 4.0–10.5)

## 2017-12-21 MED ORDER — OXYCODONE HCL 5 MG PO TABS
5.0000 mg | ORAL_TABLET | ORAL | Status: DC | PRN
  Administered 2017-12-22 (×2): 10 mg via ORAL
  Filled 2017-12-21 (×2): qty 2

## 2017-12-21 MED ORDER — MORPHINE SULFATE (PF) 2 MG/ML IV SOLN: 1.0000 mg | INTRAVENOUS | Status: DC | PRN

## 2017-12-21 MED ORDER — ACETAMINOPHEN 325 MG PO TABS: 650.0000 mg | ORAL_TABLET | Freq: Four times a day (QID) | ORAL | Status: DC | PRN

## 2017-12-21 MED ORDER — CHLORHEXIDINE GLUCONATE CLOTH 2 % EX PADS
6.0000 | MEDICATED_PAD | Freq: Every day | CUTANEOUS | Status: DC
  Administered 2017-12-21: 6 via TOPICAL

## 2017-12-21 MED ORDER — OXYCODONE HCL 5 MG PO TABS: 5.0000 mg | ORAL_TABLET | ORAL | Status: DC | PRN

## 2017-12-21 MED ORDER — TRAMADOL HCL 50 MG PO TABS: 50.0000 mg | ORAL_TABLET | Freq: Four times a day (QID) | ORAL | Status: DC | PRN

## 2017-12-21 MED ORDER — ACETAMINOPHEN 325 MG PO TABS
650.0000 mg | ORAL_TABLET | Freq: Four times a day (QID) | ORAL | Status: DC
  Administered 2017-12-21: 650 mg via ORAL
  Filled 2017-12-21: qty 2

## 2017-12-21 NOTE — Evaluation (Addendum)
Physical Therapy Evaluation Patient Details Name: Elizabeth Saunders MRN: [id number] DOB: 07-28-1956 Today's Date: 12/21/2017   History of Present Illness  62yo female with weakness and L abdominal pain, anemia and with obstructive mass in colon found on exam in ED. She had a sigmoidoscopy on 12/17/17 and a colon resection with colostomy placement on 12/18/17. PMH anemia   Clinical Impression   Patient received in bed, very pleasant and willing to participate in PT today; of note she does speak some English but daughter was present and assisted in translation as needed during session. She is able to complete functional bed mobility, transfers, and gait with no device today, only very mild unsteadiness noted and patient able to self correct without additional assist from PT. SpO2 remained in the 90s on room air throughout session, RN aware of patient status. She was left up in the chair with daughter present and states that she does not feel that she is at her baseline level of function. She will continue to benefit from skilled PT services in the acute setting as well as skilled HHPT services to further address functional deficits moving forward.     Follow Up Recommendations Home health PT    Equipment Recommendations  None recommended by PT    Recommendations for Other Services       Precautions / Restrictions Precautions Precautions: Other (comment) Precaution Comments: abdominal drain, colostomy bag L side  Restrictions Weight Bearing Restrictions: No      Mobility  Bed Mobility Overal bed mobility: Needs Assistance Bed Mobility: Supine to Sit     Supine to sit: Supervision     General bed mobility comments: S for safety, line management   Transfers Overall transfer level: Needs assistance Equipment used: None Transfers: Sit to/from Stand Sit to Stand: Supervision         General transfer comment: S for safety   Ambulation/Gait Ambulation/Gait assistance:  Supervision Ambulation Distance (Feet): 200 Feet Assistive device: None Gait Pattern/deviations: WFL(Within Functional Limits)     General Gait Details: gait mechanics appear WFL, very mild unsteadiness noted at times but easily able to maintain balance   Stairs            Wheelchair Mobility    Modified Rankin (Stroke Patients Only)       Balance Overall balance assessment: Mild deficits observed, not formally tested                                           Pertinent Vitals/Pain Pain Assessment: No/denies pain    Home Living Family/patient expects to be discharged to:: Private residence Living Arrangements: Children Available Help at Discharge: Family;Available PRN/intermittently Type of Home: House Home Access: Level entry     Home Layout: One level Home Equipment: None      Prior Function Level of Independence: Independent               Hand Dominance        Extremity/Trunk Assessment        Lower Extremity Assessment Lower Extremity Assessment: Overall WFL for tasks assessed    Cervical / Trunk Assessment Cervical / Trunk Assessment: Normal  Communication   Communication: Prefers language other than English  Cognition Arousal/Alertness: Awake/alert Behavior During Therapy: WFL for tasks assessed/performed Overall Cognitive Status: Within Functional Limits for tasks assessed  General Comments      Exercises     Assessment/Plan    PT Assessment Patient needs continued PT services  PT Problem List Decreased strength;Decreased balance       PT Treatment Interventions DME instruction;Therapeutic activities;Gait training;Therapeutic exercise;Patient/family education;Stair training;Balance training;Functional mobility training;Neuromuscular re-education    PT Goals (Current goals can be found in the Care Plan section)  Acute Rehab PT Goals Patient Stated  Goal: to go home  PT Goal Formulation: With patient/family Time For Goal Achievement: 01/04/18 Potential to Achieve Goals: Good    Frequency Min 3X/week   Barriers to discharge        [place]-evaluation               AM-PAC PT "6 Clicks" Daily Activity  Outcome Measure Difficulty turning over in bed (including adjusting bedclothes, sheets and blankets)?: None Difficulty moving from lying on back to sitting on the side of the bed? : None Difficulty sitting down on and standing up from a chair with arms (e.g., wheelchair, bedside commode, etc,.)?: None Help needed moving to and from a bed to chair (including a wheelchair)?: None Help needed walking in hospital room?: None Help needed climbing 3-5 steps with a railing? : A Little 6 Click Score: 23    End of Session   Activity Tolerance: Patient tolerated treatment well Patient left: in chair;with call bell/phone within reach;with family/visitor present Nurse Communication: Mobility status;Other (comment)(maintained in the 90s on room air during gait ) PT Visit Diagnosis: Muscle weakness (generalized) (M62.81);Unsteadiness on feet (R26.81)    Time: 6734-1937 PT Time Calculation (min) (ACUTE ONLY): 26 min   Charges:   PT Evaluation $PT Eval Low Complexity: 1 Low PT Treatments $Gait Training: 8-22 mins   PT G Codes:        Deniece Ree PT, DPT, CBIS  Supplemental Physical Therapist [place]   Pager [phone]

## 2017-12-21 NOTE — Progress Notes (Signed)
Education, explanation and demonstration provided to daughter for emptying and charging Elizabeth Saunders drain, verbalized understanding.

## 2017-12-21 NOTE — Progress Notes (Signed)
Oncology consult requested. Chart reviewed including pathology results and imaging studies.  No need for inpatient oncology evaluation. Will arrange follow up with medical oncology in the next 2 weeks to discuss the role of adjuvant therapy upon patient discharge.

## 2017-12-21 NOTE — Evaluation (Signed)
Occupational Therapy Evaluation Patient Details Name: Elizabeth Saunders MRN: [id number] DOB: 1955/10/18 Today's Date: 12/21/2017    History of Present Illness 62yo female with weakness and L abdominal pain, anemia and with obstructive mass in colon found on exam in ED. She had a sigmoidoscopy on 12/17/17 and a colon resection with colostomy placement on 12/18/17. PMH anemia    Clinical Impression   Pt is moving well and performing ADL with assist for line management only. No OT needs.    Follow Up Recommendations  No OT follow up    Equipment Recommendations  None recommended by OT    Recommendations for Other Services       Precautions / Restrictions Precautions Precaution Comments: abdominal drain, colostomy bag L side       Mobility Bed Mobility Overal bed mobility: Modified Independent             General bed mobility comments: encouraged rolling and side to sit to minimize abdominal pain  Transfers Overall transfer level: Modified independent Equipment used: None             General transfer comment: assisted for line management, no physical assist or difficulty    Balance Overall balance assessment: Independent                                         ADL either performed or assessed with clinical judgement   ADL                                         General ADL Comments: Supervision level to mod I     Vision Baseline Vision/History: Wears glasses Wears Glasses: Reading only Patient Visual Report: No change from baseline       Perception     Praxis      Pertinent Vitals/Pain Pain Assessment: Faces Faces Pain Scale: Hurts a little bit Pain Location: abdomen Pain Descriptors / Indicators: Discomfort Pain Intervention(s): Monitored during session     Hand Dominance Right   Extremity/Trunk Assessment Upper Extremity Assessment Upper Extremity Assessment: Overall WFL for tasks assessed   Lower Extremity  Assessment Lower Extremity Assessment: Overall WFL for tasks assessed   Cervical / Trunk Assessment Cervical / Trunk Assessment: Normal   Communication Communication Communication: Prefers language other than [place];Other (comment)(daughter interpreted)   Cognition Arousal/Alertness: Awake/alert Behavior During Therapy: WFL for tasks assessed/performed Overall Cognitive Status: Within Functional Limits for tasks assessed                                     General Comments       Exercises     Shoulder Instructions      Home Living Family/patient expects to be discharged to:: Private residence Living Arrangements: Children Available Help at Discharge: Family;Available PRN/intermittently Type of Home: House Home Access: Level entry     Home Layout: One level     Bathroom Shower/Tub: Occupational psychologist: Standard     Home Equipment: None          Prior Functioning/Environment Level of Independence: Independent                 OT Problem List:  OT Treatment/Interventions:      OT Goals(Current goals can be found in the care plan section) Acute Rehab OT Goals Patient Stated Goal: to go home   OT Frequency:     Barriers to D/C:            [place]-evaluation              AM-PAC PT "6 Clicks" Daily Activity     Outcome Measure Help from another person eating meals?: None Help from another person taking care of personal grooming?: None Help from another person toileting, which includes using toliet, bedpan, or urinal?: None Help from another person bathing (including washing, rinsing, drying)?: None Help from another person to put on and taking off regular upper body clothing?: None Help from another person to put on and taking off regular lower body clothing?: None 6 Click Score: 24   End of Session    Activity Tolerance: Patient tolerated treatment well Patient left: in chair;with family/visitor  present  OT Visit Diagnosis: Pain                Time: 7867-5449 OT Time Calculation (min): 17 min Charges:  OT General Charges $OT Visit: 1 Visit OT Evaluation $OT Eval Low Complexity: 1 Low G-Codes:     01/10/18 Nestor Lewandowsky, OTR/L Pager: [place], Haze [place] Jan 10, 2018, 4:17 PM

## 2017-12-21 NOTE — Progress Notes (Signed)
Short Surgery Progress Note  3 Days Post-Op  Subjective: CC: abdominal pain Patient reports abdominal pain is moderate and overall well controlled. Tolerating FLD. Having good stool output.  Family not present at bedside, but patient does speak some [place] and understands fairly well. Did not discuss pathology since son is not present to help translate.  UOP good. VSS.   Objective: Vital signs in last 24 hours: Temp:  [97.9 F (36.6 C)-99.1 F (37.3 C)] 97.9 F (36.6 C) (04/25 0758) Pulse Rate:  [74-82] 74 (04/25 0758) Resp:  [16-25] 20 (04/25 0758) BP: (131-141)/(81-86) 131/85 (04/25 0758) SpO2:  [96 %-100 %] 98 % (04/25 0758) FiO2 (%):  [76 %-85 %] 76 % (04/25 0358) Last BM Date: 12/20/17  Intake/Output from previous day: 04/24 0701 - 04/25 0700 In: 2286.7 [P.O.:820; I.V.:1466.7] Out: 830 [Drains:230; Stool:600] Intake/Output this shift: Total I/O In: -  Out: 160 [Drains:60; Stool:100]  PE: Gen:  Alert, NAD, pleasant Card:  Regular rate and rhythm, pedal pulses 2+ BL Pulm:  Normal effort, clear to auscultation bilaterally Abd: Soft, non-tender, non-distended, bowel sounds present in all 4 quadrants, no HSM, incision C/D/I with staples present, drain with serous drainage, soft brown stool in ostomy bag Skin: warm and dry, no rashes  Psych: A&Ox3   Lab Results:  Recent Labs    12/19/17 0422 12/20/17 0803  WBC 12.3* 9.2  HGB 8.1* 8.8*  HCT 28.0* 29.8*  PLT 320 298   BMET Recent Labs    12/20/17 0803 12/21/17 0501  NA 135 134*  K 3.8 3.8  CL 106 106  CO2 23 21*  GLUCOSE 137* 124*  BUN <5* <5*  CREATININE 0.49 0.47  CALCIUM 8.4* 8.4*   PT/INR No results for input(s): LABPROT, INR in the last 72 hours. CMP     Component Value Date/Time   NA 134 (L) 12/21/2017 0501   K 3.8 12/21/2017 0501   CL 106 12/21/2017 0501   CO2 21 (L) 12/21/2017 0501   GLUCOSE 124 (H) 12/21/2017 0501   BUN <5 (L) 12/21/2017 0501   CREATININE 0.47 12/21/2017  0501   CALCIUM 8.4 (L) 12/21/2017 0501   PROT 7.9 12/16/2017 0755   ALBUMIN 3.5 12/16/2017 0755   AST 17 12/16/2017 0755   ALT 9 (L) 12/16/2017 0755   ALKPHOS 94 12/16/2017 0755   BILITOT 0.5 12/16/2017 0755   GFRNONAA >60 12/21/2017 0501   GFRAA >60 12/21/2017 0501   Lipase     Component Value Date/Time   LIPASE 36 12/16/2017 0755       Studies/Results: No results found.  Anti-infectives: Anti-infectives (From admission, onward)   Start     Dose/Rate Route Frequency Ordered Stop   12/18/17 2000  cefoTEtan (CEFOTAN) 2 g in sodium chloride 0.9 % 100 mL IVPB     2 g 200 mL/hr over 30 Minutes Intravenous  Once 12/18/17 1536 12/18/17 2058   12/18/17 0800  cefoTEtan (CEFOTAN) 2 g in sodium chloride 0.9 % 100 mL IVPB     2 g 200 mL/hr over 30 Minutes Intravenous  Once 12/18/17 0736 12/18/17 0845       Assessment/Plan S/P colon resection and colostomy 4/22 Dr. Kae Heller - POD#3 - path significant for invasive adenocarcinoma, T3, N1b - needs oncology consult - d/c PCA - will start on PO pain regimen - advance to soft diet  FEN: soft diet, decrease IVF VTE: SCDs, lovenox ID: [email or web address] 4/22  Plan: advance diet, will need to discuss pathology when son present.  D/c PCA and get pain controlled on PO regimen. Likely ready for discharge in the next 1-2 days from a surgical standpoint.   LOS: 5 days    Brigid Re , [facility] Surgery 12/21/2017, 8:16 AM Pager: [phone] Consults: [phone] Mon-Fri 7:00 am-4:30 pm Sat-Sun 7:00 am-11:30 am

## 2017-12-21 NOTE — Progress Notes (Signed)
Wasted 8.45ml of Morphine from a PCA down the sink with Cristopher Peru, Rn

## 2017-12-21 NOTE — Telephone Encounter (Signed)
An appt has been scheduled for the pt to see Dr. Benay Spice on 5/9 at 2pm. Pt is currently in the hospital. Letter mailed.

## 2017-12-21 NOTE — Plan of Care (Signed)
Pt is responding well to treatment no new complain will continue to monitor
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
## 2017-12-22 ENCOUNTER — Telehealth: Payer: Self-pay | Admitting: Oncology

## 2017-12-22 DIAGNOSIS — C779 Secondary and unspecified malignant neoplasm of lymph node, unspecified: Secondary | ICD-10-CM

## 2017-12-22 LAB — BASIC METABOLIC PANEL
Anion gap: 8 (ref 5–15)
BUN: <5 mg/dL — ABNORMAL LOW (ref 6–20)
CO2: 24 mmol/L (ref 22–32)
Calcium: 8.4 mg/dL — ABNORMAL LOW (ref 8.9–10.3)
Chloride: 104 mmol/L (ref 101–111)
Creatinine, Ser: 0.49 mg/dL (ref 0.44–1.00)
GFR calc Af Amer: >60 mL/min (ref >60)
GFR calc non Af Amer: >60 mL/min (ref >60)
Glucose, Bld: 111 mg/dL — ABNORMAL HIGH (ref 65–99)
Potassium: 3.9 mmol/L (ref 3.5–5.1)
Sodium: 136 mmol/L (ref 135–145)

## 2017-12-22 LAB — CBC
HCT: 28.3 % — ABNORMAL LOW (ref 36.0–46.0)
Hemoglobin: 8.3 g/dL — ABNORMAL LOW (ref 12.0–15.0)
MCH: 15.3 pg — ABNORMAL LOW (ref 26.0–34.0)
MCHC: 29.3 g/dL — ABNORMAL LOW (ref 30.0–36.0)
MCV: 52 fL — ABNORMAL LOW (ref 78.0–100.0)
Platelets: 326 10*3/uL (ref 150–400)
RBC: 5.44 MIL/uL — ABNORMAL HIGH (ref 3.87–5.11)
WBC: 7.6 10*3/uL (ref 4.0–10.5)

## 2017-12-22 MED ORDER — SENNA 8.6 MG PO TABS: 1.0000 | ORAL_TABLET | Freq: Every day | ORAL | Status: DC | PRN

## 2017-12-22 MED ORDER — ONDANSETRON HCL 4 MG/2ML IJ SOLN: 4.0000 mg | Freq: Four times a day (QID) | INTRAMUSCULAR | Status: DC | PRN

## 2017-12-22 MED ORDER — FERROUS SULFATE 325 (65 FE) MG PO TABS: 325.0000 mg | ORAL_TABLET | Freq: Every day | ORAL | 3 refills | Status: DC

## 2017-12-22 MED ORDER — OXYCODONE HCL 5 MG PO TABS: 5.0000 mg | ORAL_TABLET | ORAL | 0 refills | Status: DC | PRN

## 2017-12-22 MED ORDER — FERROUS SULFATE 325 (65 FE) MG PO TABS
325.0000 mg | ORAL_TABLET | Freq: Every day | ORAL | Status: DC
  Administered 2017-12-22: 325 mg via ORAL
  Filled 2017-12-22: qty 1

## 2017-12-22 MED ORDER — ACETAMINOPHEN 325 MG PO TABS: 650.0000 mg | ORAL_TABLET | Freq: Four times a day (QID) | ORAL | 0 refills | Status: DC | PRN

## 2017-12-22 NOTE — Telephone Encounter (Signed)
Spoke to the pt's son and rescheduled his mom's appt to 4/30 to see Dr. Benay Spice. Elizabeth Saunders has agreed to the appt date and time.
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
## 2017-12-22 NOTE — Progress Notes (Signed)
PT Cancellation Note  Patient Details Name: Elizabeth Saunders MRN: [id number] DOB: 15-Apr-1956   Cancelled Treatment:    Reason Eval/Treat Not Completed: Other (comment)(Chart reviewed, RN consulted. RN reporting Pt has been up and ambulatory twice today already, over 351f. ) Patient is performing all mobility at supervision level or greater at this time. No additional skilled PT services needed at this time, PT signing off. PT recommends daily ambulation ad lib or with nursing staff as needed to prevent deconditioning. Additional needs can be met at the next venue of care.   1:47 PM, 12/22/17 AEtta Grandchild PT, DPT Physical Therapist - [phone](Pager)  [phone](Office)      Buccola,Allan C 12/22/2017, 1:46 PM
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
## 2017-12-22 NOTE — Discharge Instructions (Signed)
Thank you for your visit to the [facility].  IF at any time you were to develop pain, fever, chills, nausea, or vomiting please call your surgeon and or our clinic.     [facility], [place] [phone]  OPEN ABDOMINAL SURGERY: POST OP INSTRUCTIONS  Always review your discharge instruction sheet given to you by the facility where your surgery was performed.  IF YOU HAVE DISABILITY OR FAMILY LEAVE FORMS, YOU MUST BRING THEM TO THE OFFICE FOR PROCESSING.  PLEASE DO NOT GIVE THEM TO YOUR DOCTOR.  1. A prescription for pain medication may be given to you upon discharge.  Take your pain medication as prescribed, if needed.  If narcotic pain medicine is not needed, then you may take acetaminophen (Tylenol) or ibuprofen (Advil) as needed. 2. Take your usually prescribed medications unless otherwise directed. 3. If you need a refill on your pain medication, please contact your pharmacy. They will contact our office to request authorization.  Prescriptions will not be filled after 5pm or on week-ends. 4. You should follow a light diet the first few days after arrival home, such as soup and crackers, pudding, etc.unless your doctor has advised otherwise. A high-fiber, low fat diet can be resumed as tolerated.   Be sure to include lots of fluids daily. Most patients will experience some swelling and bruising on the chest and neck area.  Ice packs will help.  Swelling and bruising can take several days to resolve 5. Most patients will experience some swelling and bruising in the area of the incision. Ice pack will help. Swelling and bruising can take several days to resolve..  6. It is common to experience some constipation if taking pain medication after surgery.  Increasing fluid intake and taking a stool softener will usually help or prevent this problem from occurring.  A mild laxative (Milk of Magnesia or Miralax) should be taken according to package directions if there are no  bowel movements after 48 hours. 7.  You may have steri-strips (small skin tapes) in place directly over the incision.  These strips should be left on the skin for 7-10 days.  If your surgeon used skin glue on the incision, you may shower in 24 hours.  The glue will flake off over the next 2-3 weeks.  Any sutures or staples will be removed at the office during your follow-up visit. You may find that a light gauze bandage over your incision may keep your staples from being rubbed or pulled. You may shower and replace the bandage daily. 8. ACTIVITIES:  You may resume regular (light) daily activities beginning the next day--such as daily self-care, walking, climbing stairs--gradually increasing activities as tolerated.  You may have sexual intercourse when it is comfortable.  Refrain from any heavy lifting or straining until approved by your doctor. a. You may drive when you no longer are taking prescription pain medication, you can comfortably wear a seatbelt, and you can safely maneuver your car and apply brakes b. Return to Work: ___________________________________ 108. You should see your doctor in the office for a follow-up appointment approximately two weeks after your surgery.  Make sure that you call for this appointment within a day or two after you arrive home to insure a convenient appointment time. OTHER INSTRUCTIONS:  _____________________________________________________________ _____________________________________________________________  WHEN TO CALL YOUR DOCTOR: 1. Fever over 101.0 2. Inability to urinate 3. Nausea and/or vomiting 4. Extreme swelling or bruising 5. Continued bleeding from incision.  6. Increased pain, redness, or drainage from the incision. 7. Difficulty swallowing or breathing 8. Muscle cramping or spasms. 9. Numbness or tingling in hands or feet or around lips.  The clinic staff is available to answer your questions during regular business hours.  Please dont  hesitate to call and ask to speak to one of the nurses if you have concerns.  For further questions, please visit [email or web address]

## 2017-12-22 NOTE — Consult Note (Addendum)
[facility] Nurse ostomy follow-up consult note Stoma type/location: LLQ end colostomy Stomal assessment/size: 1 3/4 inches edematous, edematous, red and moist, above skin level Output:  Mod amt semiformed brown stool Ostomy pouching: 2pc.  2 3/4" pouch. Education provided: Son at bedside and assisted with pouch change and translation of instructions for his mother. Pt was able to open and close velcro to empty and both asked appropriate questions.  Reviewed pouching routines and ordering supplies; son has contacted Chiropractor regarding their indigent assistance program by phone. Discussed lifestyle such as showering and emptying when 1/3 full.  Son cuts the pouch after measuring and snaps pouch and barrier together.  5 sets of barriers and wafers ordered to the room for bedside nurse use and for family after discharge. Enrolled patient in [place] program: Yes [place] team will continue to follow for teaching sessions while in the hospital. Julien Girt MSN, RN, [place], [place], [place]

## 2017-12-22 NOTE — Progress Notes (Signed)
[facility] Progress Note  4 Days Post-Op  Subjective: CC: abdominal pain Had some nausea yesterday afternoon but overall continues to do well. Walking, good urine output, pain well controlled.  Objective: Vital signs in last 24 hours: Temp:  [97.7 F (36.5 C)-98.8 F (37.1 C)] 98.4 F (36.9 C) (04/26 0511) Pulse Rate:  [74-83] 75 (04/26 0511) Resp:  [16-23] 17 (04/26 0511) BP: (115-131)/(79-85) 128/79 (04/26 0511) SpO2:  [96 %-100 %] 98 % (04/26 0511) Weight:  [46.1 kg (101 lb 10.1 oz)] 46.1 kg (101 lb 10.1 oz) (04/26 0511) Last BM Date: 12/20/17  Intake/Output from previous day: 04/25 0701 - 04/26 0700 In: 1130 [P.O.:530; I.V.:600] Out: 400 [Drains:100; Stool:300] Intake/Output this shift: No intake/output data recorded.  PE: Gen:  Alert, NAD, pleasant Card:  Regular rate and rhythm, pedal pulses 2+ BL Pulm:  Normal effort, clear to auscultation bilaterally Abd: Soft, non-tender, mildly distended. incision C/D/I with staples present, drain with serous drainage, soft brown stool in ostomy bag Skin: warm and dry, no rashes  Psych: A&Ox3   Lab Results:  Recent Labs    12/21/17 0501 12/22/17 0555  WBC 9.6 7.6  HGB 8.6* 8.3*  HCT 28.9* PENDING  PLT 271 PENDING   BMET Recent Labs    12/21/17 0501 12/22/17 0555  NA 134* 136  K 3.8 3.9  CL 106 104  CO2 21* 24  GLUCOSE 124* 111*  BUN <5* <5*  CREATININE 0.47 0.49  CALCIUM 8.4* 8.4*   PT/INR No results for input(s): LABPROT, INR in the last 72 hours. CMP     Component Value Date/Time   NA 136 12/22/2017 0555   K 3.9 12/22/2017 0555   CL 104 12/22/2017 0555   CO2 24 12/22/2017 0555   GLUCOSE 111 (H) 12/22/2017 0555   BUN <5 (L) 12/22/2017 0555   CREATININE 0.49 12/22/2017 0555   CALCIUM 8.4 (L) 12/22/2017 0555   PROT 7.9 12/16/2017 0755   ALBUMIN 3.5 12/16/2017 0755   AST 17 12/16/2017 0755   ALT 9 (L) 12/16/2017 0755   ALKPHOS 94 12/16/2017 0755   BILITOT 0.5 12/16/2017 0755   GFRNONAA >60 12/22/2017 0555   GFRAA >60 12/22/2017 0555   Lipase     Component Value Date/Time   LIPASE 36 12/16/2017 0755       Studies/Results: No results found.  Anti-infectives: Anti-infectives (From admission, onward)   Start     Dose/Rate Route Frequency Ordered Stop   12/18/17 2000  cefoTEtan (CEFOTAN) 2 g in sodium chloride 0.9 % 100 mL IVPB     2 g 200 mL/hr over 30 Minutes Intravenous  Once 12/18/17 1536 12/18/17 2058   12/18/17 0800  cefoTEtan (CEFOTAN) 2 g in sodium chloride 0.9 % 100 mL IVPB     2 g 200 mL/hr over 30 Minutes Intravenous  Once 12/18/17 0736 12/18/17 0845       Assessment/Plan S/P colon resection and colostomy 4/22 Dr. Kae Heller - POD#4 - path significant for invasive adenocarcinoma, T3, N1b - I have discussed this with the patient and her son. We discussed the need for close outpatient oncology follow-up, best to arrange this prior to discharge   FEN: soft diet, decrease IVF VTE: SCDs, lovenox ID: [email or web address] 4/22  Plan:  From a surgical standpoint, okay for patient to be discharged. She will need outpatient oncology follow-up in the next 1-2 weeks, and likely needs home health care to assist with ostomy care.   LOS: 6 days    Ryley Teater  Rich Brave , [facility] 12/22/2017, 7:36 AM

## 2017-12-22 NOTE — Progress Notes (Signed)
CSW received consult regarding insurance assistance. CSW notes Financial Counseling is following patient for needs. CSW signing off as no CSW needs at this time.   Elizabeth Saunders Elizabeth Armacost LCSW [phone]

## 2017-12-26 ENCOUNTER — Telehealth: Payer: Self-pay | Admitting: Family Medicine

## 2017-12-26 ENCOUNTER — Inpatient Hospital Stay: Payer: Medicaid Other | Attending: Oncology | Admitting: Oncology

## 2017-12-26 ENCOUNTER — Telehealth: Payer: Self-pay | Admitting: Oncology

## 2017-12-26 VITALS — BP 117/74 | HR 72 | Temp 98.4°F | Resp 16 | Ht 62.99 in | Wt 98.0 lb

## 2017-12-26 DIAGNOSIS — R911 Solitary pulmonary nodule: Secondary | ICD-10-CM | POA: Insufficient documentation

## 2017-12-26 DIAGNOSIS — Z933 Colostomy status: Secondary | ICD-10-CM | POA: Diagnosis not present

## 2017-12-26 DIAGNOSIS — D509 Iron deficiency anemia, unspecified: Secondary | ICD-10-CM | POA: Insufficient documentation

## 2017-12-26 DIAGNOSIS — Z79899 Other long term (current) drug therapy: Secondary | ICD-10-CM | POA: Diagnosis not present

## 2017-12-26 DIAGNOSIS — C189 Malignant neoplasm of colon, unspecified: Secondary | ICD-10-CM

## 2017-12-26 DIAGNOSIS — C19 Malignant neoplasm of rectosigmoid junction: Secondary | ICD-10-CM | POA: Insufficient documentation

## 2017-12-26 NOTE — Telephone Encounter (Signed)
Scheduled appt per 4/30 los - Gave patient AVS and calender per los.  

## 2017-12-26 NOTE — Telephone Encounter (Signed)
Copied from [place] [id number]. Topic: Quick Communication - See Telephone Encounter >> Dec 26, 2017 12:58 PM Robina Ade, Helene Kelp D wrote: CRM for notification. See Telephone encounter for: 12/26/17. Baxter Flattery from [place] disability called and said that they did not received all the pages send to them for patients disability forms, if they can be resend fax to [phone] and her number call back number is [phone].

## 2017-12-26 NOTE — Progress Notes (Signed)
START ON PATHWAY REGIMEN - Colorectal     A cycle is every 14 days:     Oxaliplatin      Leucovorin      5-Fluorouracil      5-Fluorouracil   **Always confirm dose/schedule in your pharmacy ordering system**    Patient Characteristics: Rectal Neoadjuvant/Adjuvant, T3 - T4, N0 or Any T, N+, Postoperative - No Neoadjuvant Therapy - Node Positive or T4 Current evidence of distant metastases<= No AJCC T Category: Staged < 8th Ed. AJCC N Category: Staged < 8th Ed. AJCC M Category: Staged < 8th Ed. AJCC 8 Stage Grouping: Staged < 8th Ed. Intent of Therapy: Curative Intent, Discussed with Patient
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
## 2017-12-27 ENCOUNTER — Telehealth: Payer: Self-pay

## 2017-12-27 NOTE — Progress Notes (Signed)
[facility] triage nurse and left message requesting Port placement by Dr. Windle Guard. Chemo to start on 01/15/18.

## 2017-12-27 NOTE — Telephone Encounter (Signed)
Error

## 2017-12-27 NOTE — Progress Notes (Signed)
  Oncology Nurse Navigator Documentation  Navigator Location: [facility] (12/26/17 1500) Referral date to RadOnc/MedOnc: 12/21/17 (12/26/17 1500) )Navigator Encounter Type: Initial MedOnc (12/26/17 1500)   Abnormal Finding Date: 12/16/17 (12/26/17 1500) Confirmed Diagnosis Date: 12/17/17 (12/26/17 1500) Surgery Date: 12/18/17 (12/26/17 1500)           Treatment Initiated Date: 12/18/17 (12/26/17 1500) Patient Visit Type: Initial;MedOnc (12/26/17 1500) Treatment Phase: Pre-Tx/Tx Discussion (12/26/17 1500) Barriers/Navigation Needs: (Language Barriers) (12/26/17 1500)   Interventions: Other(Interpreter) (12/26/17 1500) Met with patient (does not speak [place]), daughter-in-law (who speak english) and interpreter to explain the role of [place] navigator. I had spoken to patient's son prior to the appointment to introduce myself as well. I explained to patient that we would be providing education about treatments and procedures  As treatment progresses and that her son can call me at any time to clarify what is being recommended. Patient has transportation, lives with her son, daughter-in-law and grandson. I provided information from [email or web address] on colon cancer treatment and staging to patient and asked that her son review information with her. I encouraged daughter-in-law to reach out to [place] with questions or concerns.            Acuity: Level 2 (12/26/17 1500)         Time Spent with Patient: 75 (12/26/17 1500)

## 2017-12-28 ENCOUNTER — Other Ambulatory Visit (HOSPITAL_COMMUNITY): Payer: Self-pay | Admitting: Surgery

## 2017-12-28 ENCOUNTER — Inpatient Hospital Stay: Payer: Medicaid Other | Attending: Oncology

## 2017-12-28 DIAGNOSIS — Z5111 Encounter for antineoplastic chemotherapy: Secondary | ICD-10-CM | POA: Insufficient documentation

## 2017-12-28 DIAGNOSIS — Z9049 Acquired absence of other specified parts of digestive tract: Secondary | ICD-10-CM | POA: Insufficient documentation

## 2017-12-28 DIAGNOSIS — Z933 Colostomy status: Secondary | ICD-10-CM | POA: Insufficient documentation

## 2017-12-28 DIAGNOSIS — C19 Malignant neoplasm of rectosigmoid junction: Secondary | ICD-10-CM | POA: Insufficient documentation

## 2017-12-28 DIAGNOSIS — C189 Malignant neoplasm of colon, unspecified: Secondary | ICD-10-CM

## 2017-12-28 DIAGNOSIS — D649 Anemia, unspecified: Secondary | ICD-10-CM | POA: Insufficient documentation

## 2017-12-28 MED ORDER — LIDOCAINE-PRILOCAINE 2.5-2.5 % EX CREA: TOPICAL_CREAM | CUTANEOUS | 3 refills | Status: DC

## 2017-12-29 ENCOUNTER — Ambulatory Visit (INDEPENDENT_AMBULATORY_CARE_PROVIDER_SITE_OTHER): Payer: Medicaid Other | Admitting: Internal Medicine

## 2017-12-29 ENCOUNTER — Other Ambulatory Visit: Payer: Self-pay

## 2017-12-29 VITALS — BP 100/68 | HR 83 | Temp 98.2°F | Wt 93.9 lb

## 2017-12-29 DIAGNOSIS — D509 Iron deficiency anemia, unspecified: Secondary | ICD-10-CM | POA: Diagnosis not present

## 2017-12-29 DIAGNOSIS — D638 Anemia in other chronic diseases classified elsewhere: Secondary | ICD-10-CM

## 2017-12-29 DIAGNOSIS — Z9889 Other specified postprocedural states: Secondary | ICD-10-CM | POA: Diagnosis not present

## 2017-12-29 DIAGNOSIS — C188 Malignant neoplasm of overlapping sites of colon: Secondary | ICD-10-CM | POA: Diagnosis not present

## 2017-12-29 DIAGNOSIS — C779 Secondary and unspecified malignant neoplasm of lymph node, unspecified: Secondary | ICD-10-CM | POA: Diagnosis not present

## 2017-12-29 DIAGNOSIS — C189 Malignant neoplasm of colon, unspecified: Secondary | ICD-10-CM

## 2017-12-29 NOTE — Progress Notes (Signed)
   CC: Hospital follow-up for colon cancer  HPI:[name] is a 62 y.o. female who presents for follow-up evaluation of recently diagnosed colon cancer secondary to colonic obstruction.  Adenocarcinoma of colon: The patient recently presented to [facility] emergency department for constipation, abdominal pain, and generalized fatigue.  CBC at that time indicated a severe microcytic anemia consistent with iron deficiency and a ferritin of 2.  She was transfused multiple units of PRBCs and underwent endoscopy.  A large colonic obstruction/mass was observed and surgery was performed that resulted in resection of the mass and pathology report as seen below; Colon, segmental resection for tumor, Rectosigmoid - INVASIVE COLORECTAL ADENOCARCINOMA, 4.4 CM. - TUMOR EXTENDS INTO PERIRECTAL CONNECTIVE TISSUE. - MARGINS NOT INVOLVED. - METASTATIC CARCINOMA IN TWO OF SEVENTEEN LYMPH NODES (2/17)  The patient's pain was well controlled during her admission and she progressed to solid foods prior to discharge.  She was to follow-up with oncology on Jan 01, 2018 for evaluation potential further treatment. CT chest abdomen pelvis failed to reveal metastatic mass other than a 65mm RUL nodule of indeterminate origin. She is tolerating oral intake well with minimal pain and continues to defer utilization of analgesics. She continues to perform her ADLs with mild impairment. Patient's ostomy appears clean, well-healing, with no evidence of edema, erythema, or surgical site infection.  As per patient she is to have her staples removed by surgery week following this appointment.  Patient has she feels well, denies fever, chills, nausea, vomiting, diarrhea, constipation, abdominal pain, myalgias, cough, visual changes, headache, chest pain, or other concerning symptoms at this time.  Plan: -Patient is to follow with oncology on 01/15/2018 -CBC today indicated hemoglobin of 9.2, MCV 56 both indicating mild improvement.   She however, her platelet count 746K most likely secondary to severe iron deficiency anemia.  -Continue to follow her Hgb With CBC in one month as well as one in two weeks with Oncology  Iron deficiency anemia: As per Colon cancer. The patient's anemia improved to 8.3 by discharge she was released with iron supplementation up from 5.4 with and MCV of 44.8. Her iron deficiency anemia was present a year prior but not previously evaluated or treated.   Plan: -CBC today, improvement in Hgb and MCV noted today to 9.2 and 56. -Repeat CBC at next visit.  -Continue ferrous sulfate   Past Medical History:  Diagnosis Date  . Anemia 12/16/2017   Review of Systems: ROS negative except as per HPI  Physical Exam:  Vitals:   12/29/17 1558  BP: 100/68  Pulse: 83  Temp: 98.2 F (36.8 C)  TempSrc: Oral  SpO2: 100%  Weight: 93 lb 14.4 oz (42.6 kg)   General: No acute distress, alert oriented, afebrile, nondiaphoretic, appears comfortable, Cardio: RRR there is a mild but improved murmur best heard at the left upper sternal border Pulmonary: Lungs are clear to auscultation bilaterally with no wheezing rhonchi rales GI: Abdomen is soft, nontender, nondistended, surgical site appears clean, there is no no evidence of erythema, edema, or tenderness The surgical site  Assessment & Plan:   See Encounters Tab for problem based charting.  Patient discussed with Dr. Rebeca Alert

## 2017-12-29 NOTE — Patient Instructions (Signed)
FOLLOW-UP INSTRUCTIONS When: One month to establish with a primary car provider For: Routine visit What to bring: Your medications  Thank you for your visit to the [facility] today.  I am glad to see that you are feeling better.  Please continue to take the ferrous tablets you were prescribed on discharge. If at any time you develop pain, please let us know.  If you have any questions please let us know, you may call at any time 24/7.

## 2017-12-30 LAB — CBC WITH DIFFERENTIAL/PLATELET
Basophils Absolute: 0 10*3/uL (ref 0.0–0.2)
Basos: 0 %
EOS (ABSOLUTE): 0.4 10*3/uL (ref 0.0–0.4)
Eos: 5 %
Hematocrit: 32.9 % — ABNORMAL LOW (ref 34.0–46.6)
Hemoglobin: 9.2 g/dL — ABNORMAL LOW (ref 11.1–15.9)
Immature Grans (Abs): 0 10*3/uL (ref 0.0–0.1)
Immature Granulocytes: 0 %
Lymphocytes Absolute: 2.2 10*3/uL (ref 0.7–3.1)
Lymphs: 24 %
MCH: 15.6 pg — ABNORMAL LOW (ref 26.6–33.0)
MCHC: 28 g/dL — ABNORMAL LOW (ref 31.5–35.7)
MCV: 56 fL — ABNORMAL LOW (ref 79–97)
Monocytes Absolute: 0.8 10*3/uL (ref 0.1–0.9)
Monocytes: 9 %
Neutrophils Absolute: 5.9 10*3/uL (ref 1.4–7.0)
Neutrophils: 62 %
Platelets: 746 10*3/uL — ABNORMAL HIGH (ref 150–379)
RBC: 5.89 x10E6/uL — ABNORMAL HIGH (ref 3.77–5.28)
RDW: 35.5 % — ABNORMAL HIGH (ref 12.3–15.4)
WBC: 9.3 10*3/uL (ref 3.4–10.8)

## 2018-01-01 ENCOUNTER — Encounter: Payer: Self-pay | Admitting: Internal Medicine

## 2018-01-01 ENCOUNTER — Other Ambulatory Visit: Payer: Self-pay | Admitting: Radiology

## 2018-01-01 DIAGNOSIS — D638 Anemia in other chronic diseases classified elsewhere: Secondary | ICD-10-CM | POA: Insufficient documentation

## 2018-01-01 NOTE — Assessment & Plan Note (Addendum)
Adenocarcinoma of colon: The patient recently presented to [facility] emergency department for constipation, abdominal pain, and generalized fatigue.  CBC at that time indicated a severe microcytic anemia consistent with iron deficiency and a ferritin of 2.  She was transfused multiple units of PRBCs and underwent endoscopy.  A large colonic obstruction/mass was observed and surgery was performed that resulted in resection of the mass and pathology report as seen below; Colon, segmental resection for tumor, Rectosigmoid - INVASIVE COLORECTAL ADENOCARCINOMA, 4.4 CM. - TUMOR EXTENDS INTO PERIRECTAL CONNECTIVE TISSUE. - MARGINS NOT INVOLVED. - METASTATIC CARCINOMA IN TWO OF SEVENTEEN LYMPH NODES (2/17)  The patient's pain was well controlled during her admission and she progressed to solid foods prior to discharge.  She was to follow-up with oncology on Jan 01, 2018 for evaluation potential further treatment. CT chest abdomen pelvis failed to reveal metastatic mass other than a 81mm RUL nodule of indeterminate origin. She is tolerating oral intake well with minimal pain and continues to defer utilization of analgesics. She continues to perform her ADLs with mild impairment. Patient's ostomy appears clean, well-healing, with no evidence of edema, erythema, or surgical site infection.  As per patient she is to have her staples removed by surgery week following this appointment.  Patient has she feels well, denies fever, chills, nausea, vomiting, diarrhea, constipation, abdominal pain, myalgias, cough, visual changes, headache, chest pain, or other concerning symptoms at this time.  Plan: -Patient is to follow with oncology on 01/15/2018 -CBC today indicated hemoglobin of 9.2, MCV 56 both indicating mild improvement.  She however, her platelet count 746K most likely secondary to severe iron deficiency anemia.  -Continue to follow her Hgb With CBC in one month as well as one in two weeks with Oncology

## 2018-01-01 NOTE — Assessment & Plan Note (Addendum)
Iron deficiency anemia: As per Colon cancer. The patient's anemia improved to 8.3 by discharge she was released with iron supplementation up from 5.4 with and MCV of 44.8. Her iron deficiency anemia was present a year prior but not previously evaluated or treated.   Plan: -CBC today, improvement in Hgb and MCV noted today to 9.2 and 56. -Repeat CBC at next visit.  -Continue ferrous sulfate

## 2018-01-01 NOTE — Progress Notes (Signed)
Internal Medicine Clinic Attending  Case discussed with Dr. Harbrecht  at the time of the visit.  We reviewed the resident's history and exam and pertinent patient test results.  I agree with the assessment, diagnosis, and plan of care documented in the resident's note.  Alexander N Raines, MD   

## 2018-01-02 ENCOUNTER — Other Ambulatory Visit (HOSPITAL_COMMUNITY): Payer: Self-pay | Admitting: Surgery

## 2018-01-02 ENCOUNTER — Encounter: Payer: Self-pay | Admitting: Pharmacy Technician

## 2018-01-02 ENCOUNTER — Encounter (HOSPITAL_COMMUNITY): Payer: Self-pay

## 2018-01-02 ENCOUNTER — Ambulatory Visit (HOSPITAL_COMMUNITY)
Admission: RE | Admit: 2018-01-02 | Discharge: 2018-01-02 | Disposition: A | Discharge status: Home / Self Care | Payer: Medicaid Other | Source: Ambulatory Visit | Attending: Surgery | Admitting: Surgery

## 2018-01-02 DIAGNOSIS — Z933 Colostomy status: Secondary | ICD-10-CM | POA: Insufficient documentation

## 2018-01-02 DIAGNOSIS — Z79899 Other long term (current) drug therapy: Secondary | ICD-10-CM | POA: Diagnosis not present

## 2018-01-02 DIAGNOSIS — D649 Anemia, unspecified: Secondary | ICD-10-CM | POA: Insufficient documentation

## 2018-01-02 DIAGNOSIS — Z9889 Other specified postprocedural states: Secondary | ICD-10-CM | POA: Diagnosis not present

## 2018-01-02 DIAGNOSIS — C189 Malignant neoplasm of colon, unspecified: Secondary | ICD-10-CM | POA: Diagnosis present

## 2018-01-02 HISTORY — PX: IR FLUORO GUIDE PORT INSERTION RIGHT: IMG5741

## 2018-01-02 HISTORY — DX: Malignant (primary) neoplasm, unspecified: C80.1

## 2018-01-02 HISTORY — PX: IR US GUIDE VASC ACCESS RIGHT: IMG2390

## 2018-01-02 LAB — CBC
HEMATOCRIT: 29.4 % — AB (ref 36.0–46.0)
Hemoglobin: 8.5 g/dL — ABNORMAL LOW (ref 12.0–15.0)
MCH: 16 pg — ABNORMAL LOW (ref 26.0–34.0)
MCHC: 28.9 g/dL — AB (ref 30.0–36.0)
MCV: 55.3 fL — AB (ref 78.0–100.0)
PLATELETS: 563 10*3/uL — AB (ref 150–400)
RBC: 5.32 MIL/uL — ABNORMAL HIGH (ref 3.87–5.11)
WBC: 5.2 10*3/uL (ref 4.0–10.5)

## 2018-01-02 LAB — BASIC METABOLIC PANEL
Anion gap: 7 (ref 5–15)
BUN: 7 mg/dL (ref 6–20)
CO2: 25 mmol/L (ref 22–32)
CREATININE: 0.48 mg/dL (ref 0.44–1.00)
Calcium: 8.8 mg/dL — ABNORMAL LOW (ref 8.9–10.3)
Chloride: 105 mmol/L (ref 101–111)
GFR calc Af Amer: >60 mL/min (ref >60)
GLUCOSE: 102 mg/dL — AB (ref 65–99)
POTASSIUM: 3.9 mmol/L (ref 3.5–5.1)
SODIUM: 137 mmol/L (ref 135–145)

## 2018-01-02 LAB — PROTIME-INR
INR: 1.02
Prothrombin Time: 13.3 seconds (ref 11.4–15.2)

## 2018-01-02 MED ORDER — FENTANYL CITRATE (PF) 100 MCG/2ML IJ SOLN
INTRAMUSCULAR | Status: AC
  Filled 2018-01-02: qty 2

## 2018-01-02 MED ORDER — MIDAZOLAM HCL 2 MG/2ML IJ SOLN
INTRAMUSCULAR | Status: AC
  Filled 2018-01-02: qty 2

## 2018-01-02 MED ORDER — CEFAZOLIN SODIUM-DEXTROSE 2-4 GM/100ML-% IV SOLN
2.0000 g | INTRAVENOUS | Status: AC
  Administered 2018-01-02: 2 g via INTRAVENOUS

## 2018-01-02 MED ORDER — HEPARIN SOD (PORK) LOCK FLUSH 100 UNIT/ML IV SOLN
INTRAVENOUS | Status: AC
  Filled 2018-01-02: qty 5

## 2018-01-02 MED ORDER — CEFAZOLIN SODIUM-DEXTROSE 2-4 GM/100ML-% IV SOLN
INTRAVENOUS | Status: AC
  Filled 2018-01-02: qty 100

## 2018-01-02 MED ORDER — SODIUM CHLORIDE 0.9 % IV SOLN: INTRAVENOUS | Status: DC

## 2018-01-02 MED ORDER — LIDOCAINE-EPINEPHRINE (PF) 1 %-1:200000 IJ SOLN
INTRAMUSCULAR | Status: AC
  Filled 2018-01-02: qty 30

## 2018-01-02 MED ORDER — FENTANYL CITRATE (PF) 100 MCG/2ML IJ SOLN
INTRAMUSCULAR | Status: AC | PRN
  Administered 2018-01-02: 25 ug via INTRAVENOUS
  Administered 2018-01-02: 50 ug via INTRAVENOUS

## 2018-01-02 MED ORDER — MIDAZOLAM HCL 2 MG/2ML IJ SOLN
INTRAMUSCULAR | Status: AC | PRN
  Administered 2018-01-02: 0.5 mg via INTRAVENOUS
  Administered 2018-01-02: 1 mg via INTRAVENOUS

## 2018-01-02 NOTE — Discharge Instructions (Signed)
Implanted Port Insertion, Care After °This sheet gives you information about how to care for yourself after your procedure. Your health care provider may also give you more specific instructions. If you have problems or questions, contact your health care provider. °What can I expect after the procedure? °After your procedure, it is common to have: °· Discomfort at the port insertion site. °· Bruising on the skin over the port. This should improve over 3-4 days. ° °Follow these instructions at home: °Port care °· After your port is placed, you will get a manufacturer's information card. The card has information about your port. Keep this card with you at all times. °· Take care of the port as told by your health care provider. Ask your health care provider if you or a family member can get training for taking care of the port at home. A home health care nurse may also take care of the port. °· Make sure to remember what type of port you have. °Incision care °· Follow instructions from your health care provider about how to take care of your port insertion site. Make sure you: °? Wash your hands with soap and water before you change your bandage (dressing). If soap and water are not available, use hand sanitizer. °? Change your dressing as told by your health care provider. °? Leave stitches (sutures), skin glue, or adhesive strips in place. These skin closures may need to stay in place for 2 weeks or longer. If adhesive strip edges start to loosen and curl up, you may trim the loose edges. Do not remove adhesive strips completely unless your health care provider tells you to do that. °· Check your port insertion site every day for signs of infection. Check for: °? More redness, swelling, or pain. °? More fluid or blood. °? Warmth. °? Pus or a bad smell. °General instructions °· Do not take baths, swim, or use a hot tub until your health care provider approves. °· Do not lift anything that is heavier than 10 lb (4.5  kg) for a week, or as told by your health care provider. °· Ask your health care provider when it is okay to: °? Return to work or school. °? Resume usual physical activities or sports. °· Do not drive for 24 hours if you were given a medicine to help you relax (sedative). °· Take over-the-counter and prescription medicines only as told by your health care provider. °· Wear a medical alert bracelet in case of an emergency. This will tell any health care providers that you have a port. °· Keep all follow-up visits as told by your health care provider. This is important. °Contact a health care provider if: °· You cannot flush your port with saline as directed, or you cannot draw blood from the port. °· You have a fever or chills. °· You have more redness, swelling, or pain around your port insertion site. °· You have more fluid or blood coming from your port insertion site. °· Your port insertion site feels warm to the touch. °· You have pus or a bad smell coming from the port insertion site. °Get help right away if: °· You have chest pain or shortness of breath. °· You have bleeding from your port that you cannot control. °Summary °· Take care of the port as told by your health care provider. °· Change your dressing as told by your health care provider. °· Keep all follow-up visits as told by your health care provider. °  This information is not intended to replace advice given to you by your health care provider. Make sure you discuss any questions you have with your health care provider. °Document Released: 06/05/2013 Document Revised: 07/06/2016 Document Reviewed: 07/06/2016 °Elsevier Interactive Patient Education © 2017 [employer] ° °

## 2018-01-02 NOTE — Procedures (Signed)
Colon ca  S/p RT IJ POWER PORT  TIP SVCRA NO COMP STABLE EBL MIN READY FOR USE FULL REPORT IN PACS

## 2018-01-02 NOTE — Progress Notes (Unsigned)
The patient is enrolled for drug assistance by [employer] for 4 syringes of Udenyca. There are no current scheduled injection appointments.

## 2018-01-02 NOTE — H&P (Signed)
Chief Complaint: Patient was seen in consultation today for [facility] a cath placement at the request of Connor,Chelsea A  Referring Physician(s): Connor,Chelsea A Dr Darrold Junker  Supervising Physician: Daryll Brod  Patient Status: [facility] - Out-pt  History of Present Illness: Elizabeth Saunders is a 62 y.o. female   Dx colorectal cancer For treatment to start May 20 [facility] placement today  Pt accompanied by [place] speaking daughter in law  Past Medical History:  Diagnosis Date  . Anemia 12/16/2017  . Cancer [facility])    colorectal cancer 2019    Past Surgical History:  Procedure Laterality Date  . COLON RESECTION N/A 12/18/2017   Procedure: COLON RESECTION;  Surgeon: Clovis Riley, MD;  Location: [place];  Service: General;  Laterality: N/A;  . COLOSTOMY N/A 12/18/2017   Procedure: COLOSTOMY;  Surgeon: Clovis Riley, MD;  Location: [place];  Service: General;  Laterality: N/A;  . FLEXIBLE SIGMOIDOSCOPY N/A 12/17/2017   Procedure: FLEXIBLE SIGMOIDOSCOPY;  Surgeon: Yetta Flock, MD;  Location: [place];  Service: Gastroenterology;  Laterality: N/A;    Allergies: Patient has no known allergies.  Medications: Prior to Admission medications   Medication Sig Start Date End Date Taking? Authorizing Provider  acetaminophen (TYLENOL) 325 MG tablet Take 2 tablets (650 mg total) by mouth every 6 (six) hours as needed for mild pain or headache. 12/22/17   Kathi Ludwig, MD  ferrous sulfate 325 (65 FE) MG tablet Take 1 tablet (325 mg total) by mouth daily with breakfast. 12/22/17   Kathi Ludwig, MD  lidocaine-prilocaine (EMLA) cream Use on Port prior to coming to treatment.  Apply 1-2 hours before 12/28/17   Ladell Pier, MD  ondansetron (ZOFRAN ODT) 4 MG disintegrating tablet Take 1 tablet (4 mg total) by mouth every 8 (eight) hours as needed for nausea. Patient not taking: Reported on 12/16/2017 02/05/16   Larene Pickett, PA-C  oxyCODONE (OXY IR/ROXICODONE) 5 MG immediate  release tablet Take 1 tablet (5 mg total) by mouth every 4 (four) hours as needed for severe pain. 12/22/17   Kathi Ludwig, MD     History reviewed. No pertinent family history.  Social History   Socioeconomic History  . Marital status: Single    Spouse name: Not on file  . Number of children: Not on file  . Years of education: Not on file  . Highest education level: Not on file  Occupational History  . Not on file  Social Needs  . Financial resource strain: Not on file  . Food insecurity:    Worry: Not on file    Inability: Not on file  . Transportation needs:    Medical: Not on file    Non-medical: Not on file  Tobacco Use  . Smoking status: Never Smoker  . Smokeless tobacco: Never Used  Substance and Sexual Activity  . Alcohol use: No    Alcohol/week: 0.0 oz  . Drug use: No  . Sexual activity: Not on file  Lifestyle  . Physical activity:    Days per week: Not on file    Minutes per session: Not on file  . Stress: Not on file  Relationships  . Social connections:    Talks on phone: Not on file    Gets together: Not on file    Attends religious service: Not on file    Active member of club or organization: Not on file    Attends meetings of clubs or organizations: Not on file  Relationship status: Not on file  Other Topics Concern  . Not on file  Social History Narrative  . Not on file      Review of Systems: A 12 point ROS discussed and pertinent positives are indicated in the HPI above.  All other systems are negative.  Review of Systems  Constitutional: Positive for activity change and appetite change. Negative for fatigue and fever.  Respiratory: Negative for cough and shortness of breath.   Cardiovascular: Negative for chest pain.  Gastrointestinal: Positive for constipation. Negative for abdominal pain.  Neurological: Positive for weakness.  Psychiatric/Behavioral: Negative for behavioral problems and confusion.    Vital Signs: BP 134/77  (BP Location: Right Arm)   Pulse 67   Temp 98 F (36.7 C) (Oral)   Ht 5\' 4"  (1.626 m)   Wt 93 lb 12.8 oz (42.5 kg)   SpO2 100%   BMI 16.10 kg/m   Physical Exam  Constitutional: She is oriented to person, place, and time.  Cardiovascular: Normal rate and regular rhythm.  Murmur heard. Pulmonary/Chest: Effort normal and breath sounds normal.  Abdominal: Soft. Bowel sounds are normal.  Musculoskeletal: Normal range of motion.  Neurological: She is alert and oriented to person, place, and time.  Skin: Skin is warm and dry.  Psychiatric: She has a normal mood and affect. Her behavior is normal. Judgment and thought content normal.  Does NOT speak English Accompanied by [place] speaking daughter in law  Nursing note and vitals reviewed.   Imaging: Ct Chest W Contrast  Result Date: 12/18/2017 CLINICAL DATA:  Patient with recent colonic mass. Assess for metastatic disease. EXAM: CT CHEST WITH CONTRAST TECHNIQUE: Multidetector CT imaging of the chest was performed during intravenous contrast administration. CONTRAST:  62mL ISOVUE-300 IOPAMIDOL (ISOVUE-300) INJECTION 61% COMPARISON:  CT abdomen pelvis 12/16/2017. FINDINGS: Cardiovascular: Heart is mildly enlarged. No pericardial effusion. Thoracic aortic vascular calcifications. Mediastinum/Nodes: No enlarged axillary, mediastinal or hilar lymphadenopathy. Normal esophagus. Lungs/Pleura: Central airways are patent. Dependent atelectasis within the bilateral lower lobes. There is a 5 mm right upper lobe nodule (image 32; series 6). No pleural effusion or pneumothorax. Similar-appearing pleural-based calcification within the right lower hemithorax, suggestive of benign etiology given stability over time and appearance. Similar-appearing bandlike opacity right lower hemithorax (image 110; series 6), favored to represent scarring. Additional smaller pleural based calcification mid right hemithorax (image 70; series 6). Bilateral small pleural based  calcifications. Upper Abdomen: No acute process. Subcentimeter too small to characterize low-attenuation lesion interpolar region right kidney (image 144; series 4). Musculoskeletal: No aggressive or acute appearing osseous lesions. IMPRESSION: 1. There is a 5 mm right upper lobe nodule, indeterminate. Recommend attention on follow-up. 2. Bilateral pleural based calcifications, suggestive of prior asbestos exposure. 3. Aortic Atherosclerosis (ICD10-I70.0). Electronically Signed   By: Lovey Newcomer M.D.   On: 12/18/2017 10:12   Ct Abdomen Pelvis W Contrast  Result Date: 12/16/2017 CLINICAL DATA:  Pt c/o LLQ pain feels like a large knot that is cramping and contracting pt says her tongue is also numb and rt foot EXAM: CT ABDOMEN AND PELVIS WITH CONTRAST TECHNIQUE: Multidetector CT imaging of the abdomen and pelvis was performed using the standard protocol following bolus administration of intravenous contrast. CONTRAST:  165mL ISOVUE-300 IOPAMIDOL (ISOVUE-300) INJECTION 61% COMPARISON:  02/05/2016 and previous FINDINGS: Lower chest: Partially calcified right pleural plaque. Hepatobiliary: No focal liver abnormality is seen. No gallstones, gallbladder wall thickening, or biliary dilatation. Pancreas: Unremarkable. No pancreatic ductal dilatation or surrounding inflammatory changes. Spleen: Normal in  size without focal abnormality. Adrenals/Urinary Tract: Normal adrenals. Stable subcentimeter probable cyst in the mid right kidney. No hydronephrosis or nephrolithiasis. Urinary bladder nondistended. Stomach/Bowel: Stomach is nondistended. Small bowel decompressed. Appendix not discretely identified. Large amount of fecal material and gas distending the colon. There is mild circumferential wall thickening in the splenic flexure, descending and proximal sigmoid segments without significant adjacent inflammatory/edematous change or abscess. Metallic intraluminal linear densities near the rectosigmoid junction, a site of  relative narrowing. The rectum is decompressed. Vascular/Lymphatic: Mild Aortic Atherosclerosis (ICD10-170.0) without aneurysm. No abdominal or pelvic adenopathy. Portal vein patent. Reproductive: Uterus and bilateral adnexa are unremarkable. Other: No ascites.  No free air. Musculoskeletal: No acute or significant osseous findings. IMPRESSION: 1. 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 There are associated metallic linear density intraluminally at this level, which were not present on prior study of 02/05/2016, suggesting interval surgical or colonoscopic intervention versus foreign body. 2. Wall thickening in the descending and sigmoid colon suggesting colitis, possible stercoral given the apparent rectosigmoid stricture. Electronically Signed   By: Lucrezia Europe M.D.   On: 12/16/2017 11:27    Labs:  CBC: Recent Labs    12/20/17 0803 12/21/17 0501 12/22/17 0555 12/29/17 1646  WBC 9.2 9.6 7.6 9.3  HGB 8.8* 8.6* 8.3* 9.2*  HCT 29.8* 28.9* 28.3* 32.9*  PLT 298 271 326 746*    COAGS: No results for input(s): INR, APTT in the last 8760 hours.  BMP: Recent Labs    12/19/17 0422 12/20/17 0803 12/21/17 0501 12/22/17 0555  NA 134* 135 134* 136  K 3.8 3.8 3.8 3.9  CL 109 106 106 104  CO2 18* 23 21* 24  GLUCOSE 180* 137* 124* 111*  BUN <5* <5* <5* <5*  CALCIUM 8.3* 8.4* 8.4* 8.4*  CREATININE 0.57 0.49 0.47 0.49  GFRNONAA >60 >60 >60 >60  GFRAA >60 >60 >60 >60    LIVER FUNCTION TESTS: Recent Labs    12/16/17 0755  BILITOT 0.5  AST 17  ALT 9*  ALKPHOS 94  PROT 7.9  ALBUMIN 3.5    TUMOR MARKERS: No results for input(s): AFPTM, CEA, CA199, CHROMGRNA in the last 8760 hours.  Assessment and Plan:  New Dx Colorectal Ca Scheduled for PAC placement today Risks and benefits of image guided port-a-catheter placement was discussed with the patient and family including, but not limited to bleeding, infection, pneumothorax, or fibrin sheath development and  need for additional procedures.  All of the patient's questions were answered, patient is agreeable to proceed. Consent signed and in chart.    Thank you for this interesting consult.  I greatly enjoyed meeting Elizabeth Saunders and look forward to participating in their care.  A copy of this report was sent to the requesting provider on this date.  Electronically Signed: Lavonia Drafts, PA-C 01/02/2018, 9:29 AM   I spent a total of  30 Minutes   in face to face in clinical consultation, greater than 50% of which was counseling/coordinating care for [facility] placement

## 2018-01-02 NOTE — Sedation Documentation (Signed)
Bed rest x1 hour starts now.

## 2018-01-02 NOTE — Sedation Documentation (Signed)
Verbal order obtained to start procedure without results from CBC. MD reviewed results from CBC completed on 12/29/2017.

## 2018-01-03 ENCOUNTER — Inpatient Hospital Stay: Payer: Self-pay | Admitting: Internal Medicine

## 2018-01-04 ENCOUNTER — Ambulatory Visit: Payer: Self-pay | Admitting: Oncology

## 2018-01-14 ENCOUNTER — Other Ambulatory Visit: Payer: Self-pay | Admitting: Oncology

## 2018-01-15 ENCOUNTER — Inpatient Hospital Stay: Payer: Medicaid Other

## 2018-01-15 ENCOUNTER — Telehealth: Payer: Self-pay

## 2018-01-15 ENCOUNTER — Inpatient Hospital Stay (HOSPITAL_BASED_OUTPATIENT_CLINIC_OR_DEPARTMENT_OTHER): Payer: Medicaid Other | Admitting: Oncology

## 2018-01-15 VITALS — BP 140/80 | HR 77 | Temp 98.3°F | Resp 18 | Ht 64.0 in | Wt 98.9 lb

## 2018-01-15 DIAGNOSIS — C189 Malignant neoplasm of colon, unspecified: Secondary | ICD-10-CM

## 2018-01-15 DIAGNOSIS — Z9049 Acquired absence of other specified parts of digestive tract: Secondary | ICD-10-CM | POA: Diagnosis not present

## 2018-01-15 DIAGNOSIS — Z933 Colostomy status: Secondary | ICD-10-CM

## 2018-01-15 DIAGNOSIS — Z95828 Presence of other vascular implants and grafts: Secondary | ICD-10-CM | POA: Insufficient documentation

## 2018-01-15 DIAGNOSIS — D649 Anemia, unspecified: Secondary | ICD-10-CM | POA: Diagnosis not present

## 2018-01-15 DIAGNOSIS — C19 Malignant neoplasm of rectosigmoid junction: Secondary | ICD-10-CM

## 2018-01-15 DIAGNOSIS — Z79899 Other long term (current) drug therapy: Secondary | ICD-10-CM

## 2018-01-15 DIAGNOSIS — Z5111 Encounter for antineoplastic chemotherapy: Secondary | ICD-10-CM | POA: Diagnosis present

## 2018-01-15 LAB — CMP (CANCER CENTER ONLY)
ALK PHOS: 75 U/L (ref 40–150)
ALT: 20 U/L (ref 0–55)
AST: 25 U/L (ref 5–34)
Albumin: 3.8 g/dL (ref 3.5–5.0)
Anion gap: 9 (ref 3–11)
BILIRUBIN TOTAL: 0.4 mg/dL (ref 0.2–1.2)
BUN: 12 mg/dL (ref 7–26)
CALCIUM: 9.2 mg/dL (ref 8.4–10.4)
CO2: 24 mmol/L (ref 22–29)
CREATININE: 0.65 mg/dL (ref 0.60–1.10)
Chloride: 108 mmol/L (ref 98–109)
GFR, Est AFR Am: >60 mL/min (ref >60)
GLUCOSE: 88 mg/dL (ref 70–140)
Potassium: 3.6 mmol/L (ref 3.5–5.1)
Sodium: 141 mmol/L (ref 136–145)
TOTAL PROTEIN: 7.9 g/dL (ref 6.4–8.3)

## 2018-01-15 LAB — CBC WITH DIFFERENTIAL (CANCER CENTER ONLY)
BASOS ABS: 0 10*3/uL (ref 0.0–0.1)
BASOS PCT: 1 %
EOS ABS: 0.4 10*3/uL (ref 0.0–0.5)
EOS PCT: 6 %
HCT: 31.4 % — ABNORMAL LOW (ref 34.8–46.6)
Hemoglobin: 9.3 g/dL — ABNORMAL LOW (ref 11.6–15.9)
Lymphocytes Relative: 30 %
Lymphs Abs: 1.9 10*3/uL (ref 0.9–3.3)
MCH: 17.2 pg — ABNORMAL LOW (ref 25.1–34.0)
MCHC: 29.6 g/dL — ABNORMAL LOW (ref 31.5–36.0)
MCV: 57.9 fL — ABNORMAL LOW (ref 79.5–101.0)
MONO ABS: 0.6 10*3/uL (ref 0.1–0.9)
Monocytes Relative: 9 %
Neutro Abs: 3.4 10*3/uL (ref 1.5–6.5)
Neutrophils Relative %: 54 %
PLATELETS: 199 10*3/uL (ref 145–400)
RBC: 5.42 MIL/uL (ref 3.70–5.45)
WBC: 6.3 10*3/uL (ref 3.9–10.3)

## 2018-01-15 LAB — CEA (IN HOUSE-CHCC): CEA (CHCC-In House): 1.45 ng/mL (ref 0.00–5.00)

## 2018-01-15 MED ORDER — SODIUM CHLORIDE 0.9 % IV SOLN
2400.0000 mg/m2 | INTRAVENOUS | Status: DC
  Administered 2018-01-15: 3400 mg via INTRAVENOUS
  Filled 2018-01-15: qty 68

## 2018-01-15 MED ORDER — SODIUM CHLORIDE 0.9 % IV SOLN: 10.0000 mg | Freq: Once | INTRAVENOUS | Status: DC

## 2018-01-15 MED ORDER — DEXAMETHASONE SODIUM PHOSPHATE 10 MG/ML IJ SOLN
INTRAMUSCULAR | Status: AC
  Filled 2018-01-15: qty 1

## 2018-01-15 MED ORDER — SODIUM CHLORIDE 0.9% FLUSH
10.0000 mL | Freq: Once | INTRAVENOUS | Status: AC
  Administered 2018-01-15: 10 mL
  Filled 2018-01-15: qty 10

## 2018-01-15 MED ORDER — FLUOROURACIL CHEMO INJECTION 2.5 GM/50ML
400.0000 mg/m2 | Freq: Once | INTRAVENOUS | Status: AC
  Administered 2018-01-15: 550 mg via INTRAVENOUS
  Filled 2018-01-15: qty 11

## 2018-01-15 MED ORDER — DEXAMETHASONE SODIUM PHOSPHATE 10 MG/ML IJ SOLN
10.0000 mg | Freq: Once | INTRAMUSCULAR | Status: AC
  Administered 2018-01-15: 10 mg via INTRAVENOUS

## 2018-01-15 MED ORDER — PALONOSETRON HCL INJECTION 0.25 MG/5ML
INTRAVENOUS | Status: AC
  Filled 2018-01-15: qty 5

## 2018-01-15 MED ORDER — PROCHLORPERAZINE MALEATE 10 MG PO TABS: 10.0000 mg | ORAL_TABLET | Freq: Four times a day (QID) | ORAL | 0 refills | Status: DC | PRN

## 2018-01-15 MED ORDER — PALONOSETRON HCL INJECTION 0.25 MG/5ML
0.2500 mg | Freq: Once | INTRAVENOUS | Status: AC
  Administered 2018-01-15: 0.25 mg via INTRAVENOUS

## 2018-01-15 MED ORDER — DEXTROSE 5 % IV SOLN
400.0000 mg/m2 | Freq: Once | INTRAVENOUS | Status: AC
  Administered 2018-01-15: 564 mg via INTRAVENOUS
  Filled 2018-01-15: qty 28.2

## 2018-01-15 MED ORDER — DEXTROSE 5 % IV SOLN
Freq: Once | INTRAVENOUS | Status: AC
  Administered 2018-01-15: 12:00:00 via INTRAVENOUS

## 2018-01-15 MED ORDER — OXALIPLATIN CHEMO INJECTION 100 MG/20ML
85.0000 mg/m2 | Freq: Once | INTRAVENOUS | Status: AC
  Administered 2018-01-15: 120 mg via INTRAVENOUS
  Filled 2018-01-15: qty 20
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
## 2018-01-15 NOTE — Telephone Encounter (Signed)
Printed avs and calender of upcoming appointment . Per 5/20 los 

## 2018-01-15 NOTE — Patient Instructions (Addendum)
[facility] Discharge Instructions for Patients Receiving Chemotherapy  Today you received the following chemotherapy agents: Oxaliplatin, Leucovorin, and 5FU (Fluorouracil).   To help prevent nausea and vomiting after your treatment, we encourage you to take your nausea medication as directed.   If you develop nausea and vomiting that is not controlled by your nausea medication, call the clinic.   BELOW ARE SYMPTOMS THAT SHOULD BE REPORTED IMMEDIATELY:  *FEVER GREATER THAN 100.5 F  *CHILLS WITH OR WITHOUT FEVER  NAUSEA AND VOMITING THAT IS NOT CONTROLLED WITH YOUR NAUSEA MEDICATION  *UNUSUAL SHORTNESS OF BREATH  *UNUSUAL BRUISING OR BLEEDING  TENDERNESS IN MOUTH AND THROAT WITH OR WITHOUT PRESENCE OF ULCERS  *URINARY PROBLEMS  *BOWEL PROBLEMS  UNUSUAL RASH Items with * indicate a potential emergency and should be followed up as soon as possible.  Feel free to call the clinic should you have any questions or concerns. The clinic phone number is [phone].  Please show the [facility] at check-in to the Emergency Department and triage nurse.  Oxaliplatin Injection What is this medicine? OXALIPLATIN (ox AL i PLA tin) is a chemotherapy drug. It targets fast dividing cells, like cancer cells, and causes these cells to die. This medicine is used to treat cancers of the colon and rectum, and many other cancers. This medicine may be used for other purposes; ask your health care provider or pharmacist if you have questions. COMMON BRAND NAME(S): Eloxatin What should I tell my health care provider before I take this medicine? They need to know if you have any of these conditions: -kidney disease -an unusual or allergic reaction to oxaliplatin, other chemotherapy, other medicines, foods, dyes, or preservatives -pregnant or trying to get pregnant -breast-feeding How should I use this medicine? This drug is given as an infusion into a vein. It is administered  in a hospital or clinic by a specially trained health care professional. Talk to your pediatrician regarding the use of this medicine in children. Special care may be needed. Overdosage: If you think you have taken too much of this medicine contact a poison control center or emergency room at once. NOTE: This medicine is only for you. Do not share this medicine with others. What if I miss a dose? It is important not to miss a dose. Call your doctor or health care professional if you are unable to keep an appointment. What may interact with this medicine? -medicines to increase blood counts like filgrastim, pegfilgrastim, sargramostim -probenecid -some antibiotics like amikacin, gentamicin, neomycin, polymyxin B, streptomycin, tobramycin -zalcitabine Talk to your doctor or health care professional before taking any of these medicines: -acetaminophen -aspirin -ibuprofen -ketoprofen -naproxen This list may not describe all possible interactions. Give your health care provider a list of all the medicines, herbs, non-prescription drugs, or dietary supplements you use. Also tell them if you smoke, drink alcohol, or use illegal drugs. Some items may interact with your medicine. What should I watch for while using this medicine? Your condition will be monitored carefully while you are receiving this medicine. You will need important blood work done while you are taking this medicine. This medicine can make you more sensitive to cold. Do not drink cold drinks or use ice. Cover exposed skin before coming in contact with cold temperatures or cold objects. When out in cold weather wear warm clothing and cover your mouth and nose to warm the air that goes into your lungs. Tell your doctor if you get sensitive to  the cold. This drug may make you feel generally unwell. This is not uncommon, as chemotherapy can affect healthy cells as well as cancer cells. Report any side effects. Continue your course of  treatment even though you feel ill unless your doctor tells you to stop. In some cases, you may be given additional medicines to help with side effects. Follow all directions for their use. Call your doctor or health care professional for advice if you get a fever, chills or sore throat, or other symptoms of a cold or flu. Do not treat yourself. This drug decreases your body's ability to fight infections. Try to avoid being around people who are sick. This medicine may increase your risk to bruise or bleed. Call your doctor or health care professional if you notice any unusual bleeding. Be careful brushing and flossing your teeth or using a toothpick because you may get an infection or bleed more easily. If you have any dental work done, tell your dentist you are receiving this medicine. Avoid taking products that contain aspirin, acetaminophen, ibuprofen, naproxen, or ketoprofen unless instructed by your doctor. These medicines may hide a fever. Do not become pregnant while taking this medicine. Women should inform their doctor if they wish to become pregnant or think they might be pregnant. There is a potential for serious side effects to an unborn child. Talk to your health care professional or pharmacist for more information. Do not breast-feed an infant while taking this medicine. Call your doctor or health care professional if you get diarrhea. Do not treat yourself. What side effects may I notice from receiving this medicine? Side effects that you should report to your doctor or health care professional as soon as possible: -allergic reactions like skin rash, itching or hives, swelling of the face, lips, or tongue -low blood counts - This drug may decrease the number of white blood cells, red blood cells and platelets. You may be at increased risk for infections and bleeding. -signs of infection - fever or chills, cough, sore throat, pain or difficulty passing urine -signs of decreased platelets  or bleeding - bruising, pinpoint red spots on the skin, black, tarry stools, nosebleeds -signs of decreased red blood cells - unusually weak or tired, fainting spells, lightheadedness -breathing problems -chest pain, pressure -cough -diarrhea -jaw tightness -mouth sores -nausea and vomiting -pain, swelling, redness or irritation at the injection site -pain, tingling, numbness in the hands or feet -problems with balance, talking, walking -redness, blistering, peeling or loosening of the skin, including inside the mouth -trouble passing urine or change in the amount of urine Side effects that usually do not require medical attention (report to your doctor or health care professional if they continue or are bothersome): -changes in vision -constipation -hair loss -loss of appetite -metallic taste in the mouth or changes in taste -stomach pain This list may not describe all possible side effects. Call your doctor for medical advice about side effects. You may report side effects to FDA at 1-800-FDA-1088. Where should I keep my medicine? This drug is given in a hospital or clinic and will not be stored at home. NOTE: This sheet is a summary. It may not cover all possible information. If you have questions about this medicine, talk to your doctor, pharmacist, or health care provider.  2018 Elsevier/Gold Standard (2008-03-11 17:22:47)   Leucovorin injection What is this medicine? LEUCOVORIN (loo koe VOR in) is used to prevent or treat the harmful effects of some medicines. This medicine is  used to treat anemia caused by a low amount of folic acid in the body. It is also used with 5-fluorouracil (5-FU) to treat colon cancer. This medicine may be used for other purposes; ask your health care provider or pharmacist if you have questions. What should I tell my health care provider before I take this medicine? They need to know if you have any of these conditions: -anemia from low levels of  vitamin B-12 in the blood -an unusual or allergic reaction to leucovorin, folic acid, other medicines, foods, dyes, or preservatives -pregnant or trying to get pregnant -breast-feeding How should I use this medicine? This medicine is for injection into a muscle or into a vein. It is given by a health care professional in a hospital or clinic setting. Talk to your pediatrician regarding the use of this medicine in children. Special care may be needed. Overdosage: If you think you have taken too much of this medicine contact a poison control center or emergency room at once. NOTE: This medicine is only for you. Do not share this medicine with others. What if I miss a dose? This does not apply. What may interact with this medicine? -capecitabine -fluorouracil -phenobarbital -phenytoin -primidone -trimethoprim-sulfamethoxazole This list may not describe all possible interactions. Give your health care provider a list of all the medicines, herbs, non-prescription drugs, or dietary supplements you use. Also tell them if you smoke, drink alcohol, or use illegal drugs. Some items may interact with your medicine. What should I watch for while using this medicine? Your condition will be monitored carefully while you are receiving this medicine. This medicine may increase the side effects of 5-fluorouracil, 5-FU. Tell your doctor or health care professional if you have diarrhea or mouth sores that do not get better or that get worse. What side effects may I notice from receiving this medicine? Side effects that you should report to your doctor or health care professional as soon as possible: -allergic reactions like skin rash, itching or hives, swelling of the face, lips, or tongue -breathing problems -fever, infection -mouth sores -unusual bleeding or bruising -unusually weak or tired Side effects that usually do not require medical attention (report to your doctor or health care professional if  they continue or are bothersome): -constipation or diarrhea -loss of appetite -nausea, vomiting This list may not describe all possible side effects. Call your doctor for medical advice about side effects. You may report side effects to FDA at 1-800-FDA-1088. Where should I keep my medicine? This drug is given in a hospital or clinic and will not be stored at home. NOTE: This sheet is a summary. It may not cover all possible information. If you have questions about this medicine, talk to your doctor, pharmacist, or health care provider.  2018 Elsevier/Gold Standard (2008-02-19 16:50:29)  Fluorouracil, 5-FU injection What is this medicine? FLUOROURACIL, 5-FU (flure oh YOOR a sil) is a chemotherapy drug. It slows the growth of cancer cells. This medicine is used to treat many types of cancer like breast cancer, colon or rectal cancer, pancreatic cancer, and stomach cancer. This medicine may be used for other purposes; ask your health care provider or pharmacist if you have questions. COMMON BRAND NAME(S): Adrucil What should I tell my health care provider before I take this medicine? They need to know if you have any of these conditions: -blood disorders -dihydropyrimidine dehydrogenase (DPD) deficiency -infection (especially a virus infection such as chickenpox, cold sores, or herpes) -kidney disease -liver disease -malnourished, poor  nutrition -recent or ongoing radiation therapy -an unusual or allergic reaction to fluorouracil, other chemotherapy, other medicines, foods, dyes, or preservatives -pregnant or trying to get pregnant -breast-feeding How should I use this medicine? This drug is given as an infusion or injection into a vein. It is administered in a hospital or clinic by a specially trained health care professional. Talk to your pediatrician regarding the use of this medicine in children. Special care may be needed. Overdosage: If you think you have taken too much of this  medicine contact a poison control center or emergency room at once. NOTE: This medicine is only for you. Do not share this medicine with others. What if I miss a dose? It is important not to miss your dose. Call your doctor or health care professional if you are unable to keep an appointment. What may interact with this medicine? -allopurinol -cimetidine -dapsone -digoxin -hydroxyurea -leucovorin -levamisole -medicines for seizures like ethotoin, fosphenytoin, phenytoin -medicines to increase blood counts like filgrastim, pegfilgrastim, sargramostim -medicines that treat or prevent blood clots like warfarin, enoxaparin, and dalteparin -methotrexate -metronidazole -pyrimethamine -some other chemotherapy drugs like busulfan, cisplatin, estramustine, vinblastine -trimethoprim -trimetrexate -vaccines Talk to your doctor or health care professional before taking any of these medicines: -acetaminophen -aspirin -ibuprofen -ketoprofen -naproxen This list may not describe all possible interactions. Give your health care provider a list of all the medicines, herbs, non-prescription drugs, or dietary supplements you use. Also tell them if you smoke, drink alcohol, or use illegal drugs. Some items may interact with your medicine. What should I watch for while using this medicine? Visit your doctor for checks on your progress. This drug may make you feel generally unwell. This is not uncommon, as chemotherapy can affect healthy cells as well as cancer cells. Report any side effects. Continue your course of treatment even though you feel ill unless your doctor tells you to stop. In some cases, you may be given additional medicines to help with side effects. Follow all directions for their use. Call your doctor or health care professional for advice if you get a fever, chills or sore throat, or other symptoms of a cold or flu. Do not treat yourself. This drug decreases your body's ability to fight  infections. Try to avoid being around people who are sick. This medicine may increase your risk to bruise or bleed. Call your doctor or health care professional if you notice any unusual bleeding. Be careful brushing and flossing your teeth or using a toothpick because you may get an infection or bleed more easily. If you have any dental work done, tell your dentist you are receiving this medicine. Avoid taking products that contain aspirin, acetaminophen, ibuprofen, naproxen, or ketoprofen unless instructed by your doctor. These medicines may hide a fever. Do not become pregnant while taking this medicine. Women should inform their doctor if they wish to become pregnant or think they might be pregnant. There is a potential for serious side effects to an unborn child. Talk to your health care professional or pharmacist for more information. Do not breast-feed an infant while taking this medicine. Men should inform their doctor if they wish to father a child. This medicine may lower sperm counts. Do not treat diarrhea with over the counter products. Contact your doctor if you have diarrhea that lasts more than 2 days or if it is severe and watery. This medicine can make you more sensitive to the sun. Keep out of the sun. If you cannot avoid being  in the sun, wear protective clothing and use sunscreen. Do not use sun lamps or tanning beds/booths. What side effects may I notice from receiving this medicine? Side effects that you should report to your doctor or health care professional as soon as possible: -allergic reactions like skin rash, itching or hives, swelling of the face, lips, or tongue -low blood counts - this medicine may decrease the number of white blood cells, red blood cells and platelets. You may be at increased risk for infections and bleeding. -signs of infection - fever or chills, cough, sore throat, pain or difficulty passing urine -signs of decreased platelets or bleeding - bruising,  pinpoint red spots on the skin, black, tarry stools, blood in the urine -signs of decreased red blood cells - unusually weak or tired, fainting spells, lightheadedness -breathing problems -changes in vision -chest pain -mouth sores -nausea and vomiting -pain, swelling, redness at site where injected -pain, tingling, numbness in the hands or feet -redness, swelling, or sores on hands or feet -stomach pain -unusual bleeding Side effects that usually do not require medical attention (report to your doctor or health care professional if they continue or are bothersome): -changes in finger or toe nails -diarrhea -dry or itchy skin -hair loss -headache -loss of appetite -sensitivity of eyes to the light -stomach upset -unusually teary eyes This list may not describe all possible side effects. Call your doctor for medical advice about side effects. You may report side effects to FDA at 1-800-FDA-1088. Where should I keep my medicine? This drug is given in a hospital or clinic and will not be stored at home. NOTE: This sheet is a summary. It may not cover all possible information. If you have questions about this medicine, talk to your doctor, pharmacist, or health care provider.  2018 Elsevier/Gold Standard (2007-12-19 13:53:16)

## 2018-01-15 NOTE — Progress Notes (Signed)
Met with patient and interpreter during f/u apt with Dr. Benay Spice. I also met with patient in the infusion room to review use of nausea medications. I also encouraged pt and her son to call the clinic with questions or concerns.

## 2018-01-16 NOTE — Progress Notes (Signed)
FMLA for son, Lequisha Cammack, successfully faxed to [place] at [phone]. Mailed copy to patient address on file per request.

## 2018-01-17 ENCOUNTER — Inpatient Hospital Stay: Payer: Medicaid Other

## 2018-01-17 VITALS — BP 128/71 | HR 70 | Temp 98.1°F | Resp 18

## 2018-01-17 DIAGNOSIS — Z5111 Encounter for antineoplastic chemotherapy: Secondary | ICD-10-CM | POA: Diagnosis not present

## 2018-01-17 DIAGNOSIS — C189 Malignant neoplasm of colon, unspecified: Secondary | ICD-10-CM

## 2018-01-17 MED ORDER — SODIUM CHLORIDE 0.9% FLUSH
10.0000 mL | INTRAVENOUS | Status: DC | PRN
  Administered 2018-01-17: 10 mL
  Filled 2018-01-17: qty 10

## 2018-01-17 MED ORDER — HEPARIN SOD (PORK) LOCK FLUSH 100 UNIT/ML IV SOLN
500.0000 [IU] | Freq: Once | INTRAVENOUS | Status: AC | PRN
  Administered 2018-01-17: 500 [IU]
  Filled 2018-01-17: qty 5

## 2018-01-18 LAB — HEMOGLOBINOPATHY EVALUATION
HGB C: 0 %
HGB VARIANT: 0.8 % — AB
Hgb A2 Quant: 1.8 % (ref 1.8–3.2)
Hgb A: 97.4 % (ref 96.4–98.8)
Hgb F Quant: 0 % (ref 0.0–2.0)
Hgb S Quant: 0 %

## 2018-01-22 ENCOUNTER — Other Ambulatory Visit: Payer: Self-pay | Admitting: Oncology

## 2018-01-29 ENCOUNTER — Inpatient Hospital Stay: Payer: Medicaid Other | Attending: Nurse Practitioner | Admitting: Nurse Practitioner

## 2018-01-29 ENCOUNTER — Inpatient Hospital Stay: Payer: Medicaid Other

## 2018-01-29 ENCOUNTER — Telehealth: Payer: Self-pay

## 2018-01-29 ENCOUNTER — Encounter: Payer: Self-pay | Admitting: Nurse Practitioner

## 2018-01-29 VITALS — BP 137/89 | HR 78 | Temp 98.4°F | Resp 17 | Ht 64.0 in | Wt 98.8 lb

## 2018-01-29 DIAGNOSIS — Z933 Colostomy status: Secondary | ICD-10-CM | POA: Diagnosis not present

## 2018-01-29 DIAGNOSIS — Z5111 Encounter for antineoplastic chemotherapy: Secondary | ICD-10-CM | POA: Diagnosis present

## 2018-01-29 DIAGNOSIS — C19 Malignant neoplasm of rectosigmoid junction: Secondary | ICD-10-CM

## 2018-01-29 DIAGNOSIS — C189 Malignant neoplasm of colon, unspecified: Secondary | ICD-10-CM

## 2018-01-29 DIAGNOSIS — Z95828 Presence of other vascular implants and grafts: Secondary | ICD-10-CM

## 2018-01-29 DIAGNOSIS — Z9049 Acquired absence of other specified parts of digestive tract: Secondary | ICD-10-CM | POA: Insufficient documentation

## 2018-01-29 DIAGNOSIS — D509 Iron deficiency anemia, unspecified: Secondary | ICD-10-CM | POA: Diagnosis not present

## 2018-01-29 LAB — CBC WITH DIFFERENTIAL (CANCER CENTER ONLY)
Basophils Absolute: 0 10*3/uL (ref 0.0–0.1)
Basophils Relative: 1 %
Eosinophils Absolute: 0.3 10*3/uL (ref 0.0–0.5)
Eosinophils Relative: 6 %
HEMATOCRIT: 32.5 % — AB (ref 34.8–46.6)
Hemoglobin: 9.4 g/dL — ABNORMAL LOW (ref 11.6–15.9)
LYMPHS ABS: 1.5 10*3/uL (ref 0.9–3.3)
Lymphocytes Relative: 31 %
MCH: 16.8 pg — AB (ref 25.1–34.0)
MCHC: 28.9 g/dL — ABNORMAL LOW (ref 31.5–36.0)
MCV: 58.1 fL — AB (ref 79.5–101.0)
MONO ABS: 0.6 10*3/uL (ref 0.1–0.9)
Monocytes Relative: 12 %
NEUTROS ABS: 2.5 10*3/uL (ref 1.5–6.5)
Neutrophils Relative %: 50 %
Platelet Count: 198 10*3/uL (ref 145–400)
RBC: 5.6 MIL/uL — ABNORMAL HIGH (ref 3.70–5.45)
RDW: 38.8 % — AB (ref 11.2–14.5)
WBC Count: 4.9 10*3/uL (ref 3.9–10.3)

## 2018-01-29 LAB — CMP (CANCER CENTER ONLY)
ALBUMIN: 4 g/dL (ref 3.5–5.0)
ALT: 19 U/L (ref 0–55)
AST: 25 U/L (ref 5–34)
Alkaline Phosphatase: 86 U/L (ref 40–150)
Anion gap: 8 (ref 3–11)
BUN: 11 mg/dL (ref 7–26)
CO2: 26 mmol/L (ref 22–29)
Calcium: 9 mg/dL (ref 8.4–10.4)
Chloride: 106 mmol/L (ref 98–109)
Creatinine: 0.64 mg/dL (ref 0.60–1.10)
GFR, Est AFR Am: >60 mL/min (ref >60)
GFR, Estimated: >60 mL/min (ref >60)
GLUCOSE: 81 mg/dL (ref 70–140)
POTASSIUM: 3.9 mmol/L (ref 3.5–5.1)
Sodium: 140 mmol/L (ref 136–145)
Total Bilirubin: 0.7 mg/dL (ref 0.2–1.2)
Total Protein: 7.8 g/dL (ref 6.4–8.3)

## 2018-01-29 MED ORDER — DEXAMETHASONE SODIUM PHOSPHATE 10 MG/ML IJ SOLN
INTRAMUSCULAR | Status: AC
  Filled 2018-01-29: qty 1

## 2018-01-29 MED ORDER — FLUOROURACIL CHEMO INJECTION 2.5 GM/50ML
400.0000 mg/m2 | Freq: Once | INTRAVENOUS | Status: AC
  Administered 2018-01-29: 550 mg via INTRAVENOUS
  Filled 2018-01-29: qty 11

## 2018-01-29 MED ORDER — DEXAMETHASONE SODIUM PHOSPHATE 10 MG/ML IJ SOLN
10.0000 mg | Freq: Once | INTRAMUSCULAR | Status: AC
  Administered 2018-01-29: 10 mg via INTRAVENOUS

## 2018-01-29 MED ORDER — OXALIPLATIN CHEMO INJECTION 100 MG/20ML
85.0000 mg/m2 | Freq: Once | INTRAVENOUS | Status: AC
  Administered 2018-01-29: 120 mg via INTRAVENOUS
  Filled 2018-01-29: qty 4

## 2018-01-29 MED ORDER — PALONOSETRON HCL INJECTION 0.25 MG/5ML
INTRAVENOUS | Status: AC
  Filled 2018-01-29: qty 5

## 2018-01-29 MED ORDER — PALONOSETRON HCL INJECTION 0.25 MG/5ML
0.2500 mg | Freq: Once | INTRAVENOUS | Status: AC
  Administered 2018-01-29: 0.25 mg via INTRAVENOUS

## 2018-01-29 MED ORDER — LEUCOVORIN CALCIUM INJECTION 350 MG
400.0000 mg/m2 | Freq: Once | INTRAVENOUS | Status: AC
  Administered 2018-01-29: 564 mg via INTRAVENOUS
  Filled 2018-01-29: qty 28.2

## 2018-01-29 MED ORDER — DEXTROSE 5 % IV SOLN
Freq: Once | INTRAVENOUS | Status: AC
  Administered 2018-01-29: 13:00:00 via INTRAVENOUS

## 2018-01-29 MED ORDER — SODIUM CHLORIDE 0.9 % IV SOLN
2400.0000 mg/m2 | INTRAVENOUS | Status: DC
  Administered 2018-01-29: 3400 mg via INTRAVENOUS
  Filled 2018-01-29: qty 68

## 2018-01-29 MED ORDER — SODIUM CHLORIDE 0.9% FLUSH
10.0000 mL | Freq: Once | INTRAVENOUS | Status: AC
  Administered 2018-01-29: 10 mL
  Filled 2018-01-29: qty 10

## 2018-01-29 NOTE — Telephone Encounter (Signed)
Printed avs and calender of upcoming appointment. Per 6/3 los 

## 2018-01-29 NOTE — Patient Instructions (Signed)
[facility] Discharge Instructions for Patients Receiving Chemotherapy  Today you received the following chemotherapy agents: Oxaliplatin, Leucovorin, and 5FU (Fluorouracil).   To help prevent nausea and vomiting after your treatment, we encourage you to take your nausea medication as directed.   If you develop nausea and vomiting that is not controlled by your nausea medication, call the clinic.   BELOW ARE SYMPTOMS THAT SHOULD BE REPORTED IMMEDIATELY:  *FEVER GREATER THAN 100.5 F  *CHILLS WITH OR WITHOUT FEVER  NAUSEA AND VOMITING THAT IS NOT CONTROLLED WITH YOUR NAUSEA MEDICATION  *UNUSUAL SHORTNESS OF BREATH  *UNUSUAL BRUISING OR BLEEDING  TENDERNESS IN MOUTH AND THROAT WITH OR WITHOUT PRESENCE OF ULCERS  *URINARY PROBLEMS  *BOWEL PROBLEMS  UNUSUAL RASH Items with * indicate a potential emergency and should be followed up as soon as possible.  Feel free to call the clinic should you have any questions or concerns. The clinic phone number is [phone].  Please show the [facility] at check-in to the Emergency Department and triage nurse.
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
## 2018-01-31 ENCOUNTER — Inpatient Hospital Stay: Payer: Medicaid Other

## 2018-01-31 VITALS — BP 128/72 | HR 72 | Temp 98.6°F | Resp 17

## 2018-01-31 DIAGNOSIS — C189 Malignant neoplasm of colon, unspecified: Secondary | ICD-10-CM

## 2018-01-31 DIAGNOSIS — Z5111 Encounter for antineoplastic chemotherapy: Secondary | ICD-10-CM | POA: Diagnosis not present

## 2018-01-31 MED ORDER — SODIUM CHLORIDE 0.9% FLUSH
10.0000 mL | INTRAVENOUS | Status: DC | PRN
  Administered 2018-01-31: 10 mL
  Filled 2018-01-31: qty 10

## 2018-01-31 MED ORDER — HEPARIN SOD (PORK) LOCK FLUSH 100 UNIT/ML IV SOLN
500.0000 [IU] | Freq: Once | INTRAVENOUS | Status: AC | PRN
  Administered 2018-01-31: 500 [IU]
  Filled 2018-01-31: qty 5

## 2018-02-10 ENCOUNTER — Other Ambulatory Visit: Payer: Self-pay | Admitting: Oncology

## 2018-02-12 ENCOUNTER — Telehealth: Payer: Self-pay | Admitting: Oncology

## 2018-02-12 ENCOUNTER — Inpatient Hospital Stay (HOSPITAL_BASED_OUTPATIENT_CLINIC_OR_DEPARTMENT_OTHER): Payer: Medicaid Other | Admitting: Oncology

## 2018-02-12 ENCOUNTER — Inpatient Hospital Stay: Payer: Medicaid Other

## 2018-02-12 VITALS — BP 142/73 | HR 69 | Temp 98.6°F | Resp 18 | Ht 64.0 in | Wt 101.7 lb

## 2018-02-12 DIAGNOSIS — C19 Malignant neoplasm of rectosigmoid junction: Secondary | ICD-10-CM

## 2018-02-12 DIAGNOSIS — Z5111 Encounter for antineoplastic chemotherapy: Secondary | ICD-10-CM | POA: Diagnosis not present

## 2018-02-12 DIAGNOSIS — Z9049 Acquired absence of other specified parts of digestive tract: Secondary | ICD-10-CM | POA: Diagnosis not present

## 2018-02-12 DIAGNOSIS — D509 Iron deficiency anemia, unspecified: Secondary | ICD-10-CM | POA: Diagnosis not present

## 2018-02-12 DIAGNOSIS — C189 Malignant neoplasm of colon, unspecified: Secondary | ICD-10-CM

## 2018-02-12 DIAGNOSIS — Z95828 Presence of other vascular implants and grafts: Secondary | ICD-10-CM

## 2018-02-12 DIAGNOSIS — Z933 Colostomy status: Secondary | ICD-10-CM

## 2018-02-12 LAB — CBC WITH DIFFERENTIAL (CANCER CENTER ONLY)
BASOS ABS: 0 10*3/uL (ref 0.0–0.1)
Basophils Relative: 0 %
Eosinophils Absolute: 0.2 10*3/uL (ref 0.0–0.5)
Eosinophils Relative: 5 %
HCT: 31.6 % — ABNORMAL LOW (ref 34.8–46.6)
Hemoglobin: 9.5 g/dL — ABNORMAL LOW (ref 11.6–15.9)
LYMPHS ABS: 1.3 10*3/uL (ref 0.9–3.3)
LYMPHS PCT: 29 %
MCH: 19 pg — AB (ref 25.1–34.0)
MCHC: 30.1 g/dL — ABNORMAL LOW (ref 31.5–36.0)
MCV: 63.2 fL — AB (ref 79.5–101.0)
MONO ABS: 0.6 10*3/uL (ref 0.1–0.9)
Monocytes Relative: 12 %
Neutro Abs: 2.4 10*3/uL (ref 1.5–6.5)
Neutrophils Relative %: 54 %
PLATELETS: 134 10*3/uL — AB (ref 145–400)
RBC: 5 MIL/uL (ref 3.70–5.45)
WBC Count: 4.5 10*3/uL (ref 3.9–10.3)

## 2018-02-12 LAB — CMP (CANCER CENTER ONLY)
ALT: 41 U/L (ref 0–55)
AST: 50 U/L — AB (ref 5–34)
Albumin: 3.9 g/dL (ref 3.5–5.0)
Alkaline Phosphatase: 82 U/L (ref 40–150)
Anion gap: 8 (ref 3–11)
BILIRUBIN TOTAL: 0.6 mg/dL (ref 0.2–1.2)
BUN: 14 mg/dL (ref 7–26)
CO2: 24 mmol/L (ref 22–29)
Calcium: 9.2 mg/dL (ref 8.4–10.4)
Chloride: 108 mmol/L (ref 98–109)
Creatinine: 0.68 mg/dL (ref 0.60–1.10)
GFR, Est AFR Am: >60 mL/min (ref >60)
GFR, Estimated: >60 mL/min (ref >60)
Glucose, Bld: 103 mg/dL (ref 70–140)
POTASSIUM: 3.5 mmol/L (ref 3.5–5.1)
Sodium: 140 mmol/L (ref 136–145)
TOTAL PROTEIN: 7.5 g/dL (ref 6.4–8.3)

## 2018-02-12 MED ORDER — LEUCOVORIN CALCIUM INJECTION 350 MG
400.0000 mg/m2 | Freq: Once | INTRAVENOUS | Status: AC
  Administered 2018-02-12: 564 mg via INTRAVENOUS
  Filled 2018-02-12: qty 28.2

## 2018-02-12 MED ORDER — FLUOROURACIL CHEMO INJECTION 5 GM/100ML
2400.0000 mg/m2 | INTRAVENOUS | Status: DC
  Administered 2018-02-12: 3400 mg via INTRAVENOUS
  Filled 2018-02-12: qty 68

## 2018-02-12 MED ORDER — PALONOSETRON HCL INJECTION 0.25 MG/5ML
INTRAVENOUS | Status: AC
  Filled 2018-02-12: qty 5

## 2018-02-12 MED ORDER — SODIUM CHLORIDE 0.9% FLUSH
10.0000 mL | Freq: Once | INTRAVENOUS | Status: AC
  Administered 2018-02-12: 10 mL
  Filled 2018-02-12: qty 10

## 2018-02-12 MED ORDER — DEXTROSE 5 % IV SOLN
Freq: Once | INTRAVENOUS | Status: AC
  Administered 2018-02-12: 12:00:00 via INTRAVENOUS

## 2018-02-12 MED ORDER — DEXAMETHASONE SODIUM PHOSPHATE 10 MG/ML IJ SOLN
10.0000 mg | Freq: Once | INTRAMUSCULAR | Status: AC
  Administered 2018-02-12: 10 mg via INTRAVENOUS

## 2018-02-12 MED ORDER — DEXAMETHASONE SODIUM PHOSPHATE 10 MG/ML IJ SOLN
INTRAMUSCULAR | Status: AC
  Filled 2018-02-12: qty 1

## 2018-02-12 MED ORDER — FLUOROURACIL CHEMO INJECTION 2.5 GM/50ML
400.0000 mg/m2 | Freq: Once | INTRAVENOUS | Status: AC
  Administered 2018-02-12: 550 mg via INTRAVENOUS
  Filled 2018-02-12: qty 11

## 2018-02-12 MED ORDER — PALONOSETRON HCL INJECTION 0.25 MG/5ML
0.2500 mg | Freq: Once | INTRAVENOUS | Status: AC
  Administered 2018-02-12: 0.25 mg via INTRAVENOUS

## 2018-02-12 MED ORDER — OXALIPLATIN CHEMO INJECTION 100 MG/20ML
85.0000 mg/m2 | Freq: Once | INTRAVENOUS | Status: AC
  Administered 2018-02-12: 120 mg via INTRAVENOUS
  Filled 2018-02-12: qty 24

## 2018-02-12 NOTE — Patient Instructions (Signed)
[facility] Discharge Instructions for Patients Receiving Chemotherapy  Today you received the following chemotherapy agents: Oxaliplatin, Leucovorin, and 5FU.  To help prevent nausea and vomiting after your treatment, we encourage you to take your nausea medication as directed.   If you develop nausea and vomiting that is not controlled by your nausea medication, call the clinic.   BELOW ARE SYMPTOMS THAT SHOULD BE REPORTED IMMEDIATELY:  *FEVER GREATER THAN 100.5 F  *CHILLS WITH OR WITHOUT FEVER  NAUSEA AND VOMITING THAT IS NOT CONTROLLED WITH YOUR NAUSEA MEDICATION  *UNUSUAL SHORTNESS OF BREATH  *UNUSUAL BRUISING OR BLEEDING  TENDERNESS IN MOUTH AND THROAT WITH OR WITHOUT PRESENCE OF ULCERS  *URINARY PROBLEMS  *BOWEL PROBLEMS  UNUSUAL RASH Items with * indicate a potential emergency and should be followed up as soon as possible.  Feel free to call the clinic should you have any questions or concerns. The clinic phone number is [phone].  Please show the CHEMO ALERT CARD at check-in to the Emergency Department and triage nurse.    

## 2018-02-12 NOTE — Telephone Encounter (Signed)
Scheduled appt per 6/17 los - pt to get an updated schedule in treatment area.  
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
## 2018-02-14 ENCOUNTER — Inpatient Hospital Stay: Payer: Medicaid Other

## 2018-02-14 DIAGNOSIS — C189 Malignant neoplasm of colon, unspecified: Secondary | ICD-10-CM

## 2018-02-14 DIAGNOSIS — Z5111 Encounter for antineoplastic chemotherapy: Secondary | ICD-10-CM | POA: Diagnosis not present

## 2018-02-14 MED ORDER — HEPARIN SOD (PORK) LOCK FLUSH 100 UNIT/ML IV SOLN
500.0000 [IU] | Freq: Once | INTRAVENOUS | Status: AC | PRN
  Administered 2018-02-14: 500 [IU]
  Filled 2018-02-14: qty 5

## 2018-02-14 MED ORDER — SODIUM CHLORIDE 0.9% FLUSH
10.0000 mL | INTRAVENOUS | Status: DC | PRN
  Administered 2018-02-14: 10 mL
  Filled 2018-02-14: qty 10

## 2018-02-25 ENCOUNTER — Other Ambulatory Visit: Payer: Self-pay | Admitting: Oncology

## 2018-02-26 ENCOUNTER — Telehealth: Payer: Self-pay | Admitting: Nurse Practitioner

## 2018-02-26 ENCOUNTER — Inpatient Hospital Stay: Payer: Medicaid Other

## 2018-02-26 ENCOUNTER — Encounter: Payer: Self-pay | Admitting: Nurse Practitioner

## 2018-02-26 ENCOUNTER — Inpatient Hospital Stay: Payer: Medicaid Other | Attending: Nurse Practitioner | Admitting: Nurse Practitioner

## 2018-02-26 VITALS — BP 133/73 | HR 69 | Temp 98.2°F | Resp 17 | Ht 64.0 in | Wt 102.1 lb

## 2018-02-26 DIAGNOSIS — Z933 Colostomy status: Secondary | ICD-10-CM | POA: Diagnosis not present

## 2018-02-26 DIAGNOSIS — D509 Iron deficiency anemia, unspecified: Secondary | ICD-10-CM | POA: Insufficient documentation

## 2018-02-26 DIAGNOSIS — Z5111 Encounter for antineoplastic chemotherapy: Secondary | ICD-10-CM | POA: Diagnosis not present

## 2018-02-26 DIAGNOSIS — C189 Malignant neoplasm of colon, unspecified: Secondary | ICD-10-CM

## 2018-02-26 DIAGNOSIS — C19 Malignant neoplasm of rectosigmoid junction: Secondary | ICD-10-CM | POA: Diagnosis not present

## 2018-02-26 DIAGNOSIS — Z79899 Other long term (current) drug therapy: Secondary | ICD-10-CM | POA: Diagnosis not present

## 2018-02-26 DIAGNOSIS — Z9049 Acquired absence of other specified parts of digestive tract: Secondary | ICD-10-CM | POA: Insufficient documentation

## 2018-02-26 DIAGNOSIS — Z95828 Presence of other vascular implants and grafts: Secondary | ICD-10-CM

## 2018-02-26 LAB — CBC WITH DIFFERENTIAL (CANCER CENTER ONLY)
Basophils Absolute: 0 10*3/uL (ref 0.0–0.1)
Basophils Relative: 0 %
Eosinophils Absolute: 0.2 10*3/uL (ref 0.0–0.5)
Eosinophils Relative: 3 %
HCT: 32.5 % — ABNORMAL LOW (ref 34.8–46.6)
Hemoglobin: 9.7 g/dL — ABNORMAL LOW (ref 11.6–15.9)
LYMPHS ABS: 1.2 10*3/uL (ref 0.9–3.3)
LYMPHS PCT: 27 %
MCH: 20 pg — AB (ref 25.1–34.0)
MCHC: 29.8 g/dL — AB (ref 31.5–36.0)
MCV: 67 fL — AB (ref 79.5–101.0)
MONO ABS: 0.8 10*3/uL (ref 0.1–0.9)
MONOS PCT: 18 %
Neutro Abs: 2.3 10*3/uL (ref 1.5–6.5)
Neutrophils Relative %: 52 %
PLATELETS: 99 10*3/uL — AB (ref 145–400)
RBC: 4.85 MIL/uL (ref 3.70–5.45)
RDW: 27.2 % — AB (ref 11.2–14.5)
WBC Count: 4.5 10*3/uL (ref 3.9–10.3)

## 2018-02-26 LAB — CMP (CANCER CENTER ONLY)
ALBUMIN: 4 g/dL (ref 3.5–5.0)
ALT: 41 U/L (ref 0–44)
AST: 39 U/L (ref 15–41)
Alkaline Phosphatase: 93 U/L (ref 38–126)
Anion gap: 8 (ref 5–15)
BUN: 19 mg/dL (ref 6–20)
CHLORIDE: 107 mmol/L (ref 98–111)
CO2: 25 mmol/L (ref 22–32)
Calcium: 9.4 mg/dL (ref 8.9–10.3)
Creatinine: 0.66 mg/dL (ref 0.44–1.00)
GFR, Est AFR Am: >60 mL/min (ref >60)
GFR, Estimated: >60 mL/min (ref >60)
GLUCOSE: 114 mg/dL — AB (ref 70–99)
POTASSIUM: 3.7 mmol/L (ref 3.5–5.1)
Sodium: 140 mmol/L (ref 135–145)
Total Bilirubin: 0.8 mg/dL (ref 0.3–1.2)
Total Protein: 7.7 g/dL (ref 6.5–8.1)

## 2018-02-26 MED ORDER — SODIUM CHLORIDE 0.9% FLUSH
10.0000 mL | Freq: Once | INTRAVENOUS | Status: AC
  Administered 2018-02-26: 10 mL
  Filled 2018-02-26: qty 10

## 2018-02-26 MED ORDER — LEUCOVORIN CALCIUM INJECTION 350 MG
400.0000 mg/m2 | Freq: Once | INTRAVENOUS | Status: AC
  Administered 2018-02-26: 564 mg via INTRAVENOUS
  Filled 2018-02-26: qty 28.2

## 2018-02-26 MED ORDER — FLUOROURACIL CHEMO INJECTION 2.5 GM/50ML
400.0000 mg/m2 | Freq: Once | INTRAVENOUS | Status: AC
Start: 2018-02-26 — End: 2018-02-26
  Administered 2018-02-26: 550 mg via INTRAVENOUS
  Filled 2018-02-26: qty 11

## 2018-02-26 MED ORDER — DEXAMETHASONE SODIUM PHOSPHATE 10 MG/ML IJ SOLN
INTRAMUSCULAR | Status: AC
  Filled 2018-02-26: qty 1

## 2018-02-26 MED ORDER — SODIUM CHLORIDE 0.9 % IV SOLN
2400.0000 mg/m2 | INTRAVENOUS | Status: DC
  Administered 2018-02-26: 3400 mg via INTRAVENOUS
  Filled 2018-02-26: qty 68

## 2018-02-26 MED ORDER — DEXAMETHASONE SODIUM PHOSPHATE 10 MG/ML IJ SOLN
10.0000 mg | Freq: Once | INTRAMUSCULAR | Status: AC
  Administered 2018-02-26: 10 mg via INTRAVENOUS

## 2018-02-26 MED ORDER — SODIUM CHLORIDE 0.9% FLUSH
10.0000 mL | INTRAVENOUS | Status: DC | PRN
Start: 2018-02-26 — End: 2018-02-26
  Filled 2018-02-26: qty 10

## 2018-02-26 MED ORDER — PALONOSETRON HCL INJECTION 0.25 MG/5ML
0.2500 mg | Freq: Once | INTRAVENOUS | Status: AC
  Administered 2018-02-26: 0.25 mg via INTRAVENOUS

## 2018-02-26 MED ORDER — HEPARIN SOD (PORK) LOCK FLUSH 100 UNIT/ML IV SOLN
500.0000 [IU] | Freq: Once | INTRAVENOUS | Status: DC | PRN
  Filled 2018-02-26: qty 5

## 2018-02-26 MED ORDER — PALONOSETRON HCL INJECTION 0.25 MG/5ML
INTRAVENOUS | Status: AC
  Filled 2018-02-26: qty 5

## 2018-02-26 MED ORDER — DEXTROSE 5 % IV SOLN
Freq: Once | INTRAVENOUS | Status: AC
  Administered 2018-02-26: 11:00:00 via INTRAVENOUS

## 2018-02-26 MED ORDER — OXALIPLATIN CHEMO INJECTION 100 MG/20ML
65.0000 mg/m2 | Freq: Once | INTRAVENOUS | Status: AC
  Administered 2018-02-26: 90 mg via INTRAVENOUS
  Filled 2018-02-26: qty 18
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
## 2018-02-26 NOTE — Progress Notes (Signed)
Okay to reduce dose for Oaxaliplatin per Ned Card.

## 2018-02-26 NOTE — Patient Instructions (Signed)
[facility] Discharge Instructions for Patients Receiving Chemotherapy  Today you received the following chemotherapy agents: Oaxlaiplatin, Leucovorin, 5FU  To help prevent nausea and vomiting after your treatment, we encourage you to take your nausea medication as directed.   If you develop nausea and vomiting that is not controlled by your nausea medication, call the clinic.   BELOW ARE SYMPTOMS THAT SHOULD BE REPORTED IMMEDIATELY:  *FEVER GREATER THAN 100.5 F  *CHILLS WITH OR WITHOUT FEVER  NAUSEA AND VOMITING THAT IS NOT CONTROLLED WITH YOUR NAUSEA MEDICATION  *UNUSUAL SHORTNESS OF BREATH  *UNUSUAL BRUISING OR BLEEDING  TENDERNESS IN MOUTH AND THROAT WITH OR WITHOUT PRESENCE OF ULCERS  *URINARY PROBLEMS  *BOWEL PROBLEMS  UNUSUAL RASH Items with * indicate a potential emergency and should be followed up as soon as possible.  Feel free to call the clinic should you have any questions or concerns. The clinic phone number is [phone].  Please show the [facility] at check-in to the Emergency Department and triage nurse.

## 2018-02-26 NOTE — Telephone Encounter (Signed)
Scheduled appt per 7/1 los - Pt to get an updated scheduled in the treatment area.

## 2018-02-27 DIAGNOSIS — Z0271 Encounter for disability determination: Secondary | ICD-10-CM

## 2018-02-28 ENCOUNTER — Inpatient Hospital Stay: Payer: Medicaid Other

## 2018-02-28 VITALS — BP 109/65 | HR 90 | Temp 98.0°F | Resp 20

## 2018-02-28 DIAGNOSIS — Z5111 Encounter for antineoplastic chemotherapy: Secondary | ICD-10-CM | POA: Diagnosis not present

## 2018-02-28 DIAGNOSIS — D509 Iron deficiency anemia, unspecified: Secondary | ICD-10-CM | POA: Diagnosis not present

## 2018-02-28 DIAGNOSIS — Z9049 Acquired absence of other specified parts of digestive tract: Secondary | ICD-10-CM | POA: Diagnosis not present

## 2018-02-28 DIAGNOSIS — Z79899 Other long term (current) drug therapy: Secondary | ICD-10-CM | POA: Diagnosis not present

## 2018-02-28 DIAGNOSIS — C189 Malignant neoplasm of colon, unspecified: Secondary | ICD-10-CM

## 2018-02-28 DIAGNOSIS — Z933 Colostomy status: Secondary | ICD-10-CM | POA: Diagnosis not present

## 2018-02-28 DIAGNOSIS — C19 Malignant neoplasm of rectosigmoid junction: Secondary | ICD-10-CM | POA: Diagnosis not present

## 2018-02-28 MED ORDER — SODIUM CHLORIDE 0.9% FLUSH
10.0000 mL | INTRAVENOUS | Status: DC | PRN
  Administered 2018-02-28: 10 mL
  Filled 2018-02-28: qty 10

## 2018-02-28 MED ORDER — HEPARIN SOD (PORK) LOCK FLUSH 100 UNIT/ML IV SOLN
500.0000 [IU] | Freq: Once | INTRAVENOUS | Status: AC | PRN
  Administered 2018-02-28: 500 [IU]
  Filled 2018-02-28: qty 5

## 2018-02-28 NOTE — Patient Instructions (Signed)
Implanted Port Home Guide An implanted port is a type of central line that is placed under the skin. Central lines are used to provide IV access when treatment or nutrition needs to be given through a person's veins. Implanted ports are used for long-term IV access. An implanted port may be placed because:  You need IV medicine that would be irritating to the small veins in your hands or arms.  You need long-term IV medicines, such as antibiotics.  You need IV nutrition for a long period.  You need frequent blood draws for lab tests.  You need dialysis.  Implanted ports are usually placed in the chest area, but they can also be placed in the upper arm, the abdomen, or the leg. An implanted port has two main parts:  Reservoir. The reservoir is round and will appear as a small, raised area under your skin. The reservoir is the part where a needle is inserted to give medicines or draw blood.  Catheter. The catheter is a thin, flexible tube that extends from the reservoir. The catheter is placed into a large vein. Medicine that is inserted into the reservoir goes into the catheter and then into the vein.  How will I care for my incision site? Do not get the incision site wet. Bathe or shower as directed by your health care provider. How is my port accessed? Special steps must be taken to access the port:  Before the port is accessed, a numbing cream can be placed on the skin. This helps numb the skin over the port site.  Your health care provider uses a sterile technique to access the port. ? Your health care provider must put on a mask and sterile gloves. ? The skin over your port is cleaned carefully with an antiseptic and allowed to dry. ? The port is gently pinched between sterile gloves, and a needle is inserted into the port.  Only "non-coring" port needles should be used to access the port. Once the port is accessed, a blood return should be checked. This helps ensure that the port  is in the vein and is not clogged.  If your port needs to remain accessed for a constant infusion, a clear (transparent) bandage will be placed over the needle site. The bandage and needle will need to be changed every week, or as directed by your health care provider.  Keep the bandage covering the needle clean and dry. Do not get it wet. Follow your health care provider's instructions on how to take a shower or bath while the port is accessed.  If your port does not need to stay accessed, no bandage is needed over the port.  What is flushing? Flushing helps keep the port from getting clogged. Follow your health care provider's instructions on how and when to flush the port. Ports are usually flushed with saline solution or a medicine called heparin. The need for flushing will depend on how the port is used.  If the port is used for intermittent medicines or blood draws, the port will need to be flushed: ? After medicines have been given. ? After blood has been drawn. ? As part of routine maintenance.  If a constant infusion is running, the port may not need to be flushed.  How long will my port stay implanted? The port can stay in for as long as your health care provider thinks it is needed. When it is time for the port to come out, surgery will be   done to remove it. The procedure is similar to the one performed when the port was put in. When should I seek immediate medical care? When you have an implanted port, you should seek immediate medical care if:  You notice a bad smell coming from the incision site.  You have swelling, redness, or drainage at the incision site.  You have more swelling or pain at the port site or the surrounding area.  You have a fever that is not controlled with medicine.  This information is not intended to replace advice given to you by your health care provider. Make sure you discuss any questions you have with your health care provider. Document  Released: 08/15/2005 Document Revised: 01/21/2016 Document Reviewed: 04/22/2013 Elsevier Interactive Patient Education  2017 [employer]  

## 2018-03-11 ENCOUNTER — Other Ambulatory Visit: Payer: Self-pay | Admitting: Oncology

## 2018-03-13 ENCOUNTER — Inpatient Hospital Stay: Payer: Medicaid Other

## 2018-03-13 ENCOUNTER — Encounter: Payer: Self-pay | Admitting: Nurse Practitioner

## 2018-03-13 ENCOUNTER — Inpatient Hospital Stay (HOSPITAL_BASED_OUTPATIENT_CLINIC_OR_DEPARTMENT_OTHER): Payer: Medicaid Other | Admitting: Nurse Practitioner

## 2018-03-13 ENCOUNTER — Ambulatory Visit: Payer: Self-pay

## 2018-03-13 VITALS — BP 120/73 | HR 79 | Temp 98.2°F | Resp 18 | Ht 64.0 in | Wt 103.8 lb

## 2018-03-13 DIAGNOSIS — D509 Iron deficiency anemia, unspecified: Secondary | ICD-10-CM | POA: Diagnosis not present

## 2018-03-13 DIAGNOSIS — Z933 Colostomy status: Secondary | ICD-10-CM | POA: Diagnosis not present

## 2018-03-13 DIAGNOSIS — C19 Malignant neoplasm of rectosigmoid junction: Secondary | ICD-10-CM | POA: Diagnosis not present

## 2018-03-13 DIAGNOSIS — Z9049 Acquired absence of other specified parts of digestive tract: Secondary | ICD-10-CM

## 2018-03-13 DIAGNOSIS — Z79899 Other long term (current) drug therapy: Secondary | ICD-10-CM

## 2018-03-13 DIAGNOSIS — C189 Malignant neoplasm of colon, unspecified: Secondary | ICD-10-CM

## 2018-03-13 DIAGNOSIS — Z95828 Presence of other vascular implants and grafts: Secondary | ICD-10-CM

## 2018-03-13 DIAGNOSIS — Z5111 Encounter for antineoplastic chemotherapy: Secondary | ICD-10-CM | POA: Diagnosis not present

## 2018-03-13 LAB — CMP (CANCER CENTER ONLY)
ALBUMIN: 3.8 g/dL (ref 3.5–5.0)
ALT: 39 U/L (ref 0–44)
ANION GAP: 9 (ref 5–15)
AST: 40 U/L (ref 15–41)
Alkaline Phosphatase: 92 U/L (ref 38–126)
BUN: 14 mg/dL (ref 6–20)
CO2: 24 mmol/L (ref 22–32)
Calcium: 9.1 mg/dL (ref 8.9–10.3)
Chloride: 106 mmol/L (ref 98–111)
Creatinine: 0.69 mg/dL (ref 0.44–1.00)
GFR, Estimated: >60 mL/min (ref >60)
Glucose, Bld: 116 mg/dL — ABNORMAL HIGH (ref 70–99)
POTASSIUM: 3.7 mmol/L (ref 3.5–5.1)
SODIUM: 139 mmol/L (ref 135–145)
TOTAL PROTEIN: 7.8 g/dL (ref 6.5–8.1)
Total Bilirubin: 0.5 mg/dL (ref 0.3–1.2)

## 2018-03-13 LAB — CBC WITH DIFFERENTIAL (CANCER CENTER ONLY)
BASOS ABS: 0 10*3/uL (ref 0.0–0.1)
Basophils Relative: 0 %
EOS PCT: 3 %
Eosinophils Absolute: 0.2 10*3/uL (ref 0.0–0.5)
HCT: 33.1 % — ABNORMAL LOW (ref 34.8–46.6)
HEMOGLOBIN: 10.1 g/dL — AB (ref 11.6–15.9)
Lymphocytes Relative: 30 %
Lymphs Abs: 1.3 10*3/uL (ref 0.9–3.3)
MCH: 21.2 pg — ABNORMAL LOW (ref 25.1–34.0)
MCHC: 30.5 g/dL — ABNORMAL LOW (ref 31.5–36.0)
MCV: 69.4 fL — ABNORMAL LOW (ref 79.5–101.0)
Monocytes Absolute: 0.8 10*3/uL (ref 0.1–0.9)
Monocytes Relative: 18 %
NEUTROS ABS: 2.1 10*3/uL (ref 1.5–6.5)
NEUTROS PCT: 49 %
PLATELETS: 134 10*3/uL — AB (ref 145–400)
RBC: 4.77 MIL/uL (ref 3.70–5.45)
RDW: 23.3 % — ABNORMAL HIGH (ref 11.2–14.5)
WBC: 4.4 10*3/uL (ref 3.9–10.3)

## 2018-03-13 MED ORDER — SODIUM CHLORIDE 0.9 % IV SOLN
2400.0000 mg/m2 | INTRAVENOUS | Status: DC
  Administered 2018-03-13: 3400 mg via INTRAVENOUS
  Filled 2018-03-13: qty 68

## 2018-03-13 MED ORDER — DEXAMETHASONE SODIUM PHOSPHATE 10 MG/ML IJ SOLN
10.0000 mg | Freq: Once | INTRAMUSCULAR | Status: AC
  Administered 2018-03-13: 10 mg via INTRAVENOUS

## 2018-03-13 MED ORDER — SODIUM CHLORIDE 0.9% FLUSH
10.0000 mL | Freq: Once | INTRAVENOUS | Status: AC
  Administered 2018-03-13: 10 mL
  Filled 2018-03-13: qty 10

## 2018-03-13 MED ORDER — DEXTROSE 5 % IV SOLN
Freq: Once | INTRAVENOUS | Status: AC
  Administered 2018-03-13: 12:00:00 via INTRAVENOUS

## 2018-03-13 MED ORDER — OXALIPLATIN CHEMO INJECTION 100 MG/20ML
65.0000 mg/m2 | Freq: Once | INTRAVENOUS | Status: AC
  Administered 2018-03-13: 90 mg via INTRAVENOUS
  Filled 2018-03-13: qty 18

## 2018-03-13 MED ORDER — FLUOROURACIL CHEMO INJECTION 2.5 GM/50ML
400.0000 mg/m2 | Freq: Once | INTRAVENOUS | Status: AC
  Administered 2018-03-13: 550 mg via INTRAVENOUS
  Filled 2018-03-13: qty 11

## 2018-03-13 MED ORDER — PALONOSETRON HCL INJECTION 0.25 MG/5ML
0.2500 mg | Freq: Once | INTRAVENOUS | Status: AC
  Administered 2018-03-13: 0.25 mg via INTRAVENOUS

## 2018-03-13 MED ORDER — LEUCOVORIN CALCIUM INJECTION 350 MG
400.0000 mg/m2 | Freq: Once | INTRAVENOUS | Status: AC
  Administered 2018-03-13: 564 mg via INTRAVENOUS
  Filled 2018-03-13: qty 28.2

## 2018-03-13 MED ORDER — PALONOSETRON HCL INJECTION 0.25 MG/5ML
INTRAVENOUS | Status: AC
  Filled 2018-03-13: qty 5

## 2018-03-13 MED ORDER — DEXAMETHASONE SODIUM PHOSPHATE 10 MG/ML IJ SOLN
INTRAMUSCULAR | Status: AC
  Filled 2018-03-13: qty 1

## 2018-03-13 NOTE — Patient Instructions (Signed)
[facility] Discharge Instructions for Patients Receiving Chemotherapy  Today you received the following chemotherapy agents :  Oxaliplatin,  Leucovorin,  Fluorouracil.  To help prevent nausea and vomiting after your treatment, we encourage you to take your nausea medication as prescribed.   If you develop nausea and vomiting that is not controlled by your nausea medication, call the clinic.   BELOW ARE SYMPTOMS THAT SHOULD BE REPORTED IMMEDIATELY:  *FEVER GREATER THAN 100.5 F  *CHILLS WITH OR WITHOUT FEVER  NAUSEA AND VOMITING THAT IS NOT CONTROLLED WITH YOUR NAUSEA MEDICATION  *UNUSUAL SHORTNESS OF BREATH  *UNUSUAL BRUISING OR BLEEDING  TENDERNESS IN MOUTH AND THROAT WITH OR WITHOUT PRESENCE OF ULCERS  *URINARY PROBLEMS  *BOWEL PROBLEMS  UNUSUAL RASH Items with * indicate a potential emergency and should be followed up as soon as possible.  Feel free to call the clinic should you have any questions or concerns. The clinic phone number is [phone].  Please show the CHEMO ALERT CARD at check-in to the Emergency Department and triage nurse.   
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
## 2018-03-15 ENCOUNTER — Inpatient Hospital Stay: Payer: Medicaid Other

## 2018-03-15 VITALS — BP 124/72 | HR 72 | Temp 98.6°F | Resp 17

## 2018-03-15 DIAGNOSIS — D509 Iron deficiency anemia, unspecified: Secondary | ICD-10-CM | POA: Diagnosis not present

## 2018-03-15 DIAGNOSIS — Z9049 Acquired absence of other specified parts of digestive tract: Secondary | ICD-10-CM | POA: Diagnosis not present

## 2018-03-15 DIAGNOSIS — Z79899 Other long term (current) drug therapy: Secondary | ICD-10-CM | POA: Diagnosis not present

## 2018-03-15 DIAGNOSIS — Z5111 Encounter for antineoplastic chemotherapy: Secondary | ICD-10-CM | POA: Diagnosis not present

## 2018-03-15 DIAGNOSIS — C19 Malignant neoplasm of rectosigmoid junction: Secondary | ICD-10-CM | POA: Diagnosis not present

## 2018-03-15 DIAGNOSIS — C189 Malignant neoplasm of colon, unspecified: Secondary | ICD-10-CM

## 2018-03-15 DIAGNOSIS — Z933 Colostomy status: Secondary | ICD-10-CM | POA: Diagnosis not present

## 2018-03-15 MED ORDER — SODIUM CHLORIDE 0.9% FLUSH
10.0000 mL | INTRAVENOUS | Status: DC | PRN
  Administered 2018-03-15: 10 mL
  Filled 2018-03-15: qty 10

## 2018-03-15 MED ORDER — HEPARIN SOD (PORK) LOCK FLUSH 100 UNIT/ML IV SOLN
500.0000 [IU] | Freq: Once | INTRAVENOUS | Status: AC | PRN
  Administered 2018-03-15: 500 [IU]
  Filled 2018-03-15: qty 5

## 2018-03-25 ENCOUNTER — Other Ambulatory Visit: Payer: Self-pay | Admitting: Oncology

## 2018-03-26 ENCOUNTER — Inpatient Hospital Stay: Payer: Medicaid Other

## 2018-03-26 ENCOUNTER — Inpatient Hospital Stay (HOSPITAL_BASED_OUTPATIENT_CLINIC_OR_DEPARTMENT_OTHER): Payer: Medicaid Other | Admitting: Nurse Practitioner

## 2018-03-26 ENCOUNTER — Encounter: Payer: Self-pay | Admitting: Nurse Practitioner

## 2018-03-26 VITALS — BP 113/67 | HR 67 | Temp 98.0°F | Resp 18 | Ht 64.0 in | Wt 105.0 lb

## 2018-03-26 DIAGNOSIS — C19 Malignant neoplasm of rectosigmoid junction: Secondary | ICD-10-CM

## 2018-03-26 DIAGNOSIS — Z95828 Presence of other vascular implants and grafts: Secondary | ICD-10-CM

## 2018-03-26 DIAGNOSIS — Z5111 Encounter for antineoplastic chemotherapy: Secondary | ICD-10-CM | POA: Diagnosis not present

## 2018-03-26 DIAGNOSIS — C189 Malignant neoplasm of colon, unspecified: Secondary | ICD-10-CM

## 2018-03-26 DIAGNOSIS — Z933 Colostomy status: Secondary | ICD-10-CM

## 2018-03-26 DIAGNOSIS — Z79899 Other long term (current) drug therapy: Secondary | ICD-10-CM

## 2018-03-26 DIAGNOSIS — Z9049 Acquired absence of other specified parts of digestive tract: Secondary | ICD-10-CM | POA: Diagnosis not present

## 2018-03-26 DIAGNOSIS — D509 Iron deficiency anemia, unspecified: Secondary | ICD-10-CM

## 2018-03-26 LAB — CBC WITH DIFFERENTIAL (CANCER CENTER ONLY)
BASOS ABS: 0 10*3/uL (ref 0.0–0.1)
Basophils Relative: 1 %
Eosinophils Absolute: 0.2 10*3/uL (ref 0.0–0.5)
Eosinophils Relative: 5 %
HEMATOCRIT: 33.1 % — AB (ref 34.8–46.6)
Hemoglobin: 10.2 g/dL — ABNORMAL LOW (ref 11.6–15.9)
LYMPHS ABS: 1.4 10*3/uL (ref 0.9–3.3)
LYMPHS PCT: 36 %
MCH: 21.8 pg — ABNORMAL LOW (ref 25.1–34.0)
MCHC: 30.8 g/dL — ABNORMAL LOW (ref 31.5–36.0)
MCV: 70.9 fL — AB (ref 79.5–101.0)
Monocytes Absolute: 0.6 10*3/uL (ref 0.1–0.9)
Monocytes Relative: 14 %
NEUTROS PCT: 44 %
Neutro Abs: 1.8 10*3/uL (ref 1.5–6.5)
PLATELETS: 142 10*3/uL — AB (ref 145–400)
RBC: 4.67 MIL/uL (ref 3.70–5.45)
RDW: 22 % — ABNORMAL HIGH (ref 11.2–14.5)
WBC: 4 10*3/uL (ref 3.9–10.3)

## 2018-03-26 LAB — CMP (CANCER CENTER ONLY)
ALT: 26 U/L (ref 0–44)
AST: 31 U/L (ref 15–41)
Albumin: 3.7 g/dL (ref 3.5–5.0)
Alkaline Phosphatase: 86 U/L (ref 38–126)
Anion gap: 5 (ref 5–15)
BILIRUBIN TOTAL: 0.6 mg/dL (ref 0.3–1.2)
BUN: 11 mg/dL (ref 6–20)
CHLORIDE: 108 mmol/L (ref 98–111)
CO2: 27 mmol/L (ref 22–32)
Calcium: 8.8 mg/dL — ABNORMAL LOW (ref 8.9–10.3)
Creatinine: 0.65 mg/dL (ref 0.44–1.00)
GFR, Est AFR Am: >60 mL/min (ref >60)
Glucose, Bld: 130 mg/dL — ABNORMAL HIGH (ref 70–99)
POTASSIUM: 3.9 mmol/L (ref 3.5–5.1)
Sodium: 140 mmol/L (ref 135–145)
TOTAL PROTEIN: 7.3 g/dL (ref 6.5–8.1)

## 2018-03-26 MED ORDER — PALONOSETRON HCL INJECTION 0.25 MG/5ML
INTRAVENOUS | Status: AC
  Filled 2018-03-26: qty 5

## 2018-03-26 MED ORDER — DEXAMETHASONE SODIUM PHOSPHATE 10 MG/ML IJ SOLN
INTRAMUSCULAR | Status: AC
  Filled 2018-03-26: qty 1

## 2018-03-26 MED ORDER — PALONOSETRON HCL INJECTION 0.25 MG/5ML
0.2500 mg | Freq: Once | INTRAVENOUS | Status: AC
  Administered 2018-03-26: 0.25 mg via INTRAVENOUS

## 2018-03-26 MED ORDER — FLUOROURACIL CHEMO INJECTION 2.5 GM/50ML
400.0000 mg/m2 | Freq: Once | INTRAVENOUS | Status: AC
  Administered 2018-03-26: 550 mg via INTRAVENOUS
  Filled 2018-03-26: qty 11

## 2018-03-26 MED ORDER — LEUCOVORIN CALCIUM INJECTION 350 MG
400.0000 mg/m2 | Freq: Once | INTRAVENOUS | Status: AC
  Administered 2018-03-26: 564 mg via INTRAVENOUS
  Filled 2018-03-26: qty 28.2

## 2018-03-26 MED ORDER — SODIUM CHLORIDE 0.9 % IV SOLN
2400.0000 mg/m2 | INTRAVENOUS | Status: DC
  Administered 2018-03-26: 3400 mg via INTRAVENOUS
  Filled 2018-03-26: qty 68

## 2018-03-26 MED ORDER — DEXTROSE 5 % IV SOLN
Freq: Once | INTRAVENOUS | Status: AC
  Administered 2018-03-26: 12:00:00 via INTRAVENOUS
  Filled 2018-03-26: qty 250

## 2018-03-26 MED ORDER — SODIUM CHLORIDE 0.9% FLUSH
10.0000 mL | Freq: Once | INTRAVENOUS | Status: AC
  Administered 2018-03-26: 10 mL
  Filled 2018-03-26: qty 10

## 2018-03-26 MED ORDER — DEXAMETHASONE SODIUM PHOSPHATE 10 MG/ML IJ SOLN
10.0000 mg | Freq: Once | INTRAMUSCULAR | Status: AC
  Administered 2018-03-26: 10 mg via INTRAVENOUS

## 2018-03-26 MED ORDER — OXALIPLATIN CHEMO INJECTION 100 MG/20ML
65.0000 mg/m2 | Freq: Once | INTRAVENOUS | Status: AC
  Administered 2018-03-26: 90 mg via INTRAVENOUS
  Filled 2018-03-26: qty 18
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
## 2018-03-26 NOTE — Patient Instructions (Addendum)
[facility] Discharge Instructions for Patients Receiving Chemotherapy Return Wednesday, Aug. 1 at 1230  Today you received the following chemotherapy agents:  Oxaliplatin, Leucovorin, Fluorouracil  To help prevent nausea and vomiting after your treatment, we encourage you to take your nausea medication as prescribed. DO NOT TAKE ZOFRAN FOR THREE DAYS AFTER TREATMENT  If you develop nausea and vomiting that is not controlled by your nausea medication, call the clinic.   BELOW ARE SYMPTOMS THAT SHOULD BE REPORTED IMMEDIATELY:  *FEVER GREATER THAN 100.5 F  *CHILLS WITH OR WITHOUT FEVER  NAUSEA AND VOMITING THAT IS NOT CONTROLLED WITH YOUR NAUSEA MEDICATION  *UNUSUAL SHORTNESS OF BREATH  *UNUSUAL BRUISING OR BLEEDING  TENDERNESS IN MOUTH AND THROAT WITH OR WITHOUT PRESENCE OF ULCERS  *URINARY PROBLEMS  *BOWEL PROBLEMS  UNUSUAL RASH Items with * indicate a potential emergency and should be followed up as soon as possible.  Feel free to call the clinic should you have any questions or concerns. The clinic phone number is [phone].  Please show the [facility] at check-in to the Emergency Department and triage nurse.

## 2018-03-28 ENCOUNTER — Inpatient Hospital Stay (HOSPITAL_BASED_OUTPATIENT_CLINIC_OR_DEPARTMENT_OTHER): Payer: Medicaid Other

## 2018-03-28 VITALS — BP 115/70 | HR 70 | Temp 97.9°F | Resp 18

## 2018-03-28 DIAGNOSIS — Z5111 Encounter for antineoplastic chemotherapy: Secondary | ICD-10-CM | POA: Diagnosis not present

## 2018-03-28 DIAGNOSIS — Z79899 Other long term (current) drug therapy: Secondary | ICD-10-CM

## 2018-03-28 DIAGNOSIS — Z9049 Acquired absence of other specified parts of digestive tract: Secondary | ICD-10-CM | POA: Diagnosis not present

## 2018-03-28 DIAGNOSIS — C19 Malignant neoplasm of rectosigmoid junction: Secondary | ICD-10-CM | POA: Diagnosis not present

## 2018-03-28 DIAGNOSIS — D509 Iron deficiency anemia, unspecified: Secondary | ICD-10-CM

## 2018-03-28 DIAGNOSIS — Z933 Colostomy status: Secondary | ICD-10-CM | POA: Diagnosis not present

## 2018-03-28 DIAGNOSIS — C189 Malignant neoplasm of colon, unspecified: Secondary | ICD-10-CM

## 2018-03-28 MED ORDER — HEPARIN SOD (PORK) LOCK FLUSH 100 UNIT/ML IV SOLN
500.0000 [IU] | Freq: Once | INTRAVENOUS | Status: AC | PRN
  Administered 2018-03-28: 500 [IU]
  Filled 2018-03-28: qty 5

## 2018-03-28 MED ORDER — SODIUM CHLORIDE 0.9% FLUSH
10.0000 mL | INTRAVENOUS | Status: DC | PRN
  Administered 2018-03-28: 10 mL
  Filled 2018-03-28: qty 10

## 2018-03-29 DIAGNOSIS — C189 Malignant neoplasm of colon, unspecified: Secondary | ICD-10-CM | POA: Diagnosis not present

## 2018-04-08 ENCOUNTER — Other Ambulatory Visit: Payer: Self-pay | Admitting: Oncology

## 2018-04-09 ENCOUNTER — Inpatient Hospital Stay (HOSPITAL_BASED_OUTPATIENT_CLINIC_OR_DEPARTMENT_OTHER): Payer: Medicaid Other | Admitting: Oncology

## 2018-04-09 ENCOUNTER — Inpatient Hospital Stay: Payer: Medicaid Other | Attending: Nurse Practitioner

## 2018-04-09 ENCOUNTER — Inpatient Hospital Stay: Payer: Medicaid Other

## 2018-04-09 VITALS — BP 108/73 | HR 77 | Temp 98.5°F | Resp 18 | Ht 64.0 in | Wt 107.3 lb

## 2018-04-09 DIAGNOSIS — Z933 Colostomy status: Secondary | ICD-10-CM | POA: Insufficient documentation

## 2018-04-09 DIAGNOSIS — C19 Malignant neoplasm of rectosigmoid junction: Secondary | ICD-10-CM | POA: Diagnosis not present

## 2018-04-09 DIAGNOSIS — C189 Malignant neoplasm of colon, unspecified: Secondary | ICD-10-CM

## 2018-04-09 DIAGNOSIS — D509 Iron deficiency anemia, unspecified: Secondary | ICD-10-CM | POA: Diagnosis not present

## 2018-04-09 DIAGNOSIS — Z79899 Other long term (current) drug therapy: Secondary | ICD-10-CM

## 2018-04-09 DIAGNOSIS — Z95828 Presence of other vascular implants and grafts: Secondary | ICD-10-CM

## 2018-04-09 DIAGNOSIS — Z9049 Acquired absence of other specified parts of digestive tract: Secondary | ICD-10-CM

## 2018-04-09 DIAGNOSIS — Z5111 Encounter for antineoplastic chemotherapy: Secondary | ICD-10-CM | POA: Diagnosis not present

## 2018-04-09 LAB — CBC WITH DIFFERENTIAL (CANCER CENTER ONLY)
Basophils Absolute: 0 10*3/uL (ref 0.0–0.1)
Basophils Relative: 1 %
EOS ABS: 0.2 10*3/uL (ref 0.0–0.5)
Eosinophils Relative: 4 %
HEMATOCRIT: 34.1 % — AB (ref 34.8–46.6)
HEMOGLOBIN: 10.4 g/dL — AB (ref 11.6–15.9)
LYMPHS ABS: 1.2 10*3/uL (ref 0.9–3.3)
LYMPHS PCT: 32 %
MCH: 22.4 pg — AB (ref 25.1–34.0)
MCHC: 30.5 g/dL — AB (ref 31.5–36.0)
MCV: 73.5 fL — AB (ref 79.5–101.0)
MONOS PCT: 12 %
Monocytes Absolute: 0.4 10*3/uL (ref 0.1–0.9)
NEUTROS ABS: 1.9 10*3/uL (ref 1.5–6.5)
NEUTROS PCT: 51 %
Platelet Count: 115 10*3/uL — ABNORMAL LOW (ref 145–400)
RBC: 4.64 MIL/uL (ref 3.70–5.45)
RDW: 21.3 % — ABNORMAL HIGH (ref 11.2–14.5)
WBC: 3.7 10*3/uL — AB (ref 3.9–10.3)

## 2018-04-09 LAB — CMP (CANCER CENTER ONLY)
ALT: 27 U/L (ref 0–44)
AST: 31 U/L (ref 15–41)
Albumin: 3.7 g/dL (ref 3.5–5.0)
Alkaline Phosphatase: 87 U/L (ref 38–126)
Anion gap: 10 (ref 5–15)
BILIRUBIN TOTAL: 0.7 mg/dL (ref 0.3–1.2)
BUN: 11 mg/dL (ref 6–20)
CALCIUM: 9 mg/dL (ref 8.9–10.3)
CO2: 22 mmol/L (ref 22–32)
CREATININE: 0.65 mg/dL (ref 0.44–1.00)
Chloride: 108 mmol/L (ref 98–111)
GFR, Est AFR Am: >60 mL/min (ref >60)
Glucose, Bld: 163 mg/dL — ABNORMAL HIGH (ref 70–99)
POTASSIUM: 3.5 mmol/L (ref 3.5–5.1)
Sodium: 140 mmol/L (ref 135–145)
TOTAL PROTEIN: 7.5 g/dL (ref 6.5–8.1)

## 2018-04-09 MED ORDER — FLUOROURACIL CHEMO INJECTION 2.5 GM/50ML
400.0000 mg/m2 | Freq: Once | INTRAVENOUS | Status: AC
  Administered 2018-04-09: 550 mg via INTRAVENOUS
  Filled 2018-04-09: qty 11

## 2018-04-09 MED ORDER — PALONOSETRON HCL INJECTION 0.25 MG/5ML
INTRAVENOUS | Status: AC
Start: 2018-04-09 — End: ?
  Filled 2018-04-09: qty 5

## 2018-04-09 MED ORDER — DEXTROSE 5 % IV SOLN
Freq: Once | INTRAVENOUS | Status: AC
  Administered 2018-04-09: 12:00:00 via INTRAVENOUS
  Filled 2018-04-09: qty 250

## 2018-04-09 MED ORDER — LEUCOVORIN CALCIUM INJECTION 350 MG
400.0000 mg/m2 | Freq: Once | INTRAVENOUS | Status: AC
  Administered 2018-04-09: 564 mg via INTRAVENOUS
  Filled 2018-04-09: qty 28.2

## 2018-04-09 MED ORDER — OXALIPLATIN CHEMO INJECTION 100 MG/20ML
65.0000 mg/m2 | Freq: Once | INTRAVENOUS | Status: AC
  Administered 2018-04-09: 90 mg via INTRAVENOUS
  Filled 2018-04-09: qty 18

## 2018-04-09 MED ORDER — SODIUM CHLORIDE 0.9% FLUSH
10.0000 mL | Freq: Once | INTRAVENOUS | Status: AC
  Administered 2018-04-09: 10 mL
  Filled 2018-04-09: qty 10

## 2018-04-09 MED ORDER — DEXAMETHASONE SODIUM PHOSPHATE 10 MG/ML IJ SOLN
10.0000 mg | Freq: Once | INTRAMUSCULAR | Status: AC
  Administered 2018-04-09: 10 mg via INTRAVENOUS

## 2018-04-09 MED ORDER — PALONOSETRON HCL INJECTION 0.25 MG/5ML
0.2500 mg | Freq: Once | INTRAVENOUS | Status: AC
  Administered 2018-04-09: 0.25 mg via INTRAVENOUS

## 2018-04-09 MED ORDER — DEXAMETHASONE SODIUM PHOSPHATE 10 MG/ML IJ SOLN
INTRAMUSCULAR | Status: AC
  Filled 2018-04-09: qty 1

## 2018-04-09 MED ORDER — FLUOROURACIL CHEMO INJECTION 5 GM/100ML
2400.0000 mg/m2 | INTRAVENOUS | Status: DC
  Administered 2018-04-09: 3400 mg via INTRAVENOUS
  Filled 2018-04-09: qty 68

## 2018-04-09 MED ORDER — DEXTROSE 5 % IV SOLN
INTRAVENOUS | Status: DC
  Administered 2018-04-09: 12:00:00 via INTRAVENOUS
  Filled 2018-04-09: qty 250

## 2018-04-09 NOTE — Patient Instructions (Signed)
[facility] Discharge Instructions for Patients Receiving Chemotherapy Return Wednesday, Aug. 1 at 1230  Today you received the following chemotherapy agents:  Oxaliplatin, Leucovorin, Fluorouracil  To help prevent nausea and vomiting after your treatment, we encourage you to take your nausea medication as prescribed. DO NOT TAKE ZOFRAN FOR THREE DAYS AFTER TREATMENT  If you develop nausea and vomiting that is not controlled by your nausea medication, call the clinic.   BELOW ARE SYMPTOMS THAT SHOULD BE REPORTED IMMEDIATELY:  *FEVER GREATER THAN 100.5 F  *CHILLS WITH OR WITHOUT FEVER  NAUSEA AND VOMITING THAT IS NOT CONTROLLED WITH YOUR NAUSEA MEDICATION  *UNUSUAL SHORTNESS OF BREATH  *UNUSUAL BRUISING OR BLEEDING  TENDERNESS IN MOUTH AND THROAT WITH OR WITHOUT PRESENCE OF ULCERS  *URINARY PROBLEMS  *BOWEL PROBLEMS  UNUSUAL RASH Items with * indicate a potential emergency and should be followed up as soon as possible.  Feel free to call the clinic should you have any questions or concerns. The clinic phone number is [phone].  Please show the [facility] at check-in to the Emergency Department and triage nurse.
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
## 2018-04-11 ENCOUNTER — Inpatient Hospital Stay: Payer: Medicaid Other

## 2018-04-11 VITALS — BP 106/83 | HR 75 | Temp 98.4°F | Resp 20

## 2018-04-11 DIAGNOSIS — Z5111 Encounter for antineoplastic chemotherapy: Secondary | ICD-10-CM | POA: Diagnosis not present

## 2018-04-11 DIAGNOSIS — Z933 Colostomy status: Secondary | ICD-10-CM | POA: Diagnosis not present

## 2018-04-11 DIAGNOSIS — Z79899 Other long term (current) drug therapy: Secondary | ICD-10-CM | POA: Diagnosis not present

## 2018-04-11 DIAGNOSIS — C19 Malignant neoplasm of rectosigmoid junction: Secondary | ICD-10-CM | POA: Diagnosis not present

## 2018-04-11 DIAGNOSIS — C189 Malignant neoplasm of colon, unspecified: Secondary | ICD-10-CM

## 2018-04-11 DIAGNOSIS — D509 Iron deficiency anemia, unspecified: Secondary | ICD-10-CM | POA: Diagnosis not present

## 2018-04-11 DIAGNOSIS — Z9049 Acquired absence of other specified parts of digestive tract: Secondary | ICD-10-CM | POA: Diagnosis not present

## 2018-04-11 MED ORDER — SODIUM CHLORIDE 0.9% FLUSH
10.0000 mL | INTRAVENOUS | Status: DC | PRN
  Administered 2018-04-11: 10 mL
  Filled 2018-04-11: qty 10

## 2018-04-11 MED ORDER — HEPARIN SOD (PORK) LOCK FLUSH 100 UNIT/ML IV SOLN
500.0000 [IU] | Freq: Once | INTRAVENOUS | Status: AC | PRN
  Administered 2018-04-11: 500 [IU]
  Filled 2018-04-11: qty 5

## 2018-04-11 NOTE — Patient Instructions (Signed)
Implanted Port Home Guide An implanted port is a type of central line that is placed under the skin. Central lines are used to provide IV access when treatment or nutrition needs to be given through a person's veins. Implanted ports are used for long-term IV access. An implanted port may be placed because:  You need IV medicine that would be irritating to the small veins in your hands or arms.  You need long-term IV medicines, such as antibiotics.  You need IV nutrition for a long period.  You need frequent blood draws for lab tests.  You need dialysis.  Implanted ports are usually placed in the chest area, but they can also be placed in the upper arm, the abdomen, or the leg. An implanted port has two main parts:  Reservoir. The reservoir is round and will appear as a small, raised area under your skin. The reservoir is the part where a needle is inserted to give medicines or draw blood.  Catheter. The catheter is a thin, flexible tube that extends from the reservoir. The catheter is placed into a large vein. Medicine that is inserted into the reservoir goes into the catheter and then into the vein.  How will I care for my incision site? Do not get the incision site wet. Bathe or shower as directed by your health care provider. How is my port accessed? Special steps must be taken to access the port:  Before the port is accessed, a numbing cream can be placed on the skin. This helps numb the skin over the port site.  Your health care provider uses a sterile technique to access the port. ? Your health care provider must put on a mask and sterile gloves. ? The skin over your port is cleaned carefully with an antiseptic and allowed to dry. ? The port is gently pinched between sterile gloves, and a needle is inserted into the port.  Only "non-coring" port needles should be used to access the port. Once the port is accessed, a blood return should be checked. This helps ensure that the port  is in the vein and is not clogged.  If your port needs to remain accessed for a constant infusion, a clear (transparent) bandage will be placed over the needle site. The bandage and needle will need to be changed every week, or as directed by your health care provider.  Keep the bandage covering the needle clean and dry. Do not get it wet. Follow your health care provider's instructions on how to take a shower or bath while the port is accessed.  If your port does not need to stay accessed, no bandage is needed over the port.  What is flushing? Flushing helps keep the port from getting clogged. Follow your health care provider's instructions on how and when to flush the port. Ports are usually flushed with saline solution or a medicine called heparin. The need for flushing will depend on how the port is used.  If the port is used for intermittent medicines or blood draws, the port will need to be flushed: ? After medicines have been given. ? After blood has been drawn. ? As part of routine maintenance.  If a constant infusion is running, the port may not need to be flushed.  How long will my port stay implanted? The port can stay in for as long as your health care provider thinks it is needed. When it is time for the port to come out, surgery will be   done to remove it. The procedure is similar to the one performed when the port was put in. When should I seek immediate medical care? When you have an implanted port, you should seek immediate medical care if:  You notice a bad smell coming from the incision site.  You have swelling, redness, or drainage at the incision site.  You have more swelling or pain at the port site or the surrounding area.  You have a fever that is not controlled with medicine.  This information is not intended to replace advice given to you by your health care provider. Make sure you discuss any questions you have with your health care provider. Document  Released: 08/15/2005 Document Revised: 01/21/2016 Document Reviewed: 04/22/2013 Elsevier Interactive Patient Education  2017 [employer]  

## 2018-04-12 ENCOUNTER — Encounter: Payer: Self-pay | Admitting: Radiation Oncology

## 2018-04-17 NOTE — Progress Notes (Signed)
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  Elizabeth Saunders presented to the emergency room on 4/20 with left lower quadrant discomfort and constipation.  Noted to have severe anemia.  CT abdomen/pelvis 12/16/2017: negative for metastatic disease.  1. 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 There are associated metallic linear density intraluminally at this level, which were not present on prior study of 02/05/2016, suggesting interval surgical or colonoscopic intervention versus foreign body. 2. Wall thickening in the descending and sigmoid colon suggesting colitis, possible stercoral given the apparent rectosigmoid stricture.  Flexible sigmoidoscopy 12/17/2017: A fungating nearly complete obstructing mass was found at the rectosigmoid at approximately 13-15 cm from the anal verge.  CT chest 12/18/2017:1. There is a 5 mm right upper lobe nodule, indeterminate. Recommend attention on follow-up.  The pathology confirmed adenocarcinoma.  The small bowel and colon were dilated.  No evidence of perforation.  The liver was without evidence of metastatic disease.  A mass was palpated in the proximal rectum.  Biopsies of colon 12/18/2017   Past/Anticipated interventions by surgeon, if any:  Dr. Kae Heller 12/18/2017 Exploratory laparotomy, partial colectomy and end colostomy    Past/Anticipated interventions by medical oncology, if any:  -Folfox 01/15/2018- present 8th cycle of folfox complete 8/26 -The plan is to complete 8 cycles of folfox prior to a course of xeloda and radiation.  The pathology [id number]) confirmed an invasive moderately differentiated adenocarcinoma.  Tumor extends into perirectal connective tissue.  Lymphovascular invasion is present.  No perineural invasion.  No tumor deposits.  The resection margins are negative.  No macroscopic tumor perforation.   2 of 17 lymph nodes contained metastatic carcinoma.  No loss of mismatch repair protein expression  Weight changes, if any: No  Bowel/Bladder complaints, if any: None  Nausea / Vomiting, if any: Yes, just feels queasy  Pain issues, if any:  Lower Back pains, limited mobility.  Any blood per rectum: No  BP 132/65 (BP Location: Left Arm, Patient Position: Sitting)   Pulse 66   Temp 98.3 F (36.8 C) (Oral)   Resp 18   Ht _0  (1.626 m)   Wt 108 lb 3.2 oz (49.1 kg)   SpO2 100%   BMI 18.57 kg/m    Wt Readings from Last 3 Encounters:  04/18/18 108 lb 3.2 oz (49.1 kg)  04/09/18 107 lb 4.8 oz (48.7 kg)  03/26/18 105 lb (47.6 kg)   SAFETY ISSUES:  Prior radiation? No  Pacemaker/ICD? No  Possible current pregnancy? No  Is the patient on methotrexate? No  Current Complaints/Details: Colostomy: emptying twice daily.

## 2018-04-18 ENCOUNTER — Ambulatory Visit
Admission: RE | Admit: 2018-04-18 | Discharge: 2018-04-18 | Disposition: A | Discharge status: Home / Self Care | Payer: Medicaid Other | Source: Ambulatory Visit | Attending: Radiation Oncology | Admitting: Radiation Oncology

## 2018-04-18 ENCOUNTER — Other Ambulatory Visit: Payer: Self-pay

## 2018-04-18 ENCOUNTER — Encounter: Payer: Self-pay | Admitting: Radiation Oncology

## 2018-04-18 VITALS — BP 132/65 | HR 66 | Temp 98.3°F | Resp 18 | Ht 64.0 in | Wt 108.2 lb

## 2018-04-18 DIAGNOSIS — Z933 Colostomy status: Secondary | ICD-10-CM | POA: Diagnosis not present

## 2018-04-18 DIAGNOSIS — C189 Malignant neoplasm of colon, unspecified: Secondary | ICD-10-CM

## 2018-04-18 DIAGNOSIS — C20 Malignant neoplasm of rectum: Secondary | ICD-10-CM | POA: Diagnosis not present

## 2018-04-18 NOTE — Progress Notes (Addendum)
Radiation Oncology         [phone] ________________________________  Name: Elizabeth Saunders        MRN: [id number]  Date of Service: 04/18/2018 DOB: Feb 14, 1956  CC:Kathi Ludwig, MD  Ladell Pier, MD     REFERRING PHYSICIAN: Ladell Pier, MD    Attestation Please see the note from Shona Simpson, PA-C from today's visit for more details of today's encounter.  I have personally performed a face to face diagnostic evaluation on this patient and devised the following assessment and plan.  The patient has been evaluated in clinic for the diagnosis of adenocarcinoma of the rectum. The patient has undergone surgery and is now a good candidate to proceed with adjuvant chemoradiation treatment.  The patient has undergone systemic treatment since resection was completed and we have now been asked to see the patient for consideration of chemoradiation treatment.  This recommendation has been discussed with the patient and all of the patient's questions were answered. We discussed the pros and cons of treatment as well as the logistics. The patient does wish to proceed with a 5-1/2 week course of radiation treatment and will undergo simulation in the near future.   Staging:  [id number] adenocarcinoma of the rectum  ------------------------------------------------  Jodelle Gross, MD, PhD      DIAGNOSIS: The encounter diagnosis was Adenocarcinoma of colon [facility]).   HISTORY OF PRESENT ILLNESS: Elizabeth Saunders is a 62 y.o. female seen at the request of Dr. Benay Spice for a history of rectal cancer. The patient was found to have anemia, and abdominal pain. She was evaluated in the hospital. She underwent work up with CT chest abdomen and pelvis during her work up in April 2019 that revealed no evidence of metastatic disease, but an indeterminate right lung nodule. She had an adenocarcinoma diagnosed during flex sigmoidoscopy with a fungating nearly obstructing mass 13-15 cm from the anal verge. She  underwent surgical resection with partial colectomy and end colostomy on 12/18/17 revealing adenocarcinoma of hte proximal rectum with LVSI, and 2/17 nodes positive for disease. She began FOLFOX on 01/15/18, and will complete her 8th cycle next Monday. She comes today to discuss the role of chemoRT.    PREVIOUS RADIATION THERAPY: No   PAST MEDICAL HISTORY:  Past Medical History:  Diagnosis Date  . Anemia 12/16/2017  . Cancer [facility])    colorectal cancer 2019       PAST SURGICAL HISTORY: Past Surgical History:  Procedure Laterality Date  . COLON RESECTION N/A 12/18/2017   Procedure: COLON RESECTION;  Surgeon: Clovis Riley, MD;  Location: [place];  Service: General;  Laterality: N/A;  . COLOSTOMY N/A 12/18/2017   Procedure: COLOSTOMY;  Surgeon: Clovis Riley, MD;  Location: [place];  Service: General;  Laterality: N/A;  . FLEXIBLE SIGMOIDOSCOPY N/A 12/17/2017   Procedure: FLEXIBLE SIGMOIDOSCOPY;  Surgeon: Yetta Flock, MD;  Location: [place];  Service: Gastroenterology;  Laterality: N/A;  . IR FLUORO GUIDE PORT INSERTION RIGHT  01/02/2018  . IR US GUIDE VASC ACCESS RIGHT  01/02/2018     FAMILY HISTORY: History reviewed. No pertinent family history.   SOCIAL HISTORY:  reports that she has never smoked. She has never used smokeless tobacco. She reports that she does not drink alcohol or use drugs.   ALLERGIES: Patient has no known allergies.   MEDICATIONS:  Current Outpatient Medications  Medication Sig Dispense Refill  . acetaminophen (TYLENOL) 325 MG tablet Take 2 tablets (650 mg total) by mouth  every 6 (six) hours as needed for mild pain or headache. 20 tablet 0  . ferrous sulfate 325 (65 FE) MG tablet Take 1 tablet (325 mg total) by mouth daily with breakfast. 30 tablet 3  . lidocaine-prilocaine (EMLA) cream Use on Port prior to coming to treatment.  Apply 1-2 hours before 30 g 3  . ondansetron (ZOFRAN ODT) 4 MG disintegrating tablet Take 1 tablet (4 mg total) by  mouth every 8 (eight) hours as needed for nausea. 10 tablet 0  . oxyCODONE (OXY IR/ROXICODONE) 5 MG immediate release tablet Take 1 tablet (5 mg total) by mouth every 4 (four) hours as needed for severe pain. 6 tablet 0  . prochlorperazine (COMPAZINE) 10 MG tablet Take 1 tablet (10 mg total) by mouth every 6 (six) hours as needed for nausea or vomiting. 30 tablet 0   No current facility-administered medications for this encounter.      REVIEW OF SYSTEMS: On review of systems, the patient reports that she is doing well overall. 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  She denies any chest pain, shortness of breath, cough, fevers, chills, night sweats, unintended weight changes. She denies any bowel or bladder disturbances, and denies abdominal pain, nausea or vomiting. She denies any new musculoskeletal or joint aches or pains. A complete review of systems is obtained and is otherwise negative.     PHYSICAL EXAM:  Wt Readings from Last 3 Encounters:  04/18/18 108 lb 3.2 oz (49.1 kg)  04/09/18 107 lb 4.8 oz (48.7 kg)  03/26/18 105 lb (47.6 kg)   Temp Readings from Last 3 Encounters:  04/18/18 98.3 F (36.8 C) (Oral)  04/11/18 98.4 F (36.9 C) (Oral)  04/09/18 98.5 F (36.9 C) (Oral)   BP Readings from Last 3 Encounters:  04/18/18 132/65  04/11/18 106/83  04/09/18 108/73   Pulse Readings from Last 3 Encounters:  04/18/18 66  04/11/18 75  04/09/18 77   Pain Assessment Pain Score: 0-No pain/10  In general this is a well appearing [place] female in no acute distress. She is alert and oriented x4 and appropriate throughout the examination. HEENT reveals that the patient is normocephalic, atraumatic. EOMs are intact. Cardiopulmonary assessment is negative for acute distress and she exhibits normal effort.    ECOG = 1  0 - Asymptomatic (Fully active, able to carry on all predisease activities without restriction)  1 - Symptomatic but completely ambulatory (Restricted  in physically strenuous activity but ambulatory and able to carry out work of a light or sedentary nature. For example, light housework, office work)  2 - Symptomatic, <50% in bed during the day (Ambulatory and capable of all self care but unable to carry out any work activities. Up and about more than 50% of waking hours)  3 - Symptomatic, >50% in bed, but not bedbound (Capable of only limited self-care, confined to bed or chair 50% or more of waking hours)  4 - Bedbound (Completely disabled. Cannot carry on any self-care. Totally confined to bed or chair)  5 - Death   Eustace Pen MM, Creech RH, Tormey DC, et al. [phone]). "Toxicity and response criteria of the [facility] Group". [place] Oncol. 5 (6): 649-55    LABORATORY DATA:  Lab Results  Component Value Date   WBC 3.7 (L) 04/09/2018   HGB 10.4 (L) 04/09/2018   HCT 34.1 (L) 04/09/2018   MCV 73.5 (L) 04/09/2018   PLT 115 (L) 04/09/2018   Lab Results  Component  Value Date   NA 140 04/09/2018   K 3.5 04/09/2018   CL 108 04/09/2018   CO2 22 04/09/2018   Lab Results  Component Value Date   ALT 27 04/09/2018   AST 31 04/09/2018   ALKPHOS 87 04/09/2018   BILITOT 0.7 04/09/2018      RADIOGRAPHY: No results found.     IMPRESSION/PLAN: 1. Stage IIIB, [id number] adenocarcinoma of the rectum. Dr. Lisbeth Renshaw discusses the pathology findings and reviews the nature of rectal carcinoma. Dr. Lisbeth Renshaw discusses the rationale to proceed with chemoRT after she completes her 8th cycle of FOLFOX. We discussed the risks, benefits, short, and long term effects of radiotherapy, and the patient is interested in proceeding. Dr. Lisbeth Renshaw discusses the delivery and logistics of radiotherapy and anticipates a course of 5 1/2 weeks of radiotherapy.Written consent is obtained and placed in the chart, a copy was provided to the patient. We will simulate next week or so and anticipate starting treatment in the next two to three weeks but will confirm  with Dr. Benay Spice.  In a visit lasting 60 minutes, greater than 50% of the time was spent face to face discussing her case, and coordinating the patient's care.   The above documentation reflects my direct findings during this shared patient visit. Please see the separate note by Dr. Lisbeth Renshaw on this date for the remainder of the patient's plan of care.    Carola Rhine, PAC

## 2018-04-19 ENCOUNTER — Ambulatory Visit: Payer: Medicaid Other | Admitting: Radiation Oncology

## 2018-04-19 ENCOUNTER — Ambulatory Visit: Payer: Medicaid Other

## 2018-04-19 NOTE — Addendum Note (Signed)
Encounter addended by: Kyung Rudd, MD on: 04/19/2018 1:09 PM  Actions taken: Sign clinical note

## 2018-04-22 ENCOUNTER — Other Ambulatory Visit: Payer: Self-pay | Admitting: Oncology

## 2018-04-23 ENCOUNTER — Telehealth: Payer: Self-pay | Admitting: Pharmacist

## 2018-04-23 ENCOUNTER — Inpatient Hospital Stay (HOSPITAL_BASED_OUTPATIENT_CLINIC_OR_DEPARTMENT_OTHER): Payer: Medicaid Other | Admitting: Nurse Practitioner

## 2018-04-23 ENCOUNTER — Encounter: Payer: Self-pay | Admitting: Nurse Practitioner

## 2018-04-23 ENCOUNTER — Inpatient Hospital Stay: Payer: Medicaid Other

## 2018-04-23 VITALS — BP 124/66 | HR 65 | Temp 98.0°F | Resp 18 | Ht 64.0 in | Wt 108.6 lb

## 2018-04-23 DIAGNOSIS — Z79899 Other long term (current) drug therapy: Secondary | ICD-10-CM

## 2018-04-23 DIAGNOSIS — Z9049 Acquired absence of other specified parts of digestive tract: Secondary | ICD-10-CM

## 2018-04-23 DIAGNOSIS — C19 Malignant neoplasm of rectosigmoid junction: Secondary | ICD-10-CM | POA: Diagnosis not present

## 2018-04-23 DIAGNOSIS — D509 Iron deficiency anemia, unspecified: Secondary | ICD-10-CM | POA: Diagnosis not present

## 2018-04-23 DIAGNOSIS — C189 Malignant neoplasm of colon, unspecified: Secondary | ICD-10-CM

## 2018-04-23 DIAGNOSIS — Z933 Colostomy status: Secondary | ICD-10-CM | POA: Diagnosis not present

## 2018-04-23 DIAGNOSIS — C20 Malignant neoplasm of rectum: Secondary | ICD-10-CM

## 2018-04-23 DIAGNOSIS — Z95828 Presence of other vascular implants and grafts: Secondary | ICD-10-CM

## 2018-04-23 DIAGNOSIS — Z5111 Encounter for antineoplastic chemotherapy: Secondary | ICD-10-CM | POA: Diagnosis not present

## 2018-04-23 LAB — CBC WITH DIFFERENTIAL (CANCER CENTER ONLY)
BASOS ABS: 0 10*3/uL (ref 0.0–0.1)
BASOS PCT: 1 %
EOS ABS: 0.1 10*3/uL (ref 0.0–0.5)
EOS PCT: 4 %
HCT: 32.3 % — ABNORMAL LOW (ref 34.8–46.6)
Hemoglobin: 10.1 g/dL — ABNORMAL LOW (ref 11.6–15.9)
Lymphocytes Relative: 31 %
Lymphs Abs: 1.1 10*3/uL (ref 0.9–3.3)
MCH: 22.7 pg — ABNORMAL LOW (ref 25.1–34.0)
MCHC: 31.3 g/dL — ABNORMAL LOW (ref 31.5–36.0)
MCV: 72.4 fL — ABNORMAL LOW (ref 79.5–101.0)
MONO ABS: 0.6 10*3/uL (ref 0.1–0.9)
Monocytes Relative: 16 %
Neutro Abs: 1.8 10*3/uL (ref 1.5–6.5)
Neutrophils Relative %: 48 %
Platelet Count: 136 10*3/uL — ABNORMAL LOW (ref 145–400)
RBC: 4.46 MIL/uL (ref 3.70–5.45)
RDW: 20.8 % — AB (ref 11.2–14.5)
WBC Count: 3.7 10*3/uL — ABNORMAL LOW (ref 3.9–10.3)

## 2018-04-23 LAB — CMP (CANCER CENTER ONLY)
ALBUMIN: 3.5 g/dL (ref 3.5–5.0)
ALK PHOS: 88 U/L (ref 38–126)
ALT: 22 U/L (ref 0–44)
ANION GAP: 8 (ref 5–15)
AST: 29 U/L (ref 15–41)
BILIRUBIN TOTAL: 0.5 mg/dL (ref 0.3–1.2)
BUN: 15 mg/dL (ref 6–20)
CALCIUM: 9.1 mg/dL (ref 8.9–10.3)
CO2: 25 mmol/L (ref 22–32)
Chloride: 108 mmol/L (ref 98–111)
Creatinine: 0.65 mg/dL (ref 0.44–1.00)
GFR, Est AFR Am: >60 mL/min (ref >60)
GFR, Estimated: >60 mL/min (ref >60)
GLUCOSE: 132 mg/dL — AB (ref 70–99)
Potassium: 3.7 mmol/L (ref 3.5–5.1)
Sodium: 141 mmol/L (ref 135–145)
TOTAL PROTEIN: 7.2 g/dL (ref 6.5–8.1)

## 2018-04-23 MED ORDER — SODIUM CHLORIDE 0.9% FLUSH
10.0000 mL | Freq: Once | INTRAVENOUS | Status: AC
  Administered 2018-04-23: 10 mL
  Filled 2018-04-23: qty 10

## 2018-04-23 MED ORDER — SODIUM CHLORIDE 0.9 % IV SOLN
2400.0000 mg/m2 | INTRAVENOUS | Status: DC
  Administered 2018-04-23: 3400 mg via INTRAVENOUS
  Filled 2018-04-23: qty 68

## 2018-04-23 MED ORDER — FLUOROURACIL CHEMO INJECTION 2.5 GM/50ML
400.0000 mg/m2 | Freq: Once | INTRAVENOUS | Status: AC
  Administered 2018-04-23: 550 mg via INTRAVENOUS
  Filled 2018-04-23: qty 11

## 2018-04-23 MED ORDER — DEXAMETHASONE SODIUM PHOSPHATE 10 MG/ML IJ SOLN
10.0000 mg | Freq: Once | INTRAMUSCULAR | Status: AC
  Administered 2018-04-23: 10 mg via INTRAVENOUS

## 2018-04-23 MED ORDER — CAPECITABINE 500 MG PO TABS: ORAL_TABLET | ORAL | 0 refills | Status: DC

## 2018-04-23 MED ORDER — DEXTROSE 5 % IV SOLN
Freq: Once | INTRAVENOUS | Status: AC
  Administered 2018-04-23: 12:00:00 via INTRAVENOUS
  Filled 2018-04-23: qty 250

## 2018-04-23 MED ORDER — HEPARIN SOD (PORK) LOCK FLUSH 100 UNIT/ML IV SOLN
500.0000 [IU] | Freq: Once | INTRAVENOUS | Status: DC | PRN
  Filled 2018-04-23: qty 5

## 2018-04-23 MED ORDER — SODIUM CHLORIDE 0.9% FLUSH
10.0000 mL | INTRAVENOUS | Status: DC | PRN
  Filled 2018-04-23: qty 10

## 2018-04-23 MED ORDER — LEUCOVORIN CALCIUM INJECTION 350 MG
400.0000 mg/m2 | Freq: Once | INTRAVENOUS | Status: AC
  Administered 2018-04-23: 564 mg via INTRAVENOUS
  Filled 2018-04-23: qty 28.2

## 2018-04-23 MED ORDER — PALONOSETRON HCL INJECTION 0.25 MG/5ML
0.2500 mg | Freq: Once | INTRAVENOUS | Status: AC
  Administered 2018-04-23: 0.25 mg via INTRAVENOUS

## 2018-04-23 MED ORDER — OXALIPLATIN CHEMO INJECTION 100 MG/20ML
65.0000 mg/m2 | Freq: Once | INTRAVENOUS | Status: AC
  Administered 2018-04-23: 90 mg via INTRAVENOUS
  Filled 2018-04-23: qty 18

## 2018-04-23 MED ORDER — PALONOSETRON HCL INJECTION 0.25 MG/5ML
INTRAVENOUS | Status: AC
  Filled 2018-04-23: qty 5

## 2018-04-23 MED ORDER — DEXAMETHASONE SODIUM PHOSPHATE 10 MG/ML IJ SOLN
INTRAMUSCULAR | Status: AC
  Filled 2018-04-23: qty 1

## 2018-04-23 NOTE — Telephone Encounter (Signed)
Oral Oncology Pharmacist Encounter  Received new prescription for Xeloda (capecitabine) for the continued adjuvant treatment of stage III rectal cancer in conjunction with radiation, planned duration 5 1/2 weeks. Patient completes cycle 8 of FOLFOX for adjuvant therapy today Planned chemoradiation start date 05/07/18  Labs from 04/23/18 assessed, OK for treatemnt. Noted pltc=136k, will continue to be monitored  Xeloda will be initiated ~840 mg/m2 BID + radiation  Current medication list in Epic reviewed, no significant DDIs with Xeloda identified.  Prescription has been e-scribed to the [facility] for benefits analysis and approval.  Oral Oncology Clinic will continue to follow for insurance authorization, copayment issues, initial counseling and start date.  Johny Drilling, PharmD, BCPS, BCOP  04/23/2018 3:45 PM Oral Oncology Clinic [phone]

## 2018-04-23 NOTE — Patient Instructions (Signed)
[facility] Discharge Instructions for Patients Receiving Chemotherapy Return Wednesday, Aug. 1 at 1230  Today you received the following chemotherapy agents:  Oxaliplatin, Leucovorin, Fluorouracil  To help prevent nausea and vomiting after your treatment, we encourage you to take your nausea medication as prescribed. DO NOT TAKE ZOFRAN FOR THREE DAYS AFTER TREATMENT  If you develop nausea and vomiting that is not controlled by your nausea medication, call the clinic.   BELOW ARE SYMPTOMS THAT SHOULD BE REPORTED IMMEDIATELY:  *FEVER GREATER THAN 100.5 F  *CHILLS WITH OR WITHOUT FEVER  NAUSEA AND VOMITING THAT IS NOT CONTROLLED WITH YOUR NAUSEA MEDICATION  *UNUSUAL SHORTNESS OF BREATH  *UNUSUAL BRUISING OR BLEEDING  TENDERNESS IN MOUTH AND THROAT WITH OR WITHOUT PRESENCE OF ULCERS  *URINARY PROBLEMS  *BOWEL PROBLEMS  UNUSUAL RASH Items with * indicate a potential emergency and should be followed up as soon as possible.  Feel free to call the clinic should you have any questions or concerns. The clinic phone number is [phone].  Please show the [place] at check-in to the Emergency Department and triage nurse.
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
## 2018-04-24 ENCOUNTER — Telehealth: Payer: Self-pay | Admitting: Nurse Practitioner

## 2018-04-24 ENCOUNTER — Other Ambulatory Visit: Payer: Self-pay | Admitting: Nurse Practitioner

## 2018-04-24 DIAGNOSIS — C20 Malignant neoplasm of rectum: Secondary | ICD-10-CM

## 2018-04-24 NOTE — Telephone Encounter (Signed)
Scheduled appt per 8/27 sch message and 8/26 los - sent reminder letter in the mail and patient to get updated schedule next visit 8/28

## 2018-04-25 ENCOUNTER — Inpatient Hospital Stay: Payer: Medicaid Other

## 2018-04-25 VITALS — BP 100/61 | HR 75 | Temp 100.0°F | Resp 16

## 2018-04-25 DIAGNOSIS — C20 Malignant neoplasm of rectum: Secondary | ICD-10-CM

## 2018-04-25 DIAGNOSIS — Z933 Colostomy status: Secondary | ICD-10-CM | POA: Diagnosis not present

## 2018-04-25 DIAGNOSIS — Z5111 Encounter for antineoplastic chemotherapy: Secondary | ICD-10-CM | POA: Diagnosis not present

## 2018-04-25 DIAGNOSIS — D509 Iron deficiency anemia, unspecified: Secondary | ICD-10-CM | POA: Diagnosis not present

## 2018-04-25 DIAGNOSIS — C19 Malignant neoplasm of rectosigmoid junction: Secondary | ICD-10-CM | POA: Diagnosis not present

## 2018-04-25 DIAGNOSIS — Z79899 Other long term (current) drug therapy: Secondary | ICD-10-CM | POA: Diagnosis not present

## 2018-04-25 DIAGNOSIS — Z9049 Acquired absence of other specified parts of digestive tract: Secondary | ICD-10-CM | POA: Diagnosis not present

## 2018-04-25 MED ORDER — SODIUM CHLORIDE 0.9% FLUSH
10.0000 mL | INTRAVENOUS | Status: DC | PRN
  Administered 2018-04-25: 10 mL
  Filled 2018-04-25: qty 10

## 2018-04-25 MED ORDER — HEPARIN SOD (PORK) LOCK FLUSH 100 UNIT/ML IV SOLN
500.0000 [IU] | Freq: Once | INTRAVENOUS | Status: AC | PRN
  Administered 2018-04-25: 500 [IU]
  Filled 2018-04-25: qty 5

## 2018-04-25 NOTE — Patient Instructions (Signed)
Implanted Port Home Guide An implanted port is a type of central line that is placed under the skin. Central lines are used to provide IV access when treatment or nutrition needs to be given through a person's veins. Implanted ports are used for long-term IV access. An implanted port may be placed because:  You need IV medicine that would be irritating to the small veins in your hands or arms.  You need long-term IV medicines, such as antibiotics.  You need IV nutrition for a long period.  You need frequent blood draws for lab tests.  You need dialysis.  Implanted ports are usually placed in the chest area, but they can also be placed in the upper arm, the abdomen, or the leg. An implanted port has two main parts:  Reservoir. The reservoir is round and will appear as a small, raised area under your skin. The reservoir is the part where a needle is inserted to give medicines or draw blood.  Catheter. The catheter is a thin, flexible tube that extends from the reservoir. The catheter is placed into a large vein. Medicine that is inserted into the reservoir goes into the catheter and then into the vein.  How will I care for my incision site? Do not get the incision site wet. Bathe or shower as directed by your health care provider. How is my port accessed? Special steps must be taken to access the port:  Before the port is accessed, a numbing cream can be placed on the skin. This helps numb the skin over the port site.  Your health care provider uses a sterile technique to access the port. ? Your health care provider must put on a mask and sterile gloves. ? The skin over your port is cleaned carefully with an antiseptic and allowed to dry. ? The port is gently pinched between sterile gloves, and a needle is inserted into the port.  Only "non-coring" port needles should be used to access the port. Once the port is accessed, a blood return should be checked. This helps ensure that the port  is in the vein and is not clogged.  If your port needs to remain accessed for a constant infusion, a clear (transparent) bandage will be placed over the needle site. The bandage and needle will need to be changed every week, or as directed by your health care provider.  Keep the bandage covering the needle clean and dry. Do not get it wet. Follow your health care provider's instructions on how to take a shower or bath while the port is accessed.  If your port does not need to stay accessed, no bandage is needed over the port.  What is flushing? Flushing helps keep the port from getting clogged. Follow your health care provider's instructions on how and when to flush the port. Ports are usually flushed with saline solution or a medicine called heparin. The need for flushing will depend on how the port is used.  If the port is used for intermittent medicines or blood draws, the port will need to be flushed: ? After medicines have been given. ? After blood has been drawn. ? As part of routine maintenance.  If a constant infusion is running, the port may not need to be flushed.  How long will my port stay implanted? The port can stay in for as long as your health care provider thinks it is needed. When it is time for the port to come out, surgery will be   done to remove it. The procedure is similar to the one performed when the port was put in. When should I seek immediate medical care? When you have an implanted port, you should seek immediate medical care if:  You notice a bad smell coming from the incision site.  You have swelling, redness, or drainage at the incision site.  You have more swelling or pain at the port site or the surrounding area.  You have a fever that is not controlled with medicine.  This information is not intended to replace advice given to you by your health care provider. Make sure you discuss any questions you have with your health care provider. Document  Released: 08/15/2005 Document Revised: 01/21/2016 Document Reviewed: 04/22/2013 Elsevier Interactive Patient Education  2017 [employer]  

## 2018-04-25 NOTE — Telephone Encounter (Signed)
Oral Chemotherapy Pharmacist Encounter   I spoke with patient's son, Romat, for overview of: Xeloda.   Counseled on administration, dosing, side effects, monitoring, drug-food interactions, safe handling, storage, and disposal.  Patient will take Xeloda 500mg  tablets, 3 tablets (1500mg ) by mouth in AM and 2 tabs (1000mg ) by mouth in PM, within 30 minutes of finishing meals, on days of radiation only.  Xeloda and radiation start date: planned for 05/07/18  Adverse effects of Xeloda include but are not limited to: fatigue, decreased blood counts, GI upset, diarrhea, and hand-foot syndrome.  Patient's son endorses skin coloration and nail bed changes on patient's hands, this is not intrusive to ADLs. We discussed continued possibility of these changes during therapy with Xeloda and how these changes will subside after the completion of fluoridopyrimide therapy. Romat also endorses a few days of extreme fatigue following each administration of FOLFOX. Patient has otherwise tolerated FOLFOX chemotherapy well.  They have anti-emetic on hand and knows to take it if nausea develops.   They will obtain anti diarrheal and alert the office of 4 or more loose stools above baseline.   Reviewed importance of keeping a medication schedule and plan for any missed doses.  Romat voiced understanding and appreciation.   All questions answered.  Medication reconciliation performed and medication/allergy list updated.  Oral Oncology Patient Advocate is working with patient and radiation oncology patient advocate to screen patient for eligibility for [place].  Xeloda will be dispensed from the [facility] for cost $45 for entire course of concurrent radiation if patient does not qualify for [place]. If [place] is secured, cost of entire course of Xeloda will be charged to this account and decreases to ~$5  Romat knows to call the office with questions or concerns. Oral Oncology  Clinic will continue to follow.  Johny Drilling, PharmD, BCPS, BCOP  04/25/2018  9:39 AM Oral Oncology Clinic [phone]

## 2018-04-26 ENCOUNTER — Ambulatory Visit
Admission: RE | Admit: 2018-04-26 | Discharge: 2018-04-26 | Disposition: A | Discharge status: Home / Self Care | Payer: Medicaid Other | Source: Ambulatory Visit | Attending: Radiation Oncology | Admitting: Radiation Oncology

## 2018-04-26 DIAGNOSIS — Z510 Encounter for antineoplastic radiation therapy: Secondary | ICD-10-CM | POA: Insufficient documentation

## 2018-04-26 DIAGNOSIS — C20 Malignant neoplasm of rectum: Secondary | ICD-10-CM | POA: Insufficient documentation

## 2018-04-27 ENCOUNTER — Telehealth: Payer: Self-pay

## 2018-04-27 DIAGNOSIS — Z510 Encounter for antineoplastic radiation therapy: Secondary | ICD-10-CM | POA: Diagnosis not present

## 2018-04-27 DIAGNOSIS — C20 Malignant neoplasm of rectum: Secondary | ICD-10-CM | POA: Diagnosis not present

## 2018-04-27 NOTE — Telephone Encounter (Signed)
Oral Oncology Patient Advocate Encounter  Ms. Godbey has been approved for the [place]. I called her son and left a message for him to call me to schedule a pick-up/delivery time for Xeloda.  Will update when I speak to him  [place] Patient [place] Phone [phone] Fax [phone]

## 2018-05-02 ENCOUNTER — Other Ambulatory Visit: Payer: Self-pay | Admitting: Internal Medicine

## 2018-05-04 ENCOUNTER — Encounter: Payer: Self-pay | Admitting: Radiation Oncology

## 2018-05-04 MED FILL — XELODA 500 MG TABLET: 500 | 38 days supply | Qty: 140 | Fill #0

## 2018-05-04 NOTE — Telephone Encounter (Signed)
Oral Oncology Patient Advocate Encounter  Confirmed with [place] [facility] that Xeloda was picked up on 05/04/18.   [place] Patient [place] Phone [phone] Fax [phone]

## 2018-05-04 NOTE — Progress Notes (Signed)
Patient has been approved for the [place] on 04/26/2018

## 2018-05-07 ENCOUNTER — Ambulatory Visit
Admission: RE | Admit: 2018-05-07 | Discharge: 2018-05-07 | Disposition: A | Discharge status: Home / Self Care | Payer: Medicaid Other | Source: Ambulatory Visit | Attending: Radiation Oncology | Admitting: Radiation Oncology

## 2018-05-07 ENCOUNTER — Inpatient Hospital Stay: Payer: Medicaid Other

## 2018-05-07 DIAGNOSIS — C20 Malignant neoplasm of rectum: Secondary | ICD-10-CM | POA: Insufficient documentation

## 2018-05-07 DIAGNOSIS — Z933 Colostomy status: Secondary | ICD-10-CM

## 2018-05-07 DIAGNOSIS — D509 Iron deficiency anemia, unspecified: Secondary | ICD-10-CM | POA: Insufficient documentation

## 2018-05-07 DIAGNOSIS — Z79899 Other long term (current) drug therapy: Secondary | ICD-10-CM

## 2018-05-07 DIAGNOSIS — D701 Agranulocytosis secondary to cancer chemotherapy: Secondary | ICD-10-CM | POA: Insufficient documentation

## 2018-05-07 DIAGNOSIS — D696 Thrombocytopenia, unspecified: Secondary | ICD-10-CM | POA: Insufficient documentation

## 2018-05-07 DIAGNOSIS — Z510 Encounter for antineoplastic radiation therapy: Secondary | ICD-10-CM | POA: Insufficient documentation

## 2018-05-07 DIAGNOSIS — Z9221 Personal history of antineoplastic chemotherapy: Secondary | ICD-10-CM

## 2018-05-07 LAB — CBC WITH DIFFERENTIAL (CANCER CENTER ONLY)
Basophils Absolute: 0 10*3/uL (ref 0.0–0.1)
Basophils Relative: 1 %
EOS PCT: 5 %
Eosinophils Absolute: 0.2 10*3/uL (ref 0.0–0.5)
HEMATOCRIT: 35.6 % (ref 34.8–46.6)
Hemoglobin: 10.8 g/dL — ABNORMAL LOW (ref 11.6–15.9)
LYMPHS PCT: 33 %
Lymphs Abs: 1.5 10*3/uL (ref 0.9–3.3)
MCH: 23.2 pg — ABNORMAL LOW (ref 25.1–34.0)
MCHC: 30.3 g/dL — AB (ref 31.5–36.0)
MCV: 76.6 fL — AB (ref 79.5–101.0)
MONO ABS: 0.7 10*3/uL (ref 0.1–0.9)
MONOS PCT: 15 %
NEUTROS ABS: 2.1 10*3/uL (ref 1.5–6.5)
Neutrophils Relative %: 46 %
Platelet Count: 104 10*3/uL — ABNORMAL LOW (ref 145–400)
RBC: 4.65 MIL/uL (ref 3.70–5.45)
RDW: 19.1 % — AB (ref 11.2–14.5)
WBC Count: 4.5 10*3/uL (ref 3.9–10.3)

## 2018-05-08 ENCOUNTER — Ambulatory Visit
Admission: RE | Admit: 2018-05-08 | Discharge: 2018-05-08 | Disposition: A | Discharge status: Home / Self Care | Payer: Medicaid Other | Source: Ambulatory Visit | Attending: Radiation Oncology | Admitting: Radiation Oncology

## 2018-05-08 DIAGNOSIS — Z510 Encounter for antineoplastic radiation therapy: Secondary | ICD-10-CM | POA: Diagnosis not present

## 2018-05-08 DIAGNOSIS — C20 Malignant neoplasm of rectum: Secondary | ICD-10-CM | POA: Diagnosis not present

## 2018-05-08 NOTE — Telephone Encounter (Signed)
Oral Oncology Patient Advocate Encounter  Confirmed with [facility] that Xeloda was picked up on 05/04/18   [place] Patient [place] [facility] Phone [phone] Fax [phone]

## 2018-05-09 ENCOUNTER — Ambulatory Visit
Admission: RE | Admit: 2018-05-09 | Discharge: 2018-05-09 | Disposition: A | Discharge status: Home / Self Care | Payer: Medicaid Other | Source: Ambulatory Visit | Attending: Radiation Oncology | Admitting: Radiation Oncology

## 2018-05-09 DIAGNOSIS — C20 Malignant neoplasm of rectum: Secondary | ICD-10-CM | POA: Diagnosis not present

## 2018-05-09 DIAGNOSIS — Z510 Encounter for antineoplastic radiation therapy: Secondary | ICD-10-CM | POA: Diagnosis not present

## 2018-05-10 ENCOUNTER — Ambulatory Visit
Admission: RE | Admit: 2018-05-10 | Discharge: 2018-05-10 | Disposition: A | Discharge status: Home / Self Care | Payer: Medicaid Other | Source: Ambulatory Visit | Attending: Radiation Oncology | Admitting: Radiation Oncology

## 2018-05-10 DIAGNOSIS — C20 Malignant neoplasm of rectum: Secondary | ICD-10-CM | POA: Diagnosis not present

## 2018-05-10 DIAGNOSIS — Z510 Encounter for antineoplastic radiation therapy: Secondary | ICD-10-CM | POA: Diagnosis not present

## 2018-05-11 ENCOUNTER — Ambulatory Visit
Admission: RE | Admit: 2018-05-11 | Discharge: 2018-05-11 | Disposition: A | Discharge status: Home / Self Care | Payer: Medicaid Other | Source: Ambulatory Visit | Attending: Radiation Oncology | Admitting: Radiation Oncology

## 2018-05-11 DIAGNOSIS — Z510 Encounter for antineoplastic radiation therapy: Secondary | ICD-10-CM | POA: Diagnosis not present

## 2018-05-11 DIAGNOSIS — C20 Malignant neoplasm of rectum: Secondary | ICD-10-CM | POA: Diagnosis not present

## 2018-05-11 NOTE — Progress Notes (Signed)
Pt here for patient teaching.  Pt given Radiation and You booklet and skin care instructions.  Reviewed areas of pertinence such as diarrhea, fatigue, sexual and fertility changes, skin changes and urinary and bladder changes . Pt able to give teach back of to pat skin, use baby wipes, have Imodium on hand, drink plenty of water and sitz bath,. Pt demonstrated understanding, needs reinforcement, no evidence of learning, refused teaching and  of information given and will contact nursing with any questions or concerns.     Http://rtanswers.org/treatmentinformation/whattoexpect/index

## 2018-05-14 ENCOUNTER — Ambulatory Visit
Admission: RE | Admit: 2018-05-14 | Discharge: 2018-05-14 | Disposition: A | Discharge status: Home / Self Care | Payer: Medicaid Other | Source: Ambulatory Visit | Attending: Radiation Oncology | Admitting: Radiation Oncology

## 2018-05-14 DIAGNOSIS — Z510 Encounter for antineoplastic radiation therapy: Secondary | ICD-10-CM | POA: Diagnosis not present

## 2018-05-14 DIAGNOSIS — C20 Malignant neoplasm of rectum: Secondary | ICD-10-CM | POA: Diagnosis not present

## 2018-05-15 ENCOUNTER — Inpatient Hospital Stay: Payer: Medicaid Other

## 2018-05-15 ENCOUNTER — Ambulatory Visit
Admission: RE | Admit: 2018-05-15 | Discharge: 2018-05-15 | Disposition: A | Discharge status: Home / Self Care | Payer: Medicaid Other | Source: Ambulatory Visit | Attending: Radiation Oncology | Admitting: Radiation Oncology

## 2018-05-15 ENCOUNTER — Telehealth: Payer: Self-pay

## 2018-05-15 ENCOUNTER — Encounter: Payer: Self-pay | Admitting: Oncology

## 2018-05-15 ENCOUNTER — Inpatient Hospital Stay (HOSPITAL_BASED_OUTPATIENT_CLINIC_OR_DEPARTMENT_OTHER): Payer: Medicaid Other | Admitting: Oncology

## 2018-05-15 VITALS — BP 112/68 | HR 72 | Temp 98.4°F | Resp 18 | Ht 64.0 in | Wt 109.9 lb

## 2018-05-15 DIAGNOSIS — C20 Malignant neoplasm of rectum: Secondary | ICD-10-CM

## 2018-05-15 DIAGNOSIS — Z933 Colostomy status: Secondary | ICD-10-CM

## 2018-05-15 DIAGNOSIS — D696 Thrombocytopenia, unspecified: Secondary | ICD-10-CM | POA: Diagnosis not present

## 2018-05-15 DIAGNOSIS — D701 Agranulocytosis secondary to cancer chemotherapy: Secondary | ICD-10-CM

## 2018-05-15 DIAGNOSIS — Z9221 Personal history of antineoplastic chemotherapy: Secondary | ICD-10-CM | POA: Diagnosis not present

## 2018-05-15 DIAGNOSIS — D509 Iron deficiency anemia, unspecified: Secondary | ICD-10-CM

## 2018-05-15 DIAGNOSIS — Z79899 Other long term (current) drug therapy: Secondary | ICD-10-CM | POA: Diagnosis not present

## 2018-05-15 DIAGNOSIS — Z95828 Presence of other vascular implants and grafts: Secondary | ICD-10-CM

## 2018-05-15 DIAGNOSIS — C189 Malignant neoplasm of colon, unspecified: Secondary | ICD-10-CM

## 2018-05-15 DIAGNOSIS — Z510 Encounter for antineoplastic radiation therapy: Secondary | ICD-10-CM | POA: Diagnosis not present

## 2018-05-15 LAB — CBC WITH DIFFERENTIAL (CANCER CENTER ONLY)
Basophils Absolute: 0 10*3/uL (ref 0.0–0.1)
Basophils Relative: 0 %
EOS ABS: 0.2 10*3/uL (ref 0.0–0.5)
EOS PCT: 8 %
HCT: 34.4 % — ABNORMAL LOW (ref 34.8–46.6)
Hemoglobin: 10.8 g/dL — ABNORMAL LOW (ref 11.6–15.9)
LYMPHS ABS: 0.7 10*3/uL — AB (ref 0.9–3.3)
Lymphocytes Relative: 26 %
MCH: 23.8 pg — ABNORMAL LOW (ref 25.1–34.0)
MCHC: 31.4 g/dL — ABNORMAL LOW (ref 31.5–36.0)
MCV: 75.9 fL — AB (ref 79.5–101.0)
MONO ABS: 0.7 10*3/uL (ref 0.1–0.9)
Monocytes Relative: 24 %
Neutro Abs: 1.2 10*3/uL — ABNORMAL LOW (ref 1.5–6.5)
Neutrophils Relative %: 42 %
Platelet Count: 118 10*3/uL — ABNORMAL LOW (ref 145–400)
RBC: 4.53 MIL/uL (ref 3.70–5.45)
RDW: 18.1 % — AB (ref 11.2–14.5)
WBC: 2.8 10*3/uL — AB (ref 3.9–10.3)

## 2018-05-15 LAB — CMP (CANCER CENTER ONLY)
ALBUMIN: 3.6 g/dL (ref 3.5–5.0)
ALT: 15 U/L (ref 0–44)
AST: 25 U/L (ref 15–41)
Alkaline Phosphatase: 88 U/L (ref 38–126)
Anion gap: 9 (ref 5–15)
BILIRUBIN TOTAL: 0.9 mg/dL (ref 0.3–1.2)
BUN: 10 mg/dL (ref 6–20)
CO2: 25 mmol/L (ref 22–32)
Calcium: 8.9 mg/dL (ref 8.9–10.3)
Chloride: 109 mmol/L (ref 98–111)
Creatinine: 0.62 mg/dL (ref 0.44–1.00)
GFR, Est AFR Am: >60 mL/min (ref >60)
GFR, Estimated: >60 mL/min (ref >60)
GLUCOSE: 105 mg/dL — AB (ref 70–99)
POTASSIUM: 3.5 mmol/L (ref 3.5–5.1)
Sodium: 143 mmol/L (ref 135–145)
TOTAL PROTEIN: 7.6 g/dL (ref 6.5–8.1)

## 2018-05-15 MED ORDER — SODIUM CHLORIDE 0.9% FLUSH
10.0000 mL | Freq: Once | INTRAVENOUS | Status: AC
  Administered 2018-05-15: 10 mL
  Filled 2018-05-15: qty 10

## 2018-05-15 MED ORDER — HEPARIN SOD (PORK) LOCK FLUSH 100 UNIT/ML IV SOLN
250.0000 [IU] | Freq: Once | INTRAVENOUS | Status: AC
  Administered 2018-05-15: 250 [IU]
  Filled 2018-05-15: qty 5
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
## 2018-05-15 NOTE — Telephone Encounter (Signed)
Printed avs and calender of upcoming appointment. Per 9/17 los 

## 2018-05-16 ENCOUNTER — Ambulatory Visit
Admission: RE | Admit: 2018-05-16 | Discharge: 2018-05-16 | Disposition: A | Discharge status: Home / Self Care | Payer: Medicaid Other | Source: Ambulatory Visit | Attending: Radiation Oncology | Admitting: Radiation Oncology

## 2018-05-16 DIAGNOSIS — C20 Malignant neoplasm of rectum: Secondary | ICD-10-CM | POA: Diagnosis not present

## 2018-05-16 DIAGNOSIS — Z510 Encounter for antineoplastic radiation therapy: Secondary | ICD-10-CM | POA: Diagnosis not present

## 2018-05-17 ENCOUNTER — Ambulatory Visit
Admission: RE | Admit: 2018-05-17 | Discharge: 2018-05-17 | Disposition: A | Discharge status: Home / Self Care | Payer: Medicaid Other | Source: Ambulatory Visit | Attending: Radiation Oncology | Admitting: Radiation Oncology

## 2018-05-17 DIAGNOSIS — Z510 Encounter for antineoplastic radiation therapy: Secondary | ICD-10-CM | POA: Diagnosis not present

## 2018-05-17 DIAGNOSIS — C20 Malignant neoplasm of rectum: Secondary | ICD-10-CM | POA: Diagnosis not present

## 2018-05-18 ENCOUNTER — Ambulatory Visit
Admission: RE | Admit: 2018-05-18 | Discharge: 2018-05-18 | Disposition: A | Discharge status: Home / Self Care | Payer: Medicaid Other | Source: Ambulatory Visit | Attending: Radiation Oncology | Admitting: Radiation Oncology

## 2018-05-18 DIAGNOSIS — Z510 Encounter for antineoplastic radiation therapy: Secondary | ICD-10-CM | POA: Diagnosis not present

## 2018-05-18 DIAGNOSIS — C20 Malignant neoplasm of rectum: Secondary | ICD-10-CM | POA: Diagnosis not present

## 2018-05-18 NOTE — Progress Notes (Signed)
  Radiation Oncology         [phone] ________________________________  Name: Elizabeth Saunders MRN: [id number]  Date: 04/26/2018  DOB: 1956-03-24   SIMULATION AND TREATMENT PLANNING NOTE  DIAGNOSIS:     ICD-10-CM   1. 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 The patient was placed in a supine position. A customized vac-lock bag was constructed to aid in patient immobilization on. This complex treatment device will be used on a daily basis during the treatment. In this fashion a CT scan was obtained through the pelvic region and the isocenter was placed near midline within the pelvis. Surface markings were placed.  The patient's imaging was loaded into the radiation treatment planning system. The patient will initially be planned to receive a course of radiation to a dose of 45 Gy. This will be accomplished in 25 fractions at 1.8 gray per fraction. This initial treatment will correspond to a 3-D conformal technique. The target has been contoured in addition to the rectum, bladder and femoral heads. Dose volume histograms of each of these structures have been requested and these will be carefully reviewed as part of the 3-D conformal treatment planning process. To accomplish this initial treatment, 4 customized blocks have been designed for this purpose. Each of these 4 complex treatment devices will be used on a daily basis during the initial course of the treatment. It is anticipated that the patient will then receive a boost for an additional 5.4 Gy. The anticipated total dose therefore will be 50.4 Gy.    Special treatment procedure The patient will receive chemotherapy during the course of radiation treatment. The patient may experience increased or overlapping toxicity due to this combined-modality approach and the patient will be monitored for such problems. This may include  extra lab work as necessary. This therefore constitutes a special treatment procedure.    ________________________________  Jodelle Gross, MD, PhD

## 2018-05-18 NOTE — Progress Notes (Signed)
  Radiation Oncology         [phone] ________________________________  Name: Elizabeth Saunders MRN: [id number]  Date: 04/26/2018  DOB: 1955/11/26  Optical Surface Tracking Plan:  Since intensity modulated radiotherapy (IMRT) and 3D conformal radiation treatment methods are predicated on accurate and precise positioning for treatment, intrafraction motion monitoring is medically necessary to ensure accurate and safe treatment delivery.  The ability to quantify intrafraction motion without excessive ionizing radiation dose can only be performed with optical surface tracking. Accordingly, surface imaging offers the opportunity to obtain 3D measurements of patient position throughout IMRT and 3D treatments without excessive radiation exposure.  I am ordering optical surface tracking for this patient's upcoming course of radiotherapy. ________________________________  Kyung Rudd, MD 05/18/2018 10:27 AM    Reference:   Particia Jasper, et al. Surface imaging-based analysis of intrafraction motion for breast radiotherapy patients.Journal of [address], n. 6, nov. 2014. ISSN [id number].   Available at: <http://www.jacmp.org/index.php/jacmp/article/view/4957>.

## 2018-05-21 ENCOUNTER — Ambulatory Visit
Admission: RE | Admit: 2018-05-21 | Discharge: 2018-05-21 | Disposition: A | Discharge status: Home / Self Care | Payer: Medicaid Other | Source: Ambulatory Visit | Attending: Radiation Oncology | Admitting: Radiation Oncology

## 2018-05-21 DIAGNOSIS — Z510 Encounter for antineoplastic radiation therapy: Secondary | ICD-10-CM | POA: Diagnosis not present

## 2018-05-21 DIAGNOSIS — C20 Malignant neoplasm of rectum: Secondary | ICD-10-CM | POA: Diagnosis not present

## 2018-05-22 ENCOUNTER — Inpatient Hospital Stay: Payer: Medicaid Other

## 2018-05-22 ENCOUNTER — Ambulatory Visit
Admission: RE | Admit: 2018-05-22 | Discharge: 2018-05-22 | Disposition: A | Discharge status: Home / Self Care | Payer: Medicaid Other | Source: Ambulatory Visit | Attending: Radiation Oncology | Admitting: Radiation Oncology

## 2018-05-22 DIAGNOSIS — C20 Malignant neoplasm of rectum: Secondary | ICD-10-CM

## 2018-05-22 DIAGNOSIS — Z510 Encounter for antineoplastic radiation therapy: Secondary | ICD-10-CM | POA: Diagnosis not present

## 2018-05-22 LAB — CBC WITH DIFFERENTIAL (CANCER CENTER ONLY)
BASOS ABS: 0 10*3/uL (ref 0.0–0.1)
BASOS PCT: 0 %
EOS ABS: 0.4 10*3/uL (ref 0.0–0.5)
EOS PCT: 7 %
HCT: 34.4 % — ABNORMAL LOW (ref 34.8–46.6)
HEMOGLOBIN: 10.4 g/dL — AB (ref 11.6–15.9)
LYMPHS ABS: 0.6 10*3/uL — AB (ref 0.9–3.3)
Lymphocytes Relative: 12 %
MCH: 23.4 pg — AB (ref 25.1–34.0)
MCHC: 30.2 g/dL — ABNORMAL LOW (ref 31.5–36.0)
MCV: 77.5 fL — ABNORMAL LOW (ref 79.5–101.0)
Monocytes Absolute: 0.8 10*3/uL (ref 0.1–0.9)
Monocytes Relative: 18 %
NEUTROS PCT: 63 %
Neutro Abs: 3 10*3/uL (ref 1.5–6.5)
PLATELETS: 102 10*3/uL — AB (ref 145–400)
RBC: 4.44 MIL/uL (ref 3.70–5.45)
RDW: 18.3 % — ABNORMAL HIGH (ref 11.2–14.5)
WBC: 4.8 10*3/uL (ref 3.9–10.3)

## 2018-05-23 ENCOUNTER — Ambulatory Visit
Admission: RE | Admit: 2018-05-23 | Discharge: 2018-05-23 | Disposition: A | Discharge status: Home / Self Care | Payer: Medicaid Other | Source: Ambulatory Visit | Attending: Radiation Oncology | Admitting: Radiation Oncology

## 2018-05-23 DIAGNOSIS — C20 Malignant neoplasm of rectum: Secondary | ICD-10-CM | POA: Diagnosis not present

## 2018-05-23 DIAGNOSIS — Z510 Encounter for antineoplastic radiation therapy: Secondary | ICD-10-CM | POA: Diagnosis not present

## 2018-05-24 ENCOUNTER — Ambulatory Visit
Admission: RE | Admit: 2018-05-24 | Discharge: 2018-05-24 | Disposition: A | Discharge status: Home / Self Care | Payer: Medicaid Other | Source: Ambulatory Visit | Attending: Radiation Oncology | Admitting: Radiation Oncology

## 2018-05-24 DIAGNOSIS — Z510 Encounter for antineoplastic radiation therapy: Secondary | ICD-10-CM | POA: Diagnosis not present

## 2018-05-24 DIAGNOSIS — C20 Malignant neoplasm of rectum: Secondary | ICD-10-CM | POA: Diagnosis not present

## 2018-05-25 ENCOUNTER — Ambulatory Visit
Admission: RE | Admit: 2018-05-25 | Discharge: 2018-05-25 | Disposition: A | Discharge status: Home / Self Care | Payer: Medicaid Other | Source: Ambulatory Visit | Attending: Radiation Oncology | Admitting: Radiation Oncology

## 2018-05-25 DIAGNOSIS — C20 Malignant neoplasm of rectum: Secondary | ICD-10-CM | POA: Diagnosis not present

## 2018-05-25 DIAGNOSIS — Z510 Encounter for antineoplastic radiation therapy: Secondary | ICD-10-CM | POA: Diagnosis not present

## 2018-05-28 ENCOUNTER — Ambulatory Visit
Admission: RE | Admit: 2018-05-28 | Discharge: 2018-05-28 | Disposition: A | Discharge status: Home / Self Care | Payer: Medicaid Other | Source: Ambulatory Visit | Attending: Radiation Oncology | Admitting: Radiation Oncology

## 2018-05-28 DIAGNOSIS — Z510 Encounter for antineoplastic radiation therapy: Secondary | ICD-10-CM | POA: Diagnosis not present

## 2018-05-28 DIAGNOSIS — C20 Malignant neoplasm of rectum: Secondary | ICD-10-CM | POA: Diagnosis not present

## 2018-05-29 ENCOUNTER — Ambulatory Visit
Admission: RE | Admit: 2018-05-29 | Discharge: 2018-05-29 | Disposition: A | Discharge status: Home / Self Care | Payer: Medicaid Other | Source: Ambulatory Visit | Attending: Radiation Oncology | Admitting: Radiation Oncology

## 2018-05-29 DIAGNOSIS — C20 Malignant neoplasm of rectum: Secondary | ICD-10-CM | POA: Diagnosis not present

## 2018-05-29 DIAGNOSIS — Z510 Encounter for antineoplastic radiation therapy: Secondary | ICD-10-CM | POA: Diagnosis not present

## 2018-05-30 ENCOUNTER — Encounter: Payer: Self-pay | Admitting: Nurse Practitioner

## 2018-05-30 ENCOUNTER — Telehealth: Payer: Self-pay | Admitting: Nurse Practitioner

## 2018-05-30 ENCOUNTER — Inpatient Hospital Stay: Payer: Medicaid Other | Attending: Nurse Practitioner | Admitting: Nurse Practitioner

## 2018-05-30 ENCOUNTER — Ambulatory Visit
Admission: RE | Admit: 2018-05-30 | Discharge: 2018-05-30 | Disposition: A | Discharge status: Home / Self Care | Payer: Medicaid Other | Source: Ambulatory Visit | Attending: Radiation Oncology | Admitting: Radiation Oncology

## 2018-05-30 VITALS — BP 104/76 | HR 70 | Temp 97.9°F | Resp 17 | Ht 64.0 in | Wt 110.9 lb

## 2018-05-30 DIAGNOSIS — Z933 Colostomy status: Secondary | ICD-10-CM | POA: Insufficient documentation

## 2018-05-30 DIAGNOSIS — D701 Agranulocytosis secondary to cancer chemotherapy: Secondary | ICD-10-CM | POA: Insufficient documentation

## 2018-05-30 DIAGNOSIS — C20 Malignant neoplasm of rectum: Secondary | ICD-10-CM | POA: Diagnosis not present

## 2018-05-30 DIAGNOSIS — D696 Thrombocytopenia, unspecified: Secondary | ICD-10-CM | POA: Insufficient documentation

## 2018-05-30 DIAGNOSIS — Z510 Encounter for antineoplastic radiation therapy: Secondary | ICD-10-CM | POA: Diagnosis not present

## 2018-05-30 DIAGNOSIS — R918 Other nonspecific abnormal finding of lung field: Secondary | ICD-10-CM | POA: Diagnosis not present

## 2018-05-30 DIAGNOSIS — T451X5D Adverse effect of antineoplastic and immunosuppressive drugs, subsequent encounter: Secondary | ICD-10-CM | POA: Insufficient documentation

## 2018-05-30 DIAGNOSIS — D509 Iron deficiency anemia, unspecified: Secondary | ICD-10-CM | POA: Diagnosis not present

## 2018-05-30 DIAGNOSIS — G620 Drug-induced polyneuropathy: Secondary | ICD-10-CM
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
## 2018-05-30 NOTE — Telephone Encounter (Signed)
Pt scheduled with LT due to avail of GBs schedule. Pt cannot come in the afternoon 10/28.

## 2018-05-31 ENCOUNTER — Other Ambulatory Visit: Payer: Self-pay | Admitting: Internal Medicine

## 2018-05-31 ENCOUNTER — Ambulatory Visit
Admission: RE | Admit: 2018-05-31 | Discharge: 2018-05-31 | Disposition: A | Discharge status: Home / Self Care | Payer: Medicaid Other | Source: Ambulatory Visit | Attending: Radiation Oncology | Admitting: Radiation Oncology

## 2018-05-31 DIAGNOSIS — C20 Malignant neoplasm of rectum: Secondary | ICD-10-CM | POA: Diagnosis not present

## 2018-05-31 DIAGNOSIS — D638 Anemia in other chronic diseases classified elsewhere: Secondary | ICD-10-CM

## 2018-05-31 DIAGNOSIS — Z510 Encounter for antineoplastic radiation therapy: Secondary | ICD-10-CM | POA: Diagnosis not present

## 2018-06-01 ENCOUNTER — Ambulatory Visit
Admission: RE | Admit: 2018-06-01 | Discharge: 2018-06-01 | Disposition: A | Discharge status: Home / Self Care | Payer: Medicaid Other | Source: Ambulatory Visit | Attending: Radiation Oncology | Admitting: Radiation Oncology

## 2018-06-01 DIAGNOSIS — C20 Malignant neoplasm of rectum: Secondary | ICD-10-CM | POA: Diagnosis not present

## 2018-06-01 DIAGNOSIS — Z510 Encounter for antineoplastic radiation therapy: Secondary | ICD-10-CM | POA: Diagnosis not present

## 2018-06-04 ENCOUNTER — Ambulatory Visit
Admission: RE | Admit: 2018-06-04 | Discharge: 2018-06-04 | Disposition: A | Discharge status: Home / Self Care | Payer: Medicaid Other | Source: Ambulatory Visit | Attending: Radiation Oncology | Admitting: Radiation Oncology

## 2018-06-04 DIAGNOSIS — C20 Malignant neoplasm of rectum: Secondary | ICD-10-CM | POA: Diagnosis not present

## 2018-06-04 DIAGNOSIS — Z510 Encounter for antineoplastic radiation therapy: Secondary | ICD-10-CM | POA: Diagnosis not present

## 2018-06-05 ENCOUNTER — Ambulatory Visit
Admission: RE | Admit: 2018-06-05 | Discharge: 2018-06-05 | Disposition: A | Discharge status: Home / Self Care | Payer: Medicaid Other | Source: Ambulatory Visit | Attending: Radiation Oncology | Admitting: Radiation Oncology

## 2018-06-05 DIAGNOSIS — C20 Malignant neoplasm of rectum: Secondary | ICD-10-CM | POA: Diagnosis not present

## 2018-06-05 DIAGNOSIS — Z510 Encounter for antineoplastic radiation therapy: Secondary | ICD-10-CM | POA: Diagnosis not present

## 2018-06-06 ENCOUNTER — Ambulatory Visit
Admission: RE | Admit: 2018-06-06 | Discharge: 2018-06-06 | Disposition: A | Discharge status: Home / Self Care | Payer: Medicaid Other | Source: Ambulatory Visit | Attending: Radiation Oncology | Admitting: Radiation Oncology

## 2018-06-06 DIAGNOSIS — C20 Malignant neoplasm of rectum: Secondary | ICD-10-CM | POA: Diagnosis not present

## 2018-06-06 DIAGNOSIS — Z510 Encounter for antineoplastic radiation therapy: Secondary | ICD-10-CM | POA: Diagnosis not present

## 2018-06-07 ENCOUNTER — Ambulatory Visit
Admission: RE | Admit: 2018-06-07 | Discharge: 2018-06-07 | Disposition: A | Discharge status: Home / Self Care | Payer: Medicaid Other | Source: Ambulatory Visit | Attending: Radiation Oncology | Admitting: Radiation Oncology

## 2018-06-07 DIAGNOSIS — Z510 Encounter for antineoplastic radiation therapy: Secondary | ICD-10-CM | POA: Diagnosis not present

## 2018-06-07 DIAGNOSIS — C20 Malignant neoplasm of rectum: Secondary | ICD-10-CM | POA: Diagnosis not present

## 2018-06-08 ENCOUNTER — Ambulatory Visit
Admission: RE | Admit: 2018-06-08 | Discharge: 2018-06-08 | Disposition: A | Discharge status: Home / Self Care | Payer: Medicaid Other | Source: Ambulatory Visit | Attending: Radiation Oncology | Admitting: Radiation Oncology

## 2018-06-08 DIAGNOSIS — C20 Malignant neoplasm of rectum: Secondary | ICD-10-CM | POA: Diagnosis not present

## 2018-06-08 DIAGNOSIS — Z510 Encounter for antineoplastic radiation therapy: Secondary | ICD-10-CM | POA: Diagnosis not present

## 2018-06-11 ENCOUNTER — Ambulatory Visit
Admission: RE | Admit: 2018-06-11 | Discharge: 2018-06-11 | Disposition: A | Discharge status: Home / Self Care | Payer: Medicaid Other | Source: Ambulatory Visit | Attending: Radiation Oncology | Admitting: Radiation Oncology

## 2018-06-11 DIAGNOSIS — Z510 Encounter for antineoplastic radiation therapy: Secondary | ICD-10-CM | POA: Diagnosis not present

## 2018-06-11 DIAGNOSIS — C20 Malignant neoplasm of rectum: Secondary | ICD-10-CM | POA: Diagnosis not present

## 2018-06-12 ENCOUNTER — Ambulatory Visit
Admission: RE | Admit: 2018-06-12 | Discharge: 2018-06-12 | Disposition: A | Discharge status: Home / Self Care | Payer: Medicaid Other | Source: Ambulatory Visit | Attending: Radiation Oncology | Admitting: Radiation Oncology

## 2018-06-12 DIAGNOSIS — Z510 Encounter for antineoplastic radiation therapy: Secondary | ICD-10-CM | POA: Diagnosis not present

## 2018-06-12 DIAGNOSIS — C20 Malignant neoplasm of rectum: Secondary | ICD-10-CM | POA: Diagnosis not present

## 2018-06-13 ENCOUNTER — Ambulatory Visit
Admission: RE | Admit: 2018-06-13 | Discharge: 2018-06-13 | Disposition: A | Discharge status: Home / Self Care | Payer: Medicaid Other | Source: Ambulatory Visit | Attending: Radiation Oncology | Admitting: Radiation Oncology

## 2018-06-13 ENCOUNTER — Encounter: Payer: Self-pay | Admitting: Radiation Oncology

## 2018-06-13 DIAGNOSIS — Z510 Encounter for antineoplastic radiation therapy: Secondary | ICD-10-CM | POA: Diagnosis not present

## 2018-06-13 DIAGNOSIS — C20 Malignant neoplasm of rectum: Secondary | ICD-10-CM | POA: Diagnosis not present

## 2018-06-21 NOTE — Progress Notes (Signed)
  Radiation Oncology         [phone] ________________________________  Name: Elizabeth Saunders MRN: [id number]  Date: 06/13/2018  DOB: 12/31/1955  End of Treatment Note  Diagnosis:   62 y.o. female with Stage IIIB, [id number] adenocarcinoma of the rectum    Indication for treatment:  Curative       Radiation treatment dates:   05/07/2018 - 06/13/2018  Site/dose:   The rectum was initially treated to 45 Gy in 25 fractions, followed by a 5.4 Gy boost delivered in 3 fractions, to yield a final total dose of 50.4 Gy.  Beams/energy:   3D / 15X, 10X Photon  Narrative: The patient tolerated radiation treatment very well.  She experienced diarrhea once daily and some mild itching in the treated area but otherwise did not have any significant complaints or skin irritation.  Plan: The patient has completed radiation treatment. The patient will return to radiation oncology clinic for routine followup in one month. I advised them to call or return sooner if they have any questions or concerns related to their recovery or treatment.  ------------------------------------------------  Jodelle Gross, MD, PhD  This document serves as a record of services personally performed by Kyung Rudd, MD. It was created on his behalf by Rae Lips, a trained medical scribe. The creation of this record is based on the scribe's personal observations and the provider's statements to them. This document has been checked and approved by the attending provider.

## 2018-06-25 ENCOUNTER — Encounter: Payer: Self-pay | Admitting: Nurse Practitioner

## 2018-06-25 ENCOUNTER — Inpatient Hospital Stay: Payer: Medicaid Other

## 2018-06-25 ENCOUNTER — Telehealth: Payer: Self-pay

## 2018-06-25 ENCOUNTER — Telehealth: Payer: Self-pay | Admitting: Emergency Medicine

## 2018-06-25 ENCOUNTER — Inpatient Hospital Stay (HOSPITAL_BASED_OUTPATIENT_CLINIC_OR_DEPARTMENT_OTHER): Payer: Medicaid Other | Admitting: Nurse Practitioner

## 2018-06-25 VITALS — BP 132/70 | HR 72 | Temp 97.7°F | Resp 17 | Ht 64.0 in | Wt 111.6 lb

## 2018-06-25 DIAGNOSIS — D509 Iron deficiency anemia, unspecified: Secondary | ICD-10-CM | POA: Diagnosis not present

## 2018-06-25 DIAGNOSIS — Z9221 Personal history of antineoplastic chemotherapy: Secondary | ICD-10-CM | POA: Diagnosis not present

## 2018-06-25 DIAGNOSIS — D696 Thrombocytopenia, unspecified: Secondary | ICD-10-CM | POA: Diagnosis not present

## 2018-06-25 DIAGNOSIS — Z9049 Acquired absence of other specified parts of digestive tract: Secondary | ICD-10-CM | POA: Diagnosis not present

## 2018-06-25 DIAGNOSIS — G620 Drug-induced polyneuropathy: Secondary | ICD-10-CM | POA: Diagnosis not present

## 2018-06-25 DIAGNOSIS — Z933 Colostomy status: Secondary | ICD-10-CM | POA: Diagnosis not present

## 2018-06-25 DIAGNOSIS — Z85048 Personal history of other malignant neoplasm of rectum, rectosigmoid junction, and anus: Secondary | ICD-10-CM | POA: Diagnosis not present

## 2018-06-25 DIAGNOSIS — C20 Malignant neoplasm of rectum: Secondary | ICD-10-CM

## 2018-06-25 DIAGNOSIS — R918 Other nonspecific abnormal finding of lung field: Secondary | ICD-10-CM | POA: Diagnosis not present

## 2018-06-25 DIAGNOSIS — Z95828 Presence of other vascular implants and grafts: Secondary | ICD-10-CM

## 2018-06-25 DIAGNOSIS — T451X5D Adverse effect of antineoplastic and immunosuppressive drugs, subsequent encounter: Secondary | ICD-10-CM | POA: Diagnosis not present

## 2018-06-25 DIAGNOSIS — D701 Agranulocytosis secondary to cancer chemotherapy: Secondary | ICD-10-CM | POA: Diagnosis not present

## 2018-06-25 LAB — CBC WITH DIFFERENTIAL (CANCER CENTER ONLY)
Abs Immature Granulocytes: 0.02 10*3/uL (ref 0.00–0.07)
BASOS PCT: 1 %
Basophils Absolute: 0 10*3/uL (ref 0.0–0.1)
EOS ABS: 0.3 10*3/uL (ref 0.0–0.5)
EOS PCT: 7 %
HCT: 34.2 % — ABNORMAL LOW (ref 36.0–46.0)
HEMOGLOBIN: 10.3 g/dL — AB (ref 12.0–15.0)
Immature Granulocytes: 1 %
LYMPHS PCT: 11 %
Lymphs Abs: 0.5 10*3/uL — ABNORMAL LOW (ref 0.7–4.0)
MCH: 24.2 pg — AB (ref 26.0–34.0)
MCHC: 30.1 g/dL (ref 30.0–36.0)
MCV: 80.5 fL (ref 80.0–100.0)
MONO ABS: 0.6 10*3/uL (ref 0.1–1.0)
Monocytes Relative: 14 %
NRBC: 0 % (ref 0.0–0.2)
Neutro Abs: 2.8 10*3/uL (ref 1.7–7.7)
Neutrophils Relative %: 66 %
PLATELETS: 153 10*3/uL (ref 150–400)
RBC: 4.25 MIL/uL (ref 3.87–5.11)
RDW: 17 % — AB (ref 11.5–15.5)
WBC Count: 4.2 10*3/uL (ref 4.0–10.5)

## 2018-06-25 LAB — CEA (IN HOUSE-CHCC): CEA (CHCC-In House): 1.32 ng/mL (ref 0.00–5.00)

## 2018-06-25 MED ORDER — HEPARIN SOD (PORK) LOCK FLUSH 100 UNIT/ML IV SOLN
250.0000 [IU] | Freq: Once | INTRAVENOUS | Status: DC
  Filled 2018-06-25: qty 5

## 2018-06-25 MED ORDER — SODIUM CHLORIDE 0.9% FLUSH
10.0000 mL | Freq: Once | INTRAVENOUS | Status: AC
  Administered 2018-06-25: 10 mL
  Filled 2018-06-25: qty 10

## 2018-06-25 NOTE — Telephone Encounter (Addendum)
VM left  ----- Message from Owens Shark, NP sent at 06/25/2018 11:14 AM EDT ----- Please let her know the CEA is normal.  Follow-up as scheduled.
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
## 2018-06-25 NOTE — Telephone Encounter (Signed)
Printed avs and calender of upcoming appointment. Per 10/28 los 

## 2018-06-28 ENCOUNTER — Encounter: Payer: Self-pay | Admitting: Gastroenterology

## 2018-07-24 DIAGNOSIS — Z932 Ileostomy status: Secondary | ICD-10-CM | POA: Diagnosis not present

## 2018-07-31 ENCOUNTER — Ambulatory Visit: Payer: Medicaid Other | Admitting: Gastroenterology

## 2018-07-31 ENCOUNTER — Encounter: Payer: Self-pay | Admitting: Gastroenterology

## 2018-07-31 VITALS — BP 114/66 | HR 62 | Ht 60.5 in | Wt 115.4 lb

## 2018-07-31 DIAGNOSIS — Z933 Colostomy status: Secondary | ICD-10-CM | POA: Diagnosis not present

## 2018-07-31 DIAGNOSIS — C189 Malignant neoplasm of colon, unspecified: Secondary | ICD-10-CM | POA: Diagnosis not present

## 2018-07-31 MED ORDER — SUPREP BOWEL PREP KIT 17.5-3.13-1.6 GM/177ML PO SOLN: ORAL | 0 refills | Status: DC

## 2018-07-31 NOTE — Patient Instructions (Signed)
If you are age 60 or older, your body mass index should be between 23-30. Your Body mass index is 22.16 kg/m. If this is out of the aforementioned range listed, please consider follow up with your Primary Care Provider.  If you are age 14 or younger, your body mass index should be between 19-25. Your Body mass index is 22.16 kg/m. If this is out of the aformentioned range listed, please consider follow up with your Primary Care Provider.   You have been scheduled for a colonoscopy. Please follow written instructions given to you at your visit today.  Please pick up your prep supplies at the pharmacy within the next 1-3 days. If you use inhalers (even only as needed), please bring them with you on the day of your procedure. Your physician has requested that you go to www.startemmi.com and enter the access code given to you at your visit today. This web site gives a general overview about your procedure. However, you should still follow specific instructions given to you by our office regarding your preparation for the procedure.  Please call Dr. Kae Heller at [facility] at [phone] to schedule a follow up appointment.  Thank you for entrusting me with your care and for choosing [facility], Dr. Collins Cellar

## 2018-07-31 NOTE — Progress Notes (Signed)
HPI :  62 y/o female here for a follow up visit. I met her in April when she was admitted to the hospital with profound iron deficiency and partial colonic obstruction. Flex sig showed a nearly completely obstructing mass lesion. 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 Followed by Oncology and completed chemotherapy with Folfox and Xeloda about a month ago, and also received XRT. She is hoping to have takedown of colostomy and is in need for pre-operative colonoscopy.  She has no complaints today. She has recovered quite well. Reports normal functioning of her ostomy and bowels. No abdominal pains. No blood in the stools. Weight is stable.   Flex sig 12/17/2017 - A fungating nearly completely obstructing large mass was found in the recto-sigmoid colon at an angulated turn, roughly 13-15cm from anal verge. The mass was circumferential.  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 2/17 lymph node positive. CT abdomen/pelvis 12/16/2017-negative for metastatic disease CT chest 12/18/2017-indeterminate right lung nodule    Past Medical History:  Diagnosis Date  . Anemia 12/16/2017  . Cancer [facility])    colorectal cancer 2019     Past Surgical History:  Procedure Laterality Date  . COLON RESECTION N/A 12/18/2017   Procedure: COLON RESECTION;  Surgeon: Clovis Riley, MD;  Location: [place];  Service: General;  Laterality: N/A;  . COLOSTOMY N/A 12/18/2017   Procedure: COLOSTOMY;  Surgeon: Clovis Riley, MD;  Location: [place];  Service: General;  Laterality: N/A;  . FLEXIBLE SIGMOIDOSCOPY N/A 12/17/2017   Procedure: FLEXIBLE SIGMOIDOSCOPY;  Surgeon: Yetta Flock, MD;  Location: [facility];  Service: Gastroenterology;  Laterality: N/A;  . IR FLUORO GUIDE PORT INSERTION RIGHT  01/02/2018  .  IR US GUIDE VASC ACCESS RIGHT  01/02/2018   Family History  Problem Relation Age of Onset  . Colon cancer Neg Hx   . Esophageal cancer Neg Hx    Social History   Tobacco Use  . Smoking status: Never Smoker  . Smokeless tobacco: Never Used  Substance Use Topics  . Alcohol use: No    Alcohol/week: 0.0 standard drinks  . Drug use: No   Current Outpatient Medications  Medication Sig Dispense Refill  . acetaminophen (TYLENOL) 325 MG tablet Take 2 tablets (650 mg total) by mouth every 6 (six) hours as needed for mild pain or headache. 20 tablet 0  . lidocaine-prilocaine (EMLA) cream Use on Port prior to coming to treatment.  Apply 1-2 hours before (Patient taking differently: as needed. Use on Port prior to coming to treatment.  Apply 1-2 hours before) 30 g 3  . FEROSUL 325 (65 Fe) MG tablet TAKE 1 TABLET BY MOUTH DAILY WITH BREAKFAST (Patient not taking: Reported on 07/31/2018) 90 tablet 3   No current facility-administered medications for this visit.    No Known Allergies   Review of Systems: All systems reviewed and negative except where noted in HPI.   Lab Results  Component Value Date   WBC 4.2 06/25/2018   HGB 10.3 (L) 06/25/2018   HCT 34.2 (L) 06/25/2018   MCV 80.5 06/25/2018   PLT 153 06/25/2018    Lab Results  Component Value Date   CREATININE 0.62 05/15/2018   BUN 10 05/15/2018   NA 143 05/15/2018   K 3.5 05/15/2018   CL 109 05/15/2018   CO2 25  05/15/2018    Lab Results  Component Value Date   ALT 15 05/15/2018   AST 25 05/15/2018   ALKPHOS 88 05/15/2018   BILITOT 0.9 05/15/2018      Physical Exam: BP 114/66   Pulse 62   Ht 5' 0.5" (1.537 m)   Wt 115 lb 6 oz (52.3 kg)   BMI 22.16 kg/m  Constitutional: Pleasant,well-developed, female in no acute distress. HEENT: Normocephalic and atraumatic. Conjunctivae are normal. No scleral icterus. Neck supple.  Cardiovascular: Normal rate, regular rhythm.  Pulmonary/chest: Effort normal and breath sounds  normal. No wheezing, rales or rhonchi. Abdominal: Soft, nondistended, nontender. LLQ colostomy appears c/d/i. here are no masses palpable. No hepatomegaly. Extremities: no edema Lymphadenopathy: No cervical adenopathy noted. Neurological: Alert and oriented to person place and time. Skin: Skin is warm and dry. No rashes noted. Psychiatric: Normal mood and affect. Behavior is normal.   ASSESSMENT AND PLAN: 62 year old female here for reassessment following issues:  Colon cancer / colostomy in left lower quadrant - history of stage III colon cancer status post surgical resection as well as treatment with chemotherapy and radiation. She has completed therapy and doing much better at this time. She has been referred back from oncology for colonoscopy to clear the rest of her colon (only the rectum was evaluated as the mass prohibited evaluation of the rest of her colon). I discussed risks and benefits of colonoscopy and she wanted to proceed. I also recommend that she follow-up with Dr. Windle Guard of [facility] for reevaluation and determine timing of potential takedown of her colostomy pending results of her colonoscopy. Translator present to relay all this information a community to the patient today. She appeared to verbalized understanding. Further recommendations pending the results.   [place] Cellar, MD [facility] Gastroenterology

## 2018-08-09 DIAGNOSIS — C189 Malignant neoplasm of colon, unspecified: Secondary | ICD-10-CM | POA: Diagnosis not present

## 2018-08-10 ENCOUNTER — Ambulatory Visit (AMBULATORY_SURGERY_CENTER): Payer: Medicaid Other | Admitting: Gastroenterology

## 2018-08-10 ENCOUNTER — Encounter: Payer: Self-pay | Admitting: Gastroenterology

## 2018-08-10 ENCOUNTER — Other Ambulatory Visit: Payer: Self-pay

## 2018-08-10 VITALS — BP 112/53 | HR 81 | Temp 96.9°F | Resp 18 | Ht 60.05 in | Wt 115.0 lb

## 2018-08-10 DIAGNOSIS — D125 Benign neoplasm of sigmoid colon: Secondary | ICD-10-CM | POA: Diagnosis not present

## 2018-08-10 DIAGNOSIS — Z85038 Personal history of other malignant neoplasm of large intestine: Secondary | ICD-10-CM | POA: Diagnosis not present

## 2018-08-10 DIAGNOSIS — D123 Benign neoplasm of transverse colon: Secondary | ICD-10-CM

## 2018-08-10 DIAGNOSIS — D122 Benign neoplasm of ascending colon: Secondary | ICD-10-CM

## 2018-08-10 DIAGNOSIS — C189 Malignant neoplasm of colon, unspecified: Secondary | ICD-10-CM

## 2018-08-10 DIAGNOSIS — Z1211 Encounter for screening for malignant neoplasm of colon: Secondary | ICD-10-CM | POA: Diagnosis not present

## 2018-08-10 DIAGNOSIS — K635 Polyp of colon: Secondary | ICD-10-CM

## 2018-08-10 MED ORDER — SODIUM CHLORIDE 0.9 % IV SOLN: 500.0000 mL | Freq: Once | INTRAVENOUS | Status: DC

## 2018-08-10 NOTE — Progress Notes (Signed)
To PACU, VSS. Report to [email or web address] 

## 2018-08-10 NOTE — Progress Notes (Signed)
Called to room to assist during endoscopic procedure.  Patient ID and intended procedure confirmed with present staff. Received instructions for my participation in the procedure from the performing physician.  

## 2018-08-10 NOTE — Progress Notes (Signed)
History reviewed today  Interpreter used H'Tuyet Rahlan

## 2018-08-10 NOTE — Op Note (Signed)
[place] Patient Name: Elizabeth Saunders Procedure Date: 08/10/2018 4:29 PM MRN: [id number] Endoscopist: Remo Lipps P. Havery Moros , MD Age: 62 Referring MD:  Date of Birth: 1955/11/13 Gender: Female Account #: [id number] Procedure:                Colonoscopy Indications:              High risk colon cancer surveillance: Personal                            history of colon cancer - s/p rectosigmoid                            resection, completed chemotherapy and XRT with                            diverting colostomy with rectal pouch Medicines:                Monitored Anesthesia Care Procedure:                Pre-Anesthesia Assessment:                           - Prior to the procedure, a History and Physical                            was performed, and patient medications and                            allergies were reviewed. The patient's tolerance of                            previous anesthesia was also reviewed. The risks                            and benefits of the procedure and the sedation                            options and risks were discussed with the patient.                            All questions were answered, and informed consent                            was obtained. Prior Anticoagulants: The patient has                            taken no previous anticoagulant or antiplatelet                            agents. ASA Grade Assessment: II - A patient with                            mild systemic disease. After reviewing the risks  and benefits, the patient was deemed in                            satisfactory condition to undergo the procedure.                           After obtaining informed consent, the colonoscope                            was passed under direct vision. Throughout the                            procedure, the patient's blood pressure, pulse, and                            oxygen saturations were monitored  continuously. The                            Model PCF-[id number]) scope was introduced                            through the sigmoid colostomy and advanced to the                            the cecum, identified by appendiceal orifice and                            ileocecal valve. The colonoscopy was performed                            without difficulty. The patient tolerated the                            procedure well. The quality of the bowel                            preparation was adequate. The ileocecal valve,                            appendiceal orifice, and rectum were photographed. Scope In: 4:43:12 PM Scope Out: 5:04:40 PM Scope Withdrawal Time: 0 hours 17 minutes 44 seconds  Total Procedure Duration: 0 hours 21 minutes 28 seconds  Findings:                 The perianal and digital rectal examinations were                            normal.                           A 4 mm polyp was found in the ascending colon. The                            polyp was sessile. The polyp was removed with a  cold snare. Resection and retrieval were complete.                           Four sessile polyps were found in the transverse                            colon. The polyps were 3 mm in size. These polyps                            were removed with a cold snare. Resection and                            retrieval were complete.                           A 3 mm polyp was found in the sigmoid colon. The                            polyp was sessile. The polyp was removed with a                            cold snare. Resection and retrieval were complete.                           The exam was otherwise normal throughout the                            examined colon. The ostomy was normal. Rectal                            remant appeared normal with some diversion changes                            and mucous - prep inadequate for screening purposes                             in the rectal remnant. Complications:            No immediate complications. Estimated blood loss:                            Minimal. Estimated Blood Loss:     Estimated blood loss was minimal. Impression:               - One 4 mm polyp in the ascending colon, removed                            with a cold snare. Resected and retrieved.                           - Four 3 mm polyps in the transverse colon, removed                            with a  cold snare. Resected and retrieved.                           - One 3 mm polyp in the sigmoid colon, removed with                            a cold snare. Resected and retrieved.                           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 The signs and symptoms of potential                            delayed complications were discussed with the                            patient. Return to normal activities tomorrow.                            Written discharge instructions were provided to the                            patient.                           - Resume previous diet.                           - Continue present medications.                           - Await pathology results. Remo Lipps P. Armbruster, MD 08/10/2018 5:14:26 PM This report has been signed electronically.

## 2018-08-10 NOTE — Patient Instructions (Signed)
Handout given on polyps. 6 polyps were removed today.    YOU HAD AN ENDOSCOPIC PROCEDURE TODAY AT [facility]:   Refer to the procedure report that was given to you for any specific questions about what was found during the examination.  If the procedure report does not answer your questions, please call your gastroenterologist to clarify.  If you requested that your care partner not be given the details of your procedure findings, then the procedure report has been included in a sealed envelope for you to review at your convenience later.  YOU SHOULD EXPECT: Some feelings of bloating in the abdomen. Passage of more gas than usual.  Walking can help get rid of the air that was put into your GI tract during the procedure and reduce the bloating. If you had a lower endoscopy (such as a colonoscopy or flexible sigmoidoscopy) you may notice spotting of blood in your stool or on the toilet paper. If you underwent a bowel prep for your procedure, you may not have a normal bowel movement for a few days.  Please Note:  You might notice some irritation and congestion in your nose or some drainage.  This is from the oxygen used during your procedure.  There is no need for concern and it should clear up in a day or so.  SYMPTOMS TO REPORT IMMEDIATELY:   Following lower endoscopy (colonoscopy or flexible sigmoidoscopy):  Excessive amounts of blood in the stool  Significant tenderness or worsening of abdominal pains  Swelling of the abdomen that is new, acute  Fever of 100F or higher   For urgent or emergent issues, a gastroenterologist can be reached at any hour by calling [phone].   DIET:  We do recommend a small meal at first, but then you may proceed to your regular diet.  Drink plenty of fluids but you should avoid alcoholic beverages for 24 hours.  ACTIVITY:  You should plan to take it easy for the rest of today and you should NOT DRIVE or use heavy machinery until  tomorrow (because of the sedation medicines used during the test).    FOLLOW UP: Our staff will call the number listed on your records the next business day following your procedure to check on you and address any questions or concerns that you may have regarding the information given to you following your procedure. If we do not reach you, we will leave a message.  However, if you are feeling well and you are not experiencing any problems, there is no need to return our call.  We will assume that you have returned to your regular daily activities without incident.  If any biopsies were taken you will be contacted by phone or by letter within the next 1-3 weeks.  Please call us at [phone] if you have not heard about the biopsies in 3 weeks.    SIGNATURES/CONFIDENTIALITY: You and/or your care partner have signed paperwork which will be entered into your electronic medical record.  These signatures attest to the fact that that the information above on your After Visit Summary has been reviewed and is understood.  Full responsibility of the confidentiality of this discharge information lies with you and/or your care-partner.

## 2018-08-13 ENCOUNTER — Telehealth: Payer: Self-pay | Admitting: *Deleted

## 2018-08-13 NOTE — Telephone Encounter (Signed)
  Follow up Call-  Call back number 08/10/2018  Post procedure Call Back phone  # Call Romat  [phone]  (son)  Permission to leave phone message Yes  Some recent data might be hidden     Patient questions:  Do you have a fever, pain , or abdominal swelling? No. Pain Score  0 *  Have you tolerated food without any problems? Yes.    Have you been able to return to your normal activities? Yes.    Do you have any questions about your discharge instructions: Diet   No. Medications  No. Follow up visit  No.  Do you have questions or concerns about your Care? No.  Actions: * If pain score is 4 or above: No action needed, pain <4.

## 2018-08-13 NOTE — Telephone Encounter (Signed)
No answer, message left for the patient. 

## 2018-08-16 ENCOUNTER — Other Ambulatory Visit: Payer: Self-pay | Admitting: Urology

## 2018-08-16 ENCOUNTER — Ambulatory Visit: Payer: Self-pay | Admitting: Surgery

## 2018-08-20 ENCOUNTER — Encounter: Payer: Self-pay | Admitting: Gastroenterology

## 2018-09-21 ENCOUNTER — Other Ambulatory Visit: Payer: Self-pay | Admitting: *Deleted

## 2018-09-21 DIAGNOSIS — C20 Malignant neoplasm of rectum: Secondary | ICD-10-CM

## 2018-09-24 ENCOUNTER — Inpatient Hospital Stay: Payer: Medicaid Other | Attending: Oncology

## 2018-09-24 ENCOUNTER — Inpatient Hospital Stay (HOSPITAL_BASED_OUTPATIENT_CLINIC_OR_DEPARTMENT_OTHER): Payer: Medicaid Other | Admitting: Oncology

## 2018-09-24 ENCOUNTER — Telehealth: Payer: Self-pay

## 2018-09-24 VITALS — BP 135/74 | HR 72 | Temp 98.3°F | Resp 17 | Ht 60.0 in | Wt 112.9 lb

## 2018-09-24 DIAGNOSIS — Z85048 Personal history of other malignant neoplasm of rectum, rectosigmoid junction, and anus: Secondary | ICD-10-CM

## 2018-09-24 DIAGNOSIS — Z923 Personal history of irradiation: Secondary | ICD-10-CM | POA: Insufficient documentation

## 2018-09-24 DIAGNOSIS — Z933 Colostomy status: Secondary | ICD-10-CM | POA: Diagnosis not present

## 2018-09-24 DIAGNOSIS — C20 Malignant neoplasm of rectum: Secondary | ICD-10-CM

## 2018-09-24 DIAGNOSIS — Z9221 Personal history of antineoplastic chemotherapy: Secondary | ICD-10-CM | POA: Insufficient documentation

## 2018-09-24 LAB — CEA (IN HOUSE-CHCC): CEA (CHCC-IN HOUSE): 1.85 ng/mL (ref 0.00–5.00)
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
## 2018-09-24 NOTE — Telephone Encounter (Signed)
Priinted avs and calender of upcoming appointment. Per 1/27 los

## 2018-09-24 NOTE — Progress Notes (Signed)
Presents today with family member and interpreter. Feeling well. Looking forward to having colostomy reversed and port removed on 10/05/18.

## 2018-09-26 NOTE — Patient Instructions (Addendum)
Elizabeth Saunders  09/26/2018   Your procedure is scheduled on: 10-05-18    Report to [facility] Main  Entrance    Report to Admitting at 5:30 AM    Call this number if you have problems the morning of surgery [phone]    Remember: DRINK 2 PRESURGERY ENSURE DRINKS THE NIGHT BEFORE SURGERY AT  1000 PM AND 1 PRESURGERY DRINK THE DAY OF THE PROCEDURE 3 HOURS PRIOR TO SCHEDULED SURGERY. NO SOLIDS AFTER MIDNIGHT THE DAY PRIOR TO THE SURGERY. NOTHING BY MOUTH EXCEPT CLEAR LIQUIDS UNTIL THREE HOURS PRIOR TO SCHEDULED SURGERY. PLEASE FINISH PRESURGERY ENSURE DRINK PER SURGEON ORDER 3 HOURS PRIOR TO SCHEDULED SURGERY TIME WHICH NEEDS TO BE COMPLETED AT 4:30 AM.     CLEAR LIQUID DIET   Foods Allowed                                                                     Foods Excluded  Coffee and tea, regular and decaf                             liquids that you cannot  Plain Jell-O in any flavor                                             see through such as: Fruit ices (not with fruit pulp)                                     milk, soups, orange juice  Iced Popsicles                                    All solid food Carbonated beverages, regular and diet                                    Cranberry, grape and apple juices Sports drinks like Gatorade Lightly seasoned clear broth or consume(fat free) Sugar, honey syrup  Sample Menu Breakfast                                Lunch                                     Supper Cranberry juice                    Beef broth                            Chicken broth Jell-O  Grape juice                           Apple juice Coffee or tea                        Jell-O                                      Popsicle                                                Coffee or tea                        Coffee or tea  _____________________________________________________________________      BRUSH YOUR  TEETH MORNING OF SURGERY AND RINSE YOUR MOUTH OUT, NO CHEWING GUM CANDY OR MINTS.     Take these medicines the morning of surgery with A SIP OF WATER: None                                 You may not have any metal on your body including hair pins and              piercings  Do not wear jewelry, make-up, lotions, powders or perfumes, deodorant             Do not wear nail polish.  Do not shave  48 hours prior to surgery.               Do not bring valuables to the hospital. [place].  Contacts, dentures or bridgework may not be worn into surgery.  Leave suitcase in the car. After surgery it may be brought to your room.     Patients discharged the day of surgery will not be allowed to drive home. IF YOU ARE HAVING SURGERY AND GOING HOME THE SAME DAY, YOU MUST HAVE AN ADULT TO DRIVE YOU HOME AND BE WITH YOU FOR 24 HOURS. YOU MAY GO HOME BY TAXI OR UBER OR ORTHERWISE, BUT AN ADULT MUST ACCOMPANY YOU HOME AND STAY WITH YOU FOR 24 HOURS.      Special Instructions: N/A              Please read over the following fact sheets you were given: _____________________________________________________________________  [facility] - Preparing for Surgery Before surgery, you can play an important role.  Because skin is not sterile, your skin needs to be as free of germs as possible.  You can reduce the number of germs on your skin by washing with CHG (chlorahexidine gluconate) soap before surgery.  CHG is an antiseptic cleaner which kills germs and bonds with the skin to continue killing germs even after washing. Please DO NOT use if you have an allergy to CHG or antibacterial soaps.  If your skin becomes reddened/irritated stop using the CHG and inform your nurse when you arrive at Short Stay. Do not shave (including legs and underarms) for at least 48 hours prior to the  first CHG shower.  You may shave your face/neck. Please follow these instructions  carefully:  1.  Shower with CHG Soap the night before surgery and the  morning of Surgery.  2.  If you choose to wash your hair, wash your hair first as usual with your  normal  shampoo.  3.  After you shampoo, rinse your hair and body thoroughly to remove the  shampoo.                           4.  Use CHG as you would any other liquid soap.  You can apply chg directly  to the skin and wash                       Gently with a scrungie or clean washcloth.  5.  Apply the CHG Soap to your body ONLY FROM THE NECK DOWN.   Do not use on face/ open                           Wound or open sores. Avoid contact with eyes, ears mouth and genitals (private parts).                       Wash face,  Genitals (private parts) with your normal soap.             6.  Wash thoroughly, paying special attention to the area where your surgery  will be performed.  7.  Thoroughly rinse your body with warm water from the neck down.  8.  DO NOT shower/wash with your normal soap after using and rinsing off  the CHG Soap.                9.  Pat yourself dry with a clean towel.            10.  Wear clean pajamas.            11.  Place clean sheets on your bed the night of your first shower and do not  sleep with pets. Day of Surgery : Do not apply any lotions/deodorants the morning of surgery.  Please wear clean clothes to the hospital/surgery center.  FAILURE TO FOLLOW THESE INSTRUCTIONS MAY RESULT IN THE CANCELLATION OF YOUR SURGERY PATIENT SIGNATURE_________________________________  NURSE SIGNATURE__________________________________  ________________________________________________________________________   WHAT IS A BLOOD TRANSFUSION? Blood Transfusion Information  A transfusion is the replacement of blood or some of its parts. Blood is made up of multiple cells which provide different functions.  Red blood cells carry oxygen and are used for blood loss replacement.  White blood cells fight against  infection.  Platelets control bleeding.  Plasma helps clot blood.  Other blood products are available for specialized needs, such as hemophilia or other clotting disorders. BEFORE THE TRANSFUSION  Who gives blood for transfusions?   Healthy volunteers who are fully evaluated to make sure their blood is safe. This is blood bank blood. Transfusion therapy is the safest it has ever been in the practice of medicine. Before blood is taken from a donor, a complete history is taken to make sure that person has no history of diseases nor engages in risky social behavior (examples are intravenous drug use or sexual activity with multiple partners). The donor's travel history is screened to minimize risk of transmitting infections, such as malaria. The donated  blood is tested for signs of infectious diseases, such as HIV and hepatitis. The blood is then tested to be sure it is compatible with you in order to minimize the chance of a transfusion reaction. If you or a relative donates blood, this is often done in anticipation of surgery and is not appropriate for emergency situations. It takes many days to process the donated blood. RISKS AND COMPLICATIONS Although transfusion therapy is very safe and saves many lives, the main dangers of transfusion include:   Getting an infectious disease.  Developing a transfusion reaction. This is an allergic reaction to something in the blood you were given. Every precaution is taken to prevent this. The decision to have a blood transfusion has been considered carefully by your caregiver before blood is given. Blood is not given unless the benefits outweigh the risks. AFTER THE TRANSFUSION  Right after receiving a blood transfusion, you will usually feel much better and more energetic. This is especially true if your red blood cells have gotten low (anemic). The transfusion raises the level of the red blood cells which carry oxygen, and this usually causes an energy  increase.  The nurse administering the transfusion will monitor you carefully for complications. HOME CARE INSTRUCTIONS  No special instructions are needed after a transfusion. You may find your energy is better. Speak with your caregiver about any limitations on activity for underlying diseases you may have. SEEK MEDICAL CARE IF:   Your condition is not improving after your transfusion.  You develop redness or irritation at the intravenous (IV) site. SEEK IMMEDIATE MEDICAL CARE IF:  Any of the following symptoms occur over the next 12 hours:  Shaking chills.  You have a temperature by mouth above 102 F (38.9 C), not controlled by medicine.  Chest, back, or muscle pain.  People around you feel you are not acting correctly or are confused.  Shortness of breath or difficulty breathing.  Dizziness and fainting.  You get a rash or develop hives.  You have a decrease in urine output.  Your urine turns a dark color or changes to pink, red, or brown. Any of the following symptoms occur over the next 10 days:  You have a temperature by mouth above 102 F (38.9 C), not controlled by medicine.  Shortness of breath.  Weakness after normal activity.  The white part of the eye turns yellow (jaundice).  You have a decrease in the amount of urine or are urinating less often.  Your urine turns a dark color or changes to pink, red, or brown. Document Released: 08/12/2000 Document Revised: 11/07/2011 Document Reviewed: 03/31/2008 [facility] Patient Information 2014 [place], [place].  _______________________________________________________________________

## 2018-09-27 ENCOUNTER — Ambulatory Visit (HOSPITAL_COMMUNITY)
Admission: RE | Admit: 2018-09-27 | Discharge: 2018-09-27 | Disposition: A | Discharge status: Home / Self Care | Payer: Medicaid Other | Source: Ambulatory Visit | Attending: Surgery | Admitting: Surgery

## 2018-09-27 ENCOUNTER — Encounter (HOSPITAL_COMMUNITY)
Admission: RE | Admit: 2018-09-27 | Discharge: 2018-09-27 | Disposition: A | Discharge status: Home / Self Care | Payer: Medicaid Other | Source: Ambulatory Visit | Attending: Surgery | Admitting: Surgery

## 2018-09-27 ENCOUNTER — Encounter (HOSPITAL_COMMUNITY): Payer: Self-pay

## 2018-09-27 ENCOUNTER — Other Ambulatory Visit: Payer: Self-pay

## 2018-09-27 DIAGNOSIS — Z01810 Encounter for preprocedural cardiovascular examination: Secondary | ICD-10-CM | POA: Insufficient documentation

## 2018-09-27 DIAGNOSIS — I517 Cardiomegaly: Secondary | ICD-10-CM | POA: Diagnosis not present

## 2018-09-27 LAB — CBC WITH DIFFERENTIAL/PLATELET
Abs Immature Granulocytes: 0.03 10*3/uL (ref 0.00–0.07)
Basophils Absolute: 0 10*3/uL (ref 0.0–0.1)
Basophils Relative: 0 %
Eosinophils Absolute: 0.3 10*3/uL (ref 0.0–0.5)
Eosinophils Relative: 5 %
HCT: 38.6 % (ref 36.0–46.0)
Hemoglobin: 11.2 g/dL — ABNORMAL LOW (ref 12.0–15.0)
Immature Granulocytes: 1 %
Lymphocytes Relative: 14 %
Lymphs Abs: 0.9 10*3/uL (ref 0.7–4.0)
MCH: 22.8 pg — ABNORMAL LOW (ref 26.0–34.0)
MCHC: 29 g/dL — ABNORMAL LOW (ref 30.0–36.0)
MCV: 78.5 fL — ABNORMAL LOW (ref 80.0–100.0)
Monocytes Absolute: 0.5 10*3/uL (ref 0.1–1.0)
Monocytes Relative: 7 %
Neutro Abs: 4.9 10*3/uL (ref 1.7–7.7)
Neutrophils Relative %: 73 %
PLATELETS: 240 10*3/uL (ref 150–400)
RBC: 4.92 MIL/uL (ref 3.87–5.11)
RDW: 14.8 % (ref 11.5–15.5)
WBC: 6.7 10*3/uL (ref 4.0–10.5)
nRBC: 0 % (ref 0.0–0.2)

## 2018-09-27 LAB — ABO/RH: ABO/RH(D): AB POS

## 2018-09-27 LAB — COMPREHENSIVE METABOLIC PANEL
ALT: 17 U/L (ref 0–44)
ANION GAP: 9 (ref 5–15)
AST: 27 U/L (ref 15–41)
Albumin: 4.1 g/dL (ref 3.5–5.0)
Alkaline Phosphatase: 89 U/L (ref 38–126)
BUN: 13 mg/dL (ref 6–20)
CO2: 23 mmol/L (ref 22–32)
Calcium: 8.6 mg/dL — ABNORMAL LOW (ref 8.9–10.3)
Chloride: 107 mmol/L (ref 98–111)
Creatinine, Ser: 0.54 mg/dL (ref 0.44–1.00)
GFR calc Af Amer: >60 mL/min (ref >60)
GFR calc non Af Amer: >60 mL/min (ref >60)
Glucose, Bld: 103 mg/dL — ABNORMAL HIGH (ref 70–99)
POTASSIUM: 4.6 mmol/L (ref 3.5–5.1)
Sodium: 139 mmol/L (ref 135–145)
Total Bilirubin: 0.8 mg/dL (ref 0.3–1.2)
Total Protein: 7.6 g/dL (ref 6.5–8.1)

## 2018-09-27 LAB — HEMOGLOBIN A1C
Hgb A1c MFr Bld: 5.2 % (ref 4.8–5.6)
Mean Plasma Glucose: 102.54 mg/dL

## 2018-09-27 LAB — SURGICAL PCR SCREEN
MRSA, PCR: NEGATIVE
STAPHYLOCOCCUS AUREUS: NEGATIVE

## 2018-09-27 NOTE — Consult Note (Signed)
[place] Nurse ostomy consult note  [place] Nurse requested for preoperative stoma site marking by Dr. Kae Heller. Patient has an existing colostomy and is scheduled for takedown on 10/05/2018, laparoscopic vs open.  Discussed surgical procedure and stoma creation with patient using an interpreter.  Patient had difficulty understanding that the ostomy takedown might result in another temporary ostomy, this time an ileostomy on the right side.  Answered patient questions. She consents to stoma marking, but very much wishes for an ostomy takedown without another ostomy created. She does not want to have a third surgery for takedown of a potential new ostomy.  Examined patient sitting, leaning forward and standing in order to place the marking in the patient's visual field, away from any creases or abdominal contour issues and within the rectus muscle.    Marked for ileostomy in the RLQ  4.5cm to the right of the umbilicus and 2cm below the umbilicus.  Patient's abdomen cleansed with CHG wipes at site marking, allowed to air dry prior to marking.Covered mark with thin film transparent dressing to preserve mark until date of surgery, 10/05/2018.  Patient given skin marking pen to reinforce mark in the event it fades with routine bathing   [facility] nursing team will remain available to this patient, the nursing, surgical and medical teams.  Please re-consult if an ostomy is created intraoperatively. Thanks, Maudie Flakes, MSN, RN, [place], Arther Abbott  Pager# [phone]

## 2018-10-04 MED ORDER — BUPIVACAINE LIPOSOME 1.3 % IJ SUSP
20.0000 mL | Freq: Once | INTRAMUSCULAR | Status: DC
  Filled 2018-10-04: qty 20

## 2018-10-04 NOTE — Anesthesia Preprocedure Evaluation (Addendum)
Anesthesia Evaluation  Patient identified by MRN, date of birth, ID band Patient awake    Reviewed: Allergy & Precautions, NPO status , Patient's Chart, lab work & pertinent test results  History of Anesthesia Complications Negative for: history of anesthetic complications  Airway Mallampati: III  TM Distance: >3 FB Neck ROM: Full  Mouth opening: Limited Mouth Opening  Dental  (+) Dental Advisory Given, Teeth Intact   Pulmonary neg pulmonary ROS,    breath sounds clear to auscultation       Cardiovascular negative cardio ROS   Rhythm:Regular Rate:Normal     Neuro/Psych negative neurological ROS  negative psych ROS   GI/Hepatic Neg liver ROS,  Colorectal cancer    Endo/Other  negative endocrine ROS  Renal/GU negative Renal ROS     Musculoskeletal negative musculoskeletal ROS (+)   Abdominal   Peds  Hematology  (+) anemia ,   Anesthesia Other Findings   Reproductive/Obstetrics                            Anesthesia Physical Anesthesia Plan  ASA: II  Anesthesia Plan: General   Post-op Pain Management:    Induction: Intravenous  PONV Risk Score and Plan: 4 or greater and Treatment may vary due to age or medical condition, Ondansetron, Midazolam, Scopolamine patch - Pre-op and Dexamethasone  Airway Management Planned: Oral ETT  Additional Equipment: None  Intra-op Plan:   Post-operative Plan: Extubation in OR  Informed Consent: I have reviewed the patients History and Physical, chart, labs and discussed the procedure including the risks, benefits and alternatives for the proposed anesthesia with the patient or authorized representative who has indicated his/her understanding and acceptance.     Dental advisory given  Plan Discussed with: CRNA and Anesthesiologist  Anesthesia Plan Comments:        Anesthesia Quick Evaluation

## 2018-10-05 ENCOUNTER — Inpatient Hospital Stay (HOSPITAL_COMMUNITY)
Admission: RE | Admit: 2018-10-05 | Discharge: 2018-10-12 | DRG: 330 | Disposition: A | Discharge status: Home / Self Care | Payer: Medicaid Other | Attending: Surgery | Admitting: Surgery

## 2018-10-05 ENCOUNTER — Encounter (HOSPITAL_COMMUNITY): Payer: Self-pay | Admitting: *Deleted

## 2018-10-05 ENCOUNTER — Inpatient Hospital Stay (HOSPITAL_COMMUNITY): Payer: Medicaid Other

## 2018-10-05 ENCOUNTER — Inpatient Hospital Stay (HOSPITAL_COMMUNITY): Payer: Medicaid Other | Admitting: Anesthesiology

## 2018-10-05 ENCOUNTER — Encounter (HOSPITAL_COMMUNITY)
Admission: RE | Disposition: A | Discharge status: Home / Self Care | Payer: Self-pay | Source: Home / Self Care | Attending: Surgery

## 2018-10-05 ENCOUNTER — Other Ambulatory Visit: Payer: Self-pay

## 2018-10-05 DIAGNOSIS — K660 Peritoneal adhesions (postprocedural) (postinfection): Secondary | ICD-10-CM | POA: Diagnosis present

## 2018-10-05 DIAGNOSIS — Z9889 Other specified postprocedural states: Secondary | ICD-10-CM

## 2018-10-05 DIAGNOSIS — Z9049 Acquired absence of other specified parts of digestive tract: Secondary | ICD-10-CM

## 2018-10-05 DIAGNOSIS — E876 Hypokalemia: Secondary | ICD-10-CM | POA: Diagnosis not present

## 2018-10-05 DIAGNOSIS — Z85048 Personal history of other malignant neoplasm of rectum, rectosigmoid junction, and anus: Secondary | ICD-10-CM

## 2018-10-05 DIAGNOSIS — Z433 Encounter for attention to colostomy: Principal | ICD-10-CM

## 2018-10-05 DIAGNOSIS — E162 Hypoglycemia, unspecified: Secondary | ICD-10-CM | POA: Diagnosis not present

## 2018-10-05 DIAGNOSIS — C20 Malignant neoplasm of rectum: Secondary | ICD-10-CM | POA: Diagnosis not present

## 2018-10-05 DIAGNOSIS — Z408 Encounter for other prophylactic surgery: Secondary | ICD-10-CM | POA: Diagnosis not present

## 2018-10-05 DIAGNOSIS — G8929 Other chronic pain: Secondary | ICD-10-CM | POA: Diagnosis present

## 2018-10-05 DIAGNOSIS — Z923 Personal history of irradiation: Secondary | ICD-10-CM

## 2018-10-05 DIAGNOSIS — Z9221 Personal history of antineoplastic chemotherapy: Secondary | ICD-10-CM

## 2018-10-05 DIAGNOSIS — K9409 Other complications of colostomy: Secondary | ICD-10-CM | POA: Diagnosis not present

## 2018-10-05 DIAGNOSIS — E872 Acidosis: Secondary | ICD-10-CM | POA: Diagnosis present

## 2018-10-05 DIAGNOSIS — Z85038 Personal history of other malignant neoplasm of large intestine: Secondary | ICD-10-CM | POA: Diagnosis not present

## 2018-10-05 DIAGNOSIS — Z452 Encounter for adjustment and management of vascular access device: Secondary | ICD-10-CM | POA: Diagnosis not present

## 2018-10-05 HISTORY — PX: ILEOSTOMY: SHX1783

## 2018-10-05 HISTORY — PX: PORT-A-CATH REMOVAL: SHX5289

## 2018-10-05 HISTORY — PX: COLOSTOMY TAKEDOWN: SHX5258

## 2018-10-05 HISTORY — PX: LAPAROSCOPIC LYSIS OF ADHESIONS: SHX5905

## 2018-10-05 HISTORY — PX: CYSTOSCOPY WITH STENT PLACEMENT: SHX5790

## 2018-10-05 LAB — TYPE AND SCREEN
ABO/RH(D): AB POS
Antibody Screen: NEGATIVE

## 2018-10-05 SURGERY — CLOSURE, COLOSTOMY, LAPAROSCOPIC
Anesthesia: General | Site: Abdomen

## 2018-10-05 MED ORDER — SODIUM CHLORIDE 0.9 % IV SOLN
2.0000 g | INTRAVENOUS | Status: AC
  Administered 2018-10-05 (×2): 2 g via INTRAVENOUS
  Filled 2018-10-05: qty 2

## 2018-10-05 MED ORDER — METOPROLOL TARTRATE 5 MG/5ML IV SOLN: 5.0000 mg | Freq: Four times a day (QID) | INTRAVENOUS | Status: DC | PRN

## 2018-10-05 MED ORDER — IOPAMIDOL (ISOVUE-300) INJECTION 61%
INTRAVENOUS | Status: DC | PRN
  Administered 2018-10-05: 14 mL via URETHRAL

## 2018-10-05 MED ORDER — FENTANYL CITRATE (PF) 100 MCG/2ML IJ SOLN
INTRAMUSCULAR | Status: AC
  Filled 2018-10-05: qty 2

## 2018-10-05 MED ORDER — DEXAMETHASONE SODIUM PHOSPHATE 10 MG/ML IJ SOLN
INTRAMUSCULAR | Status: DC | PRN
  Administered 2018-10-05: 5 mg via INTRAVENOUS

## 2018-10-05 MED ORDER — BISACODYL 5 MG PO TBEC
20.0000 mg | DELAYED_RELEASE_TABLET | Freq: Once | ORAL | Status: DC
  Filled 2018-10-05: qty 4

## 2018-10-05 MED ORDER — FENTANYL CITRATE (PF) 250 MCG/5ML IJ SOLN
INTRAMUSCULAR | Status: DC | PRN
  Administered 2018-10-05: 25 ug via INTRAVENOUS
  Administered 2018-10-05 (×5): 50 ug via INTRAVENOUS
  Administered 2018-10-05: 100 ug via INTRAVENOUS
  Administered 2018-10-05: 50 ug via INTRAVENOUS

## 2018-10-05 MED ORDER — ONDANSETRON HCL 4 MG/2ML IJ SOLN
INTRAMUSCULAR | Status: DC | PRN
  Administered 2018-10-05: 4 mg via INTRAVENOUS

## 2018-10-05 MED ORDER — ALBUMIN HUMAN 5 % IV SOLN
INTRAVENOUS | Status: AC
  Filled 2018-10-05: qty 250

## 2018-10-05 MED ORDER — KETAMINE HCL 10 MG/ML IJ SOLN
INTRAMUSCULAR | Status: DC | PRN
  Administered 2018-10-05: 30 mg via INTRAVENOUS

## 2018-10-05 MED ORDER — MIDAZOLAM HCL 2 MG/2ML IJ SOLN
INTRAMUSCULAR | Status: AC
  Filled 2018-10-05: qty 2

## 2018-10-05 MED ORDER — SODIUM CHLORIDE 0.9 % IV SOLN
INTRAVENOUS | Status: AC
  Filled 2018-10-05: qty 2

## 2018-10-05 MED ORDER — PHENYLEPHRINE 40 MCG/ML (10ML) SYRINGE FOR IV PUSH (FOR BLOOD PRESSURE SUPPORT)
PREFILLED_SYRINGE | INTRAVENOUS | Status: AC
  Filled 2018-10-05: qty 10

## 2018-10-05 MED ORDER — ROCURONIUM BROMIDE 100 MG/10ML IV SOLN
INTRAVENOUS | Status: AC
  Filled 2018-10-05: qty 1

## 2018-10-05 MED ORDER — SCOPOLAMINE 1 MG/3DAYS TD PT72
1.0000 | MEDICATED_PATCH | TRANSDERMAL | Status: DC
  Administered 2018-10-05: 1.5 mg via TRANSDERMAL

## 2018-10-05 MED ORDER — LIDOCAINE HCL 2 % IJ SOLN
INTRAMUSCULAR | Status: AC
  Filled 2018-10-05: qty 20

## 2018-10-05 MED ORDER — SUGAMMADEX SODIUM 200 MG/2ML IV SOLN
INTRAVENOUS | Status: AC
  Filled 2018-10-05: qty 2

## 2018-10-05 MED ORDER — PHENYLEPHRINE 40 MCG/ML (10ML) SYRINGE FOR IV PUSH (FOR BLOOD PRESSURE SUPPORT)
PREFILLED_SYRINGE | INTRAVENOUS | Status: DC | PRN
  Administered 2018-10-05 (×3): 80 ug via INTRAVENOUS

## 2018-10-05 MED ORDER — ALVIMOPAN 12 MG PO CAPS
12.0000 mg | ORAL_CAPSULE | Freq: Two times a day (BID) | ORAL | Status: DC
  Administered 2018-10-06 – 2018-10-07 (×3): 12 mg via ORAL
  Filled 2018-10-05 (×3): qty 1

## 2018-10-05 MED ORDER — GABAPENTIN 300 MG PO CAPS
300.0000 mg | ORAL_CAPSULE | ORAL | Status: AC
  Administered 2018-10-05: 300 mg via ORAL
  Filled 2018-10-05: qty 1

## 2018-10-05 MED ORDER — DEXAMETHASONE SODIUM PHOSPHATE 10 MG/ML IJ SOLN
INTRAMUSCULAR | Status: AC
  Filled 2018-10-05: qty 1

## 2018-10-05 MED ORDER — MIDAZOLAM HCL 2 MG/2ML IJ SOLN
INTRAMUSCULAR | Status: DC | PRN
  Administered 2018-10-05 (×2): 1 mg via INTRAVENOUS

## 2018-10-05 MED ORDER — NALOXONE HCL 0.4 MG/ML IJ SOLN: 0.4000 mg | INTRAMUSCULAR | Status: DC | PRN

## 2018-10-05 MED ORDER — KETOROLAC TROMETHAMINE 15 MG/ML IJ SOLN
15.0000 mg | Freq: Four times a day (QID) | INTRAMUSCULAR | Status: DC | PRN
  Administered 2018-10-06: 15 mg via INTRAVENOUS
  Filled 2018-10-05: qty 1

## 2018-10-05 MED ORDER — POLYETHYLENE GLYCOL 3350 17 GM/SCOOP PO POWD: 1.0000 | Freq: Once | ORAL | Status: DC

## 2018-10-05 MED ORDER — ONDANSETRON HCL 4 MG/2ML IJ SOLN
INTRAMUSCULAR | Status: AC
  Filled 2018-10-05: qty 2

## 2018-10-05 MED ORDER — ONDANSETRON HCL 4 MG PO TABS: 4.0000 mg | ORAL_TABLET | Freq: Four times a day (QID) | ORAL | Status: DC | PRN

## 2018-10-05 MED ORDER — FENTANYL CITRATE (PF) 100 MCG/2ML IJ SOLN
25.0000 ug | INTRAMUSCULAR | Status: DC | PRN
  Administered 2018-10-05: 50 ug via INTRAVENOUS

## 2018-10-05 MED ORDER — CHLORHEXIDINE GLUCONATE 4 % EX LIQD: 60.0000 mL | Freq: Once | CUTANEOUS | Status: DC

## 2018-10-05 MED ORDER — CELECOXIB 200 MG PO CAPS
200.0000 mg | ORAL_CAPSULE | ORAL | Status: AC
  Administered 2018-10-05: 200 mg via ORAL
  Filled 2018-10-05: qty 1

## 2018-10-05 MED ORDER — METOCLOPRAMIDE HCL 5 MG/ML IJ SOLN: 10.0000 mg | Freq: Four times a day (QID) | INTRAMUSCULAR | Status: DC | PRN

## 2018-10-05 MED ORDER — STERILE WATER FOR IRRIGATION IR SOLN
Status: DC | PRN
  Administered 2018-10-05: 1000 mL

## 2018-10-05 MED ORDER — SODIUM CHLORIDE 0.9 % IV SOLN
INTRAVENOUS | Status: DC
  Administered 2018-10-05 – 2018-10-07 (×7): via INTRAVENOUS

## 2018-10-05 MED ORDER — SUGAMMADEX SODIUM 200 MG/2ML IV SOLN
INTRAVENOUS | Status: DC | PRN
  Administered 2018-10-05: 110 mg via INTRAVENOUS

## 2018-10-05 MED ORDER — ACETAMINOPHEN 500 MG PO TABS
1000.0000 mg | ORAL_TABLET | Freq: Four times a day (QID) | ORAL | Status: DC
  Administered 2018-10-06 – 2018-10-10 (×13): 1000 mg via ORAL
  Filled 2018-10-05 (×12): qty 2

## 2018-10-05 MED ORDER — SODIUM CHLORIDE 0.9% FLUSH: 9.0000 mL | INTRAVENOUS | Status: DC | PRN

## 2018-10-05 MED ORDER — LACTATED RINGERS IR SOLN
Status: DC | PRN
  Administered 2018-10-05: 1000 mL

## 2018-10-05 MED ORDER — DIPHENHYDRAMINE HCL 12.5 MG/5ML PO ELIX: 12.5000 mg | ORAL_SOLUTION | Freq: Four times a day (QID) | ORAL | Status: DC | PRN

## 2018-10-05 MED ORDER — OXYCODONE HCL 5 MG PO TABS: 5.0000 mg | ORAL_TABLET | Freq: Once | ORAL | Status: DC | PRN

## 2018-10-05 MED ORDER — NEOMYCIN SULFATE 500 MG PO TABS: 1000.0000 mg | ORAL_TABLET | ORAL | Status: DC

## 2018-10-05 MED ORDER — ALVIMOPAN 12 MG PO CAPS
12.0000 mg | ORAL_CAPSULE | ORAL | Status: AC
  Administered 2018-10-05: 12 mg via ORAL
  Filled 2018-10-05: qty 1

## 2018-10-05 MED ORDER — METRONIDAZOLE 500 MG PO TABS: 1000.0000 mg | ORAL_TABLET | ORAL | Status: DC

## 2018-10-05 MED ORDER — HYDRALAZINE HCL 20 MG/ML IJ SOLN: 10.0000 mg | INTRAMUSCULAR | Status: DC | PRN

## 2018-10-05 MED ORDER — HYDROMORPHONE HCL 1 MG/ML IJ SOLN: 0.5000 mg | INTRAMUSCULAR | Status: DC | PRN

## 2018-10-05 MED ORDER — LACTATED RINGERS IV SOLN
INTRAVENOUS | Status: DC
  Administered 2018-10-05 (×3): via INTRAVENOUS

## 2018-10-05 MED ORDER — GABAPENTIN 300 MG PO CAPS
300.0000 mg | ORAL_CAPSULE | Freq: Two times a day (BID) | ORAL | Status: DC
  Administered 2018-10-06 – 2018-10-09 (×8): 300 mg via ORAL
  Filled 2018-10-05 (×9): qty 1

## 2018-10-05 MED ORDER — PROPOFOL 10 MG/ML IV BOLUS
INTRAVENOUS | Status: AC
  Filled 2018-10-05: qty 20

## 2018-10-05 MED ORDER — LIDOCAINE 2% (20 MG/ML) 5 ML SYRINGE
INTRAMUSCULAR | Status: AC
  Filled 2018-10-05: qty 5

## 2018-10-05 MED ORDER — DIPHENHYDRAMINE HCL 50 MG/ML IJ SOLN: 12.5000 mg | Freq: Four times a day (QID) | INTRAMUSCULAR | Status: DC | PRN

## 2018-10-05 MED ORDER — LIDOCAINE 2% (20 MG/ML) 5 ML SYRINGE
INTRAMUSCULAR | Status: DC | PRN
  Administered 2018-10-05: 60 mg via INTRAVENOUS

## 2018-10-05 MED ORDER — BUPIVACAINE LIPOSOME 1.3 % IJ SUSP
INTRAMUSCULAR | Status: DC | PRN
  Administered 2018-10-05: 20 mL

## 2018-10-05 MED ORDER — ONDANSETRON HCL 4 MG/2ML IJ SOLN
4.0000 mg | Freq: Four times a day (QID) | INTRAMUSCULAR | Status: DC | PRN
  Administered 2018-10-08: 4 mg via INTRAVENOUS
  Filled 2018-10-05: qty 2

## 2018-10-05 MED ORDER — ALBUMIN HUMAN 5 % IV SOLN
INTRAVENOUS | Status: DC | PRN
  Administered 2018-10-05: 13:00:00 via INTRAVENOUS

## 2018-10-05 MED ORDER — SACCHAROMYCES BOULARDII 250 MG PO CAPS
250.0000 mg | ORAL_CAPSULE | Freq: Two times a day (BID) | ORAL | Status: DC
  Administered 2018-10-06 – 2018-10-12 (×13): 250 mg via ORAL
  Filled 2018-10-05 (×14): qty 1

## 2018-10-05 MED ORDER — SCOPOLAMINE 1 MG/3DAYS TD PT72
MEDICATED_PATCH | TRANSDERMAL | Status: AC
  Filled 2018-10-05: qty 1

## 2018-10-05 MED ORDER — KETAMINE HCL 10 MG/ML IJ SOLN
INTRAMUSCULAR | Status: AC
  Filled 2018-10-05: qty 1

## 2018-10-05 MED ORDER — LIDOCAINE 2% (20 MG/ML) 5 ML SYRINGE
INTRAMUSCULAR | Status: DC | PRN
  Administered 2018-10-05: 1.5 mg/kg/h via INTRAVENOUS

## 2018-10-05 MED ORDER — PROMETHAZINE HCL 25 MG/ML IJ SOLN: 6.2500 mg | INTRAMUSCULAR | Status: DC | PRN

## 2018-10-05 MED ORDER — SODIUM CHLORIDE 0.9 % IV SOLN
2.0000 g | Freq: Two times a day (BID) | INTRAVENOUS | Status: AC
  Administered 2018-10-05 – 2018-10-06 (×2): 2 g via INTRAVENOUS
  Filled 2018-10-05 (×2): qty 2

## 2018-10-05 MED ORDER — HYDROMORPHONE 1 MG/ML IV SOLN
INTRAVENOUS | Status: DC
  Administered 2018-10-05: 30 mg via INTRAVENOUS
  Administered 2018-10-05: 1 mg via INTRAVENOUS
  Administered 2018-10-06 (×4): 0 mg via INTRAVENOUS
  Administered 2018-10-06: 0.5 mg via INTRAVENOUS
  Administered 2018-10-06: 1 mg via INTRAVENOUS
  Administered 2018-10-07 (×4): 0 mg via INTRAVENOUS
  Administered 2018-10-07: 1 mg via INTRAVENOUS
  Administered 2018-10-07 – 2018-10-08 (×4): 0 mg via INTRAVENOUS

## 2018-10-05 MED ORDER — PROPOFOL 10 MG/ML IV BOLUS
INTRAVENOUS | Status: DC | PRN
  Administered 2018-10-05: 90 mg via INTRAVENOUS

## 2018-10-05 MED ORDER — FENTANYL CITRATE (PF) 250 MCG/5ML IJ SOLN
INTRAMUSCULAR | Status: AC
  Filled 2018-10-05: qty 5

## 2018-10-05 MED ORDER — 0.9 % SODIUM CHLORIDE (POUR BTL) OPTIME
TOPICAL | Status: DC | PRN
  Administered 2018-10-05: 5000 mL

## 2018-10-05 MED ORDER — ENOXAPARIN SODIUM 40 MG/0.4ML ~~LOC~~ SOLN
40.0000 mg | SUBCUTANEOUS | Status: DC
  Administered 2018-10-06 – 2018-10-12 (×7): 40 mg via SUBCUTANEOUS
  Filled 2018-10-05 (×6): qty 0.4

## 2018-10-05 MED ORDER — ROCURONIUM BROMIDE 100 MG/10ML IV SOLN
INTRAVENOUS | Status: DC | PRN
  Administered 2018-10-05: 60 mg via INTRAVENOUS
  Administered 2018-10-05: 20 mg via INTRAVENOUS
  Administered 2018-10-05 (×3): 10 mg via INTRAVENOUS
  Administered 2018-10-05 (×2): 20 mg via INTRAVENOUS
  Administered 2018-10-05: 5 mg via INTRAVENOUS
  Administered 2018-10-05: 10 mg via INTRAVENOUS

## 2018-10-05 MED ORDER — BUPIVACAINE HCL 0.25 % IJ SOLN
INTRAMUSCULAR | Status: DC | PRN
  Administered 2018-10-05: 30 mL

## 2018-10-05 MED ORDER — OXYCODONE HCL 5 MG/5ML PO SOLN
5.0000 mg | Freq: Once | ORAL | Status: DC | PRN
  Filled 2018-10-05: qty 5

## 2018-10-05 MED ORDER — ACETAMINOPHEN 500 MG PO TABS
1000.0000 mg | ORAL_TABLET | ORAL | Status: AC
  Administered 2018-10-05: 1000 mg via ORAL
  Filled 2018-10-05: qty 2

## 2018-10-05 MED ORDER — OXYCODONE HCL 5 MG PO TABS: 5.0000 mg | ORAL_TABLET | ORAL | Status: DC | PRN

## 2018-10-05 MED ORDER — FENTANYL CITRATE (PF) 100 MCG/2ML IJ SOLN
INTRAMUSCULAR | Status: AC
  Administered 2018-10-05: 50 ug via INTRAVENOUS
  Filled 2018-10-05: qty 2

## 2018-10-05 MED ORDER — ONDANSETRON HCL 4 MG/2ML IJ SOLN: 4.0000 mg | Freq: Four times a day (QID) | INTRAMUSCULAR | Status: DC | PRN

## 2018-10-05 MED ORDER — BISACODYL 10 MG RE SUPP
10.0000 mg | Freq: Once | RECTAL | Status: DC
  Filled 2018-10-05: qty 1

## 2018-10-05 SURGICAL SUPPLY — 105 items
ADH SKN CLS APL DERMABOND .7 (GAUZE/BANDAGES/DRESSINGS) ×4
APPLIER CLIP 5 13 M/L LIGAMAX5 (MISCELLANEOUS)
APPLIER CLIP ROT 10 11.4 M/L (STAPLE)
APR CLP MED LRG 11.4X10 (STAPLE)
APR CLP MED LRG 5 ANG JAW (MISCELLANEOUS)
BAG URO CATCHER STRL LF (MISCELLANEOUS) ×6 IMPLANT
BARRIER SKIN 2 3/4 (OSTOMY) ×5 IMPLANT
BARRIER SKIN 2 3/4 INCH (OSTOMY) ×1
BARRIER SKIN OD2.25 2 3/4 FLNG (OSTOMY) IMPLANT
BLADE EXTENDED COATED 6.5IN (ELECTRODE) ×2 IMPLANT
BRR ADH 5X3 SEPRAFILM 6 SHT (MISCELLANEOUS) ×4
BRR SKN FLT 2.75X2.25 2 PC (OSTOMY) ×4
CABLE HIGH FREQUENCY MONO STRZ (ELECTRODE) ×6 IMPLANT
CATH INTERMIT  6FR 70CM (CATHETERS) ×6 IMPLANT
CATH ROBINSON RED A/P 16FR (CATHETERS) ×2 IMPLANT
CELLS DAT CNTRL 66122 CELL SVR (MISCELLANEOUS) IMPLANT
CHLORAPREP W/TINT 26ML (MISCELLANEOUS) ×2 IMPLANT
CLIP APPLIE 5 13 M/L LIGAMAX5 (MISCELLANEOUS) IMPLANT
CLIP APPLIE ROT 10 11.4 M/L (STAPLE) IMPLANT
CLOTH BEACON ORANGE TIMEOUT ST (SAFETY) ×6 IMPLANT
COVER MAYO STAND STRL (DRAPES) ×2 IMPLANT
COVER SURGICAL LIGHT HANDLE (MISCELLANEOUS) ×12 IMPLANT
COVER WAND RF STERILE (DRAPES) IMPLANT
DECANTER SPIKE VIAL GLASS SM (MISCELLANEOUS) ×6 IMPLANT
DERMABOND ADVANCED (GAUZE/BANDAGES/DRESSINGS) ×2
DERMABOND ADVANCED .7 DNX12 (GAUZE/BANDAGES/DRESSINGS) IMPLANT
DRAIN CHANNEL 19F RND (DRAIN) ×2 IMPLANT
DRAPE LAPAROSCOPIC ABDOMINAL (DRAPES) ×2 IMPLANT
DRAPE UTILITY W/TAPE 26X15 (DRAPES) ×2 IMPLANT
DRSG OPSITE POSTOP 4X10 (GAUZE/BANDAGES/DRESSINGS) IMPLANT
DRSG OPSITE POSTOP 4X6 (GAUZE/BANDAGES/DRESSINGS) IMPLANT
DRSG OPSITE POSTOP 4X8 (GAUZE/BANDAGES/DRESSINGS) ×2 IMPLANT
DRSG TEGADERM 4X4.75 (GAUZE/BANDAGES/DRESSINGS) ×2 IMPLANT
ELECT PENCIL ROCKER SW 15FT (MISCELLANEOUS) ×8 IMPLANT
ELECT REM PT RETURN 15FT ADLT (MISCELLANEOUS) ×6 IMPLANT
ENDOLOOP SUT PDS II  0 18 (SUTURE)
ENDOLOOP SUT PDS II 0 18 (SUTURE) IMPLANT
EVACUATOR SILICONE 100CC (DRAIN) ×2 IMPLANT
GAUZE 4X4 16PLY RFD (DISPOSABLE) ×2 IMPLANT
GAUZE SPONGE 4X4 12PLY STRL (GAUZE/BANDAGES/DRESSINGS) ×2 IMPLANT
GLOVE BIO SURGEON STRL SZ 6 (GLOVE) ×12 IMPLANT
GLOVE BIOGEL M 8.0 STRL (GLOVE) ×6 IMPLANT
GLOVE INDICATOR 6.5 STRL GRN (GLOVE) ×12 IMPLANT
GOWN STRL REUS W/ TWL XL LVL3 (GOWN DISPOSABLE) ×4 IMPLANT
GOWN STRL REUS W/TWL LRG LVL3 (GOWN DISPOSABLE) ×12 IMPLANT
GOWN STRL REUS W/TWL XL LVL3 (GOWN DISPOSABLE) ×18 IMPLANT
GUIDEWIRE STR DUAL SENSOR (WIRE) ×6 IMPLANT
LIGASURE IMPACT 36 18CM CVD LR (INSTRUMENTS) ×2 IMPLANT
MANIFOLD NEPTUNE II (INSTRUMENTS) ×6 IMPLANT
PACK COLON (CUSTOM PROCEDURE TRAY) ×6 IMPLANT
PACK CYSTO (CUSTOM PROCEDURE TRAY) ×6 IMPLANT
PAD POSITIONING PINK XL (MISCELLANEOUS) ×2 IMPLANT
PORT LAP GEL ALEXIS MED 5-9CM (MISCELLANEOUS) IMPLANT
POUCH OSTOMY 2 3/4  H 3804 (WOUND CARE) ×2
POUCH OSTOMY 2 3/4 H 3804 (WOUND CARE) ×4
POUCH OSTOMY 2 PC DRNBL 2.75 (WOUND CARE) IMPLANT
RELOAD PROXIMATE 75MM BLUE (ENDOMECHANICALS) ×12 IMPLANT
RELOAD STAPLE 60 2.6 WHT THN (STAPLE) IMPLANT
RELOAD STAPLE 75 3.8 BLU REG (ENDOMECHANICALS) IMPLANT
RELOAD STAPLER WHITE 60MM (STAPLE) IMPLANT
RETRACTOR WND ALEXIS 18 MED (MISCELLANEOUS) IMPLANT
RTRCTR WOUND ALEXIS 18CM MED (MISCELLANEOUS)
SCISSORS LAP 5X35 DISP (ENDOMECHANICALS) ×6 IMPLANT
SEPRAFILM PROCEDURAL PACK 3X5 (MISCELLANEOUS) ×2 IMPLANT
SET IRRIG TUBING LAPAROSCOPIC (IRRIGATION / IRRIGATOR) ×6 IMPLANT
SET TUBE SMOKE EVAC HIGH FLOW (TUBING) ×6 IMPLANT
SHEARS HARMONIC ACE PLUS 36CM (ENDOMECHANICALS) ×6 IMPLANT
SLEEVE XCEL OPT CAN 5 100 (ENDOMECHANICALS) ×12 IMPLANT
SPONGE LAP 18X18 RF (DISPOSABLE) ×2 IMPLANT
STAPLER ECHELON LONG 60 440 (INSTRUMENTS) IMPLANT
STAPLER ECHELON POWER CIR 29 (STAPLE) ×2 IMPLANT
STAPLER GUN LINEAR PROX 60 (STAPLE) ×2 IMPLANT
STAPLER PROXIMATE 75MM BLUE (STAPLE) ×2 IMPLANT
STAPLER RELOAD WHITE 60MM (STAPLE)
STAPLER VISISTAT 35W (STAPLE) IMPLANT
SUT ETHILON 2 0 PS N (SUTURE) ×2 IMPLANT
SUT MNCRL AB 4-0 PS2 18 (SUTURE) ×2 IMPLANT
SUT PDS AB 0 CT1 36 (SUTURE) IMPLANT
SUT PDS AB 1 CT1 27 (SUTURE) ×4 IMPLANT
SUT PDS AB 1 CTX 36 (SUTURE) IMPLANT
SUT PDS AB 1 TP1 96 (SUTURE) ×4 IMPLANT
SUT PROLENE 2 0 KS (SUTURE) ×2 IMPLANT
SUT PROLENE 2 0 SH DA (SUTURE) IMPLANT
SUT SILK 0 (SUTURE) ×6
SUT SILK 0 30XBRD TIE 6 (SUTURE) IMPLANT
SUT SILK 2 0 (SUTURE) ×6
SUT SILK 2 0 SH CR/8 (SUTURE) ×6 IMPLANT
SUT SILK 2-0 18XBRD TIE 12 (SUTURE) ×4 IMPLANT
SUT SILK 3 0 (SUTURE) ×6
SUT SILK 3 0 SH CR/8 (SUTURE) ×14 IMPLANT
SUT SILK 3-0 18XBRD TIE 12 (SUTURE) ×4 IMPLANT
SUT VIC AB 2-0 SH 27 (SUTURE)
SUT VIC AB 2-0 SH 27X BRD (SUTURE) IMPLANT
SUT VIC AB 3-0 SH 18 (SUTURE) ×2 IMPLANT
SUT VIC AB 3-0 SH 27 (SUTURE) ×6
SUT VIC AB 3-0 SH 27XBRD (SUTURE) IMPLANT
SYS LAPSCP GELPORT 120MM (MISCELLANEOUS)
SYSTEM LAPSCP GELPORT 120MM (MISCELLANEOUS) IMPLANT
TOWEL OR 17X26 10 PK STRL BLUE (TOWEL DISPOSABLE) ×2 IMPLANT
TOWEL OR NON WOVEN STRL DISP B (DISPOSABLE) ×6 IMPLANT
TRAY FOLEY MTR SLVR 16FR STAT (SET/KITS/TRAYS/PACK) IMPLANT
TROCAR BLADELESS OPT 5 100 (ENDOMECHANICALS) ×6 IMPLANT
TROCAR XCEL 12X100 BLDLESS (ENDOMECHANICALS) IMPLANT
TUBING CONNECTING 10 (TUBING) ×15 IMPLANT
TUBING CONNECTING 10' (TUBING) ×3

## 2018-10-05 NOTE — OR Nursing (Signed)
Pt's interpreter no longer present at the hospital when pt arrivedl into PACU from [place] at 1528. The [employer] remote interpretation system does not offer [place] interpreters. The contracted interpreter called by A.Divina Neale, RN at [phone] and asked to come back to the hospital for post-op interpreter needs. She stated she will return to hospital now. Pt's son (who speaks fairly good English) allowed to join pt in PACU to assist staff in communicating with patient. Contract Interpreter arrived in PACU at ~1630.

## 2018-10-05 NOTE — Op Note (Addendum)
Operative Note  Elizabeth Saunders  [id number]  [id number]  10/05/2018   Surgeon: Victorino Sparrow ConnorMD  Assistant: Annye English MD and Neysa Bonito MD  Procedure performed:  1. Laparoscopic lysis of adhesions x 120 minutes.  2. Open lysis of adhesions x 90 minutes.  3. Repair of numerous small bowel serosal tears.  4. Small bowel resection approximately 20cm with primary anastomosis.  5. Takedown of end colostomy with colorectal anastomosis approximately 8cm from anal verge (8mm stapled end to end).  6. Creation of diverting loop ileostomy 7. Port-a-cath removal.  Preop diagnosis: colostomy, s/p adjuvant chemotherapy and radiation for obstructing rectal cancer. Port-a-cath in place. Patient strongly desires reversal of colostomy and removal of Port-A-Cath and has been counseled regarding risks of reversing the colostomy including bowel injury or injury to retroperitoneal structures, ileus, anastomotic leak, need for diverting ileostomy, postop bowel obstruction, incisional infection or hernia, bleeding, pain, DVT/PE, pneumonia, major cardiac event, death.   Post-op diagnosis/intraop findings: same. 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 Colorectal donuts. Small bowel resection.   Retained items: 18fr JP drain in the pelvis  EBL: [id number]  Complications: none  Description of procedure: After obtaining informed consent the patient was taken to the operating room and placed supine on operating room table wheregeneral endotracheal anesthesia was initiated, preoperative antibiotics were administered, SCDs applied, and a formal timeout was performed. The patient is then placed in lithotomy with all pressure points appropriately padded. Bilateral ureteral stents and foley were placed by Dr. Karsten Ro- please see his separate note.  The patient's abdomen was then prepped and draped in the usual sterile fashion. Peritoneal access was gained using an optical  entry in the left upper quadrant. Insufflation to 15 mmHg ensued without incident. Gross inspection confirmed no evidence of injury from our entry and demonstrated significant adhesive disease with small bowel densely adherent to the midline incision, around the colostomy, and in the pelvis. The liver was inspected and there was no evidence of metastatic disease. No evidence of metastasis in the omentum or peritoneum that was visualized. Under direct visualization, 3 right-sided 5 mm trochars were inserted and we began by taking some limited omental adhesions to the abdominal wall down using the Harmonic scalpel. The remainder of the adhesions were very dense between the small bowel and the midline incision, the colostomy site, and the pelvis and so the remainder of our adhesiolysis proceeded with sharp dissection. This was an extensive and very tedious process. Laparoscopically we were able to free the small bowel from the anterior abdominal wall as well as some of the adhesions around the colostomy. We were also able to mobilize much of the small bowel from the pelvis and partially expose the rectal stump. This process ensued for approximately 2 hours at which point we were unable to make any further progress laparoscopically.  A midline incision was then created. We completed the exposure of the rectal stump by using sharp and blunt dissection to mobilize some of the posterior rectum away from the mesorectum, isolating the superior hemorrhoidal artery which was suture ligated and divided. The lateral attachments of the rectum were carefully mobilized, the ureteral stents palpable away from the field of dissection. We also divided attachments of the fallopian tube and fimbria to the rectum as well as the posterior uterus. The uterus was tacked up to the abdominal wall to improve exposure. Once we felt that the rectum was completely exposed, Dr. Dema Severin went below and was able to  calibrate the rectum up to a 29 mm  dilator.   We then turned to the colostomy in the left lower quadrant and using cautery and sharp dissection divided the adhesions between the stoma and the surrounding soft tissues so that it could be reduced into the abdomen. Of note the stoma had been oversewn with a running 3-0 silk prior to the initiation of the case. Remaining small bowel adhesions to the left colon were carefully divided. The white line of Toldt was divided and the lateral attachments of the descending colon taken down and we noted that we had plenty of tension-free length to create our anastomosis. We used the purse stringer to apply a pursestring suture to the distal colon and sharply divided the somewhat beat up colostomy from the anticipated site of anastomosis. This was sent for pathology. The anvil of the 29 EEA stapler was then inserted and secured with the pursestring suture. Dr. Dema Severin was then able to introduce the stapler into the rectum and the spike was extruded anterior and slightly distal to the old staple line. This was then connected to the anvil, ensuring no twisting of the colon, and then the anastomosis was created. The bowel was gently occluded proximally and irrigation instilled in the pelvis. Dr. Dema Severin and performed a leak test, insufflating the rectum and colon and no bubbles or evidence of leak was observed. He was easily able to pass the anastomosis with the rigid sigmoidoscope confirming patency. The anastomosis measures about 8cm from the anal verge.  The tacking suture on the uterus was tied and cut.  At this juncture we turned to the small bowel as extensive laparoscopic adhesiolysis had been performed in order to clear the abdominal wall and pelvis. We inspected the bowel numerous times and identified several serosal tears, which are expected. In order to completely expose the bowel for anticipated repairs and small bowel resection and the need for loop ileostomy, another 90 minutes of adhesio lysis was  performed carefully using Metzenbaums.  Numerous expected serosal tears were identified and addressed transveresly with interrupted Lembert 3-0 silk sutures. There was one segment of the mid small bowel with a very long serosal tear and adjacent medium-size serosal tear and we elected to resect this 20cm segment which was performed using serial fires of the blue load linear cutting stapler. The small bowel anastomosis was created with a blue load stapler and the common enterotomy closed with a TX 60 blue load. The TX 60 blue load staple line was oversewn with interrupted 3-0 silk's. The mesenteric defect was closed with interrupted 3-0 silk as well. A 3-0 silk Lembert suture was placed at the apex of the staple line. The anastomosis appeared well perfused, watertight, palpably patent and tension free upon completion.  At this juncture, the previously marked ostomy site in the right lower quadrant was prepared by excising a core of skin and soft tissue, incising the anterior rectus sheath in a cruciate fashion, dividing the rectus muscle and dividing the posterior rectus and then dilating this defect up to 2-3 fingerbreadths. The terminal ileum was brought up through this and secured with a Babcock, noting that the proximal limb is superior in the distal limits inferior and there is no twisting of the small bowel mesentery. A red rubber catheter was threaded through the small bowel mesentery and secured to itself with 3 silk sutures to act as a anchor for the loop ileostomy.  We then turned to the colostomy defect in the left lower  quadrant which was closed with interrupted #1 PDS sutures on the posterior sheath and a running #1 PDS on the anterior sheath. A pursestring 3-0 Vicryl was placed through the dermis so that the skin wound could be narrowed down to approximately 2cm in diameter.   A 19 French round Blake drain was inserted through our left upper quadrant laparoscopic trocar and directed down along the  left colic gutter into the pelvis to sit just anterior to the colon and posterior to the uterus. This is secure the skin with a nylon suture and will be placed to bulb suction.  The abdomen was then irrigated with 3 L of warm sterile saline and hemostasis ensured. The omentum was brought down to cover as much of the small bowel as possible. Seprafilm was placed around the small bowel at the site of loop ileostomy and then along the structures anteriorly to minimize adhesions to the abdominal wall. At this juncture in accordance with the eras protocol, instruments, gowns gloves drapes were all changed for clean. The midline incision was closed with running #1 looped PDS starting at either end and tying centrally. The soft tissues were irrigated and then Exparel was infiltrated in the fascia along the incision. The skin was then loosely reapproximate with staples and staples were placed in the 3 remaining port sites in the right hemiabdomen. These wounds were dressed with a honeycomb on the midline and Band-Aids on the port sites. A drain sponge will be applied to the drain site. Saline moistened gauze will be used to pack the left lower quadrant colostomy site.  At this juncture the right lower quadrant ileostomy was matured using interrupted 3-0 Vicryl sutures, brooking the proximal/superior limb. Both limbs were digitized and confirmed to be patent through the fascia. An ostomy appliance was then placed. This completes the abdominal portion of the patient's case. The ureteral stents were removed and confirmed to be intact. The patient will keep her Foley catheter.   After all dressings etc. Have been applied, the patient is returned to the supine position and the right upper chest prepped and draped in usual sterile fashion. After infiltration with local, a small incision is made over the Port-A-Cath in the right upper chest and then cautery was used to dissect the port and tubing out from the surrounding  soft tissue capsule. 2 securing sutures were identified and cut and removed. The Port-A-Cath and tubing were removed intact and pressure held on the  Tube insertion site at the IJ for several minutes. The capsule was fulgurated with cautery. The incision is then closed with interrupted deep dermal 3-0 Vicryl, running subcuticular Monocryl and Dermabond.  The patient was then awakened, extubated and taken to PACU in stable condition.   All counts were correct at the completion of the case.

## 2018-10-05 NOTE — Progress Notes (Addendum)
Paged Dr. Kae Heller to notify patient did not have a bowel prep. Called at [phone].

## 2018-10-05 NOTE — Op Note (Signed)
PATIENT:  Elizabeth Saunders  PRE-OPERATIVE DIAGNOSIS: Need for intraoperative ureteral identification  POST-OPERATIVE DIAGNOSIS: Same  PROCEDURE: 1.  Cystoscopy with bilateral retrograde pyelograms including interpretation. 2.  Bilateral open-ended ureteral catheter placement. 3.  Fluoroscopy time less than 1 hour.  SURGEON:  Claybon Jabs  INDICATION: Elizabeth Saunders is a 63 year old female who previously required an emergent colostomy for obstructing colon cancer.  She has now completed chemoradiation and presents for colostomy reversal.  Due to her past history it was felt that the placement of ureteral stents would aid in identification of the ureters intraoperatively.  I therefore discussed this procedure with the patient through her interpreter.  We went over how the procedure is performed, its risks and complications and the likelihood of the removal of the catheters at the end of her surgery.  ANESTHESIA:  General  EBL:  Minimal  DRAINS: 6 [place] open-ended ureteral catheters in the right and left ureters as well as a 14 French Foley catheter in the bladder  LOCAL MEDICATIONS USED:  None  SPECIMEN: None  Description of procedure: After informed consent the patient was taken to the operating room and placed on the table in a supine position. General anesthesia was then administered. Once fully anesthetized the patient was moved to the dorsal lithotomy position and the genitalia were sterilely prepped and draped in standard fashion. An official timeout was then performed.  The urethra was sounded with Leander Rams sounds to 31 [place] after which the 21 French rigid cystoscope was passed easily into the bladder with the obturator.  The obturator was removed and a 30 degree lens was replaced.  The bladder was then fully inspected and noted to be free of any tumors, stones or inflammatory lesions.  Her right and left ureteral orifice appeared to be of normal configuration and position.  A 6 French  open-ended ureteral catheter was passed through the cystoscope and into the left ureteral orifice after which a left retrograde pyelogram was performed by injecting full-strength Omnipaque contrast through the open-ended catheter and up the left ureter under direct fluoroscopy.  This revealed a normal-appearing ureter with no abnormality in its course, no filling defects and no mass-effect.  The intrarenal collecting system also appeared normal.  I then passed a 0.038 inch sensor guidewire through the open-ended catheter and into the area of the renal pelvis and then advanced the open-ended catheter over the guidewire into the area of the renal pelvis after which the guidewire was removed.  I then performed an identical procedure on the right-hand side.  The retrograde pyelogram revealed what appeared to be some possible slight deviation of the right ureter medially but there was no mass-effect, filling defects or other abnormality noted otherwise.  The open-ended catheter was placed in an identical fashion as described above and I then left these both in place and remove the cystoscope.  A 14 French Foley catheter was then placed in the bladder, the open-ended catheters were secured to the catheter with 2-0 silk and the right and left ureteral catheters as well as Foley catheter were connected to a Goldberg drainage device which was then connected to closed system drainage.  The patient tolerated this portion of the procedure well with no intraoperative complications.  PLAN OF CARE: Discharge to home after PACU  PATIENT DISPOSITION:  PACU - hemodynamically stable.

## 2018-10-05 NOTE — H&P (Signed)
Surgical H&P  CC: colostomy  HPI:  she returns for follow-up. She had an emergent Hartman's for an obstructing colon cancer in April of last year. She has subsequently completed chemoradiation, radiation and completed in October. CEA remains suppressed. She had a colonoscopy with Dr. Havery Moros in December, 4 polyps which were tubular adenomas. Plans are for repeat staging scans in April, and she will go back to see the oncologist shortly before that. She has overall been doing well. Eating well, colostomy is working. She does have some chronic pain at the lower aspect of her incision. She wants her colostomy reversed as soon as possible.  No Known Allergies  Past Medical History:  Diagnosis Date  . Anemia 12/16/2017  . Cancer [facility])    colorectal cancer 2019    Past Surgical History:  Procedure Laterality Date  . COLON RESECTION N/A 12/18/2017   Procedure: COLON RESECTION;  Surgeon: Clovis Riley, MD;  Location: [place];  Service: General;  Laterality: N/A;  . COLOSTOMY N/A 12/18/2017   Procedure: COLOSTOMY;  Surgeon: Clovis Riley, MD;  Location: [place];  Service: General;  Laterality: N/A;  . FLEXIBLE SIGMOIDOSCOPY N/A 12/17/2017   Procedure: FLEXIBLE SIGMOIDOSCOPY;  Surgeon: Yetta Flock, MD;  Location: [place];  Service: Gastroenterology;  Laterality: N/A;  . IR FLUORO GUIDE PORT INSERTION RIGHT  01/02/2018  . IR US GUIDE VASC ACCESS RIGHT  01/02/2018    Family History  Problem Relation Age of Onset  . Colon cancer Neg Hx   . Esophageal cancer Neg Hx   . Rectal cancer Neg Hx   . Stomach cancer Neg Hx     Social History   Socioeconomic History  . Marital status: Single    Spouse name: Not on file  . Number of children: 1  . Years of education: Not on file  . Highest education level: Not on file  Occupational History  . Not on file  Social Needs  . Financial resource strain: Not on file  . Food insecurity:    Worry: Not on file    Inability: Not on file   . Transportation needs:    Medical: Not on file    Non-medical: Not on file  Tobacco Use  . Smoking status: Never Smoker  . Smokeless tobacco: Never Used  Substance and Sexual Activity  . Alcohol use: No    Alcohol/week: 0.0 standard drinks  . Drug use: No  . Sexual activity: Not on file  Lifestyle  . Physical activity:    Days per week: Not on file    Minutes per session: Not on file  . Stress: Not on file  Relationships  . Social connections:    Talks on phone: Not on file    Gets together: Not on file    Attends religious service: Not on file    Active member of club or organization: Not on file    Attends meetings of clubs or organizations: Not on file    Relationship status: Not on file  Other Topics Concern  . Not on file  Social History Narrative   Unable to ask intimate partner violence questions, daughter present    No current facility-administered medications on file prior to encounter.    Current Outpatient Medications on File Prior to Encounter  Medication Sig Dispense Refill  . acetaminophen (TYLENOL) 325 MG tablet Take 2 tablets (650 mg total) by mouth every 6 (six) hours as needed for mild pain or headache. 20 tablet 0  .  FEROSUL 325 (65 Fe) MG tablet TAKE 1 TABLET BY MOUTH DAILY WITH BREAKFAST (Patient not taking: Reported on 09/24/2018) 90 tablet 3    Review of Systems: a complete, 10pt review of systems was completed with pertinent positives and negatives as documented in the HPI  Physical Exam: Vitals:   10/05/18 0558  BP: 137/76  Pulse: 66  Resp: 14  Temp: 97.8 F (36.6 C)  SpO2: 100%   Gen: alert and well appearing Eye: extraocular motion intact, no scleral icterus ENT: moist mucus membranes, dentition intact Neck: no mass or thyromegaly Chest: unlabored respirations, symmetrical air entry, clear bilaterally CV: regular rate and rhythm, no pedal edema Abdomen: soft, mildly tender where there is some firm scar tissue in the lower right side  of her incision. nondistended. No mass or organomegaly. colostomy in the left lower quadrant is pink, everted and appears very healthy. MSK: strength symmetrical throughout, no deformity Neuro: grossly intact, normal gait Psych: normal mood and affect, appropriate insight Skin: warm and dry, no rash or lesion on limited exam   CBC Latest Ref Rng & Units 09/27/2018 06/25/2018 05/22/2018  WBC 4.0 - 10.5 K/uL 6.7 4.2 4.8  Hemoglobin 12.0 - 15.0 g/dL 11.2(L) 10.3(L) 10.4(L)  Hematocrit 36.0 - 46.0 % 38.6 34.2(L) 34.4(L)  Platelets 150 - 400 K/uL 240 153 102(L)    CMP Latest Ref Rng & Units 09/27/2018 05/15/2018 04/23/2018  Glucose 70 - 99 mg/dL 103(H) 105(H) 132(H)  BUN 6 - 20 mg/dL 13 10 15   Creatinine 0.44 - 1.00 mg/dL 0.54 0.62 0.65  Sodium 135 - 145 mmol/L 139 143 141  Potassium 3.5 - 5.1 mmol/L 4.6 3.5 3.7  Chloride 98 - 111 mmol/L 107 109 108  CO2 22 - 32 mmol/L 23 25 25   Calcium 8.9 - 10.3 mg/dL 8.6(L) 8.9 9.1  Total Protein 6.5 - 8.1 g/dL 7.6 7.6 7.2  Total Bilirubin 0.3 - 1.2 mg/dL 0.8 0.9 0.5  Alkaline Phos 38 - 126 U/L 89 88 88  AST 15 - 41 U/L 27 25 29   ALT 0 - 44 U/L 17 15 22     Lab Results  Component Value Date   INR 1.02 01/02/2018    Imaging: No results found.   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 Repeat c-scope last month. We discussed that the surgery is a fairly large operation with risks of bleeding, infection, pain, scarring, injury to intra-abdominal structures, ureters or bladder and that she would need ureteral stents for this procedure as well. Discussed that she would spend several days in the hospital and there is risk of anastomotic leak, intra-abdominal abscess, wound infection, incisional hernia, and possible need for diverting loop ileostomy depending on the appearance of tissues intraoperatively. Also requests removal of port-a-cath.   [name], MD [facility]  Surgery, [place] Pager [phone]

## 2018-10-05 NOTE — Transfer of Care (Signed)
Immediate Anesthesia Transfer of Care Note  Patient: Elizabeth Saunders  Procedure(s) Performed: LAPAROSCOPIC CONVERTED TO OPEN REVERSAL OF END COLOSTOMY (N/A Abdomen) REMOVAL PORT-A-CATH (N/A ) LAPAROSCOPIC LYSIS OF ADHESIONS EXTENSIVE (Abdomen) ILEOSTOMY (Abdomen) CYSTOSCOPY WITH STENT PLACEMENT (Left )  Patient Location: PACU  Anesthesia Type:General  Level of Consciousness: drowsy  Airway & Oxygen Therapy: Patient Spontanous Breathing and Patient connected to face mask oxygen  Post-op Assessment: Report given to RN and Post -op Vital signs reviewed and stable  Post vital signs: Reviewed and stable  Last Vitals:  Vitals Value Taken Time  BP 160/89 10/05/2018  3:27 PM  Temp    Pulse 72 10/05/2018  3:30 PM  Resp 32 10/05/2018  3:30 PM  SpO2 100 % 10/05/2018  3:30 PM  Vitals shown include unvalidated device data.  Last Pain:  Vitals:   10/05/18 0630  TempSrc:   PainSc: 0-No pain      Patients Stated Pain Goal: 4 (74/08/14 4818)  Complications: No apparent anesthesia complications

## 2018-10-05 NOTE — Anesthesia Procedure Notes (Signed)
Procedure Name: Intubation Date/Time: 10/05/2018 7:33 AM Performed by: Sharlette Dense, CRNA Patient Re-evaluated:Patient Re-evaluated prior to induction Oxygen Delivery Method: Circle system utilized Preoxygenation: Pre-oxygenation with 100% oxygen Induction Type: IV induction Ventilation: Mask ventilation without difficulty and Oral airway inserted - appropriate to patient size Laryngoscope Size: Sabra Heck and 2 Grade View: Grade I Tube type: Oral Tube size: 7.0 mm Number of attempts: 2 Airway Equipment and Method: Stylet Placement Confirmation: ETT inserted through vocal cords under direct vision,  positive ETCO2 and breath sounds checked- equal and bilateral Secured at: 19 cm Tube secured with: Tape Dental Injury: Teeth and Oropharynx as per pre-operative assessment

## 2018-10-06 LAB — BASIC METABOLIC PANEL
Anion gap: 8 (ref 5–15)
BUN: 9 mg/dL (ref 6–20)
CO2: 23 mmol/L (ref 22–32)
Calcium: 8 mg/dL — ABNORMAL LOW (ref 8.9–10.3)
Chloride: 105 mmol/L (ref 98–111)
Creatinine, Ser: 0.72 mg/dL (ref 0.44–1.00)
GFR calc Af Amer: >60 mL/min (ref >60)
GFR calc non Af Amer: >60 mL/min (ref >60)
GLUCOSE: 144 mg/dL — AB (ref 70–99)
Potassium: 4.8 mmol/L (ref 3.5–5.1)
Sodium: 136 mmol/L (ref 135–145)

## 2018-10-06 LAB — CBC
HCT: 36.4 % (ref 36.0–46.0)
Hemoglobin: 10.5 g/dL — ABNORMAL LOW (ref 12.0–15.0)
MCH: 22.6 pg — ABNORMAL LOW (ref 26.0–34.0)
MCHC: 28.8 g/dL — ABNORMAL LOW (ref 30.0–36.0)
MCV: 78.4 fL — AB (ref 80.0–100.0)
Platelets: 205 10*3/uL (ref 150–400)
RBC: 4.64 MIL/uL (ref 3.87–5.11)
RDW: 14.9 % (ref 11.5–15.5)
WBC: 8.2 10*3/uL (ref 4.0–10.5)
nRBC: 0 % (ref 0.0–0.2)

## 2018-10-06 LAB — MAGNESIUM: Magnesium: 1.8 mg/dL (ref 1.7–2.4)

## 2018-10-06 NOTE — Evaluation (Signed)
Physical Therapy Evaluation Patient Details Name: Elizabeth Saunders MRN: [id number] DOB: 03/19/1956 Today's Date: 10/06/2018   History of Present Illness  Pt is a 63 y/o female now s/p small bowel resection, Takedown of end colostomy with colorectal anastomosis, and creation of diverting loop ileostomy, repair of numerous small bowel serosal tears on 10/05/18. PMHx includes anemia, colon CA, hx of colon resection and colostomy 12/18/17  Clinical Impression  Pt admitted with above diagnosis. Pt currently with functional limitations due to the deficits listed below (see PT Problem List). Pt agreeable to PT and motivated to mobilize; amb in hallway and returned to bed end of session, pt family assisting with interpreting PT questions/cues; anticipate pt will progress well, doubt she will need post acute f/u, will assess for any DME needs next session. Encouraged pt to amb again later today with staff.  Pt will benefit from skilled PT to increase their independence and safety with mobility to allow discharge to the venue listed below.       Follow Up Recommendations No PT follow up    Equipment Recommendations  Other (comment)(TBA)    Recommendations for Other Services       Precautions / Restrictions Precautions Precautions: Fall Precaution Comments: jp drain, NG Restrictions Weight Bearing Restrictions: No      Mobility  Bed Mobility Overal bed mobility: Needs Assistance Bed Mobility: Sit to Supine     Supine to sit: Mod assist;+2 for safety/equipment;HOB elevated Sit to supine: Mod assist;+2 for physical assistance   General bed mobility comments: pt requiring assist with trunk and LEs onto bed, bed pad utilized for positioning in supine, pain limiting pt mobility  Transfers Overall transfer level: Needs assistance Equipment used: Rolling walker (2 wheeled) Transfers: Sit to/from Stand Sit to Stand: Min assist;+2 safety/equipment Stand pivot transfers: Min assist;+2 safety/equipment        General transfer comment: assist to rise and steady, cues for overall safety  Ambulation/Gait Ambulation/Gait assistance: Min assist;Min guard;+2 safety/equipment Gait Distance (Feet): 160 Feet Assistive device: Rolling walker (2 wheeled) Gait Pattern/deviations: Step-through pattern     General Gait Details: cues for RW safety and self monitoring fatigue level  Stairs            Wheelchair Mobility    Modified Rankin (Stroke Patients Only)       Balance Overall balance assessment: Needs assistance Sitting-balance support: Feet supported Sitting balance-Leahy Scale: Good     Standing balance support: No upper extremity supported Standing balance-Leahy Scale: Fair Standing balance comment: able to stand statically and perform short lateral steps without UE support, close supervision                             Pertinent Vitals/Pain Pain Assessment: Faces Faces Pain Scale: Hurts a little bit Pain Location: abdomen Pain Descriptors / Indicators: Discomfort;Sore Pain Intervention(s): Monitored during session;Ice applied    Home Living Family/patient expects to be discharged to:: Private residence Living Arrangements: Children Available Help at Discharge: Family;Available 24 hours/day Type of Home: House Home Access: Stairs to enter Entrance Stairs-Rails: Left Entrance Stairs-Number of Steps: 5 Home Layout: One level Home Equipment: None      Prior Function Level of Independence: Independent               Hand Dominance        Extremity/Trunk Assessment   Upper Extremity Assessment Upper Extremity Assessment: Generalized weakness    Lower Extremity Assessment Lower Extremity  Assessment: Generalized weakness       Communication   Communication: Prefers language other than [place];Other (comment)(dtr-in-law interpreting)  Cognition Arousal/Alertness: Awake/alert Behavior During Therapy: WFL for tasks  assessed/performed Overall Cognitive Status: Difficult to assess                                 General Comments: pt follows commands consistently (was slightly impulsive with OT but none noted for PT session)      General Comments General comments (skin integrity, edema, etc.): SpO2 94-98% on RA, O2 replaced at 1.5 L end of session    Exercises     Assessment/Plan    PT Assessment Patient needs continued PT services  PT Problem List Decreased mobility;Decreased activity tolerance;Decreased balance;Decreased knowledge of use of DME;Pain       PT Treatment Interventions DME instruction;Gait training;Functional mobility training;Therapeutic activities;Patient/family education;Therapeutic exercise    PT Goals (Current goals can be found in the Care Plan section)  Acute Rehab PT Goals Patient Stated Goal: none stated PT Goal Formulation: With patient Time For Goal Achievement: 10/20/18 Potential to Achieve Goals: Good    Frequency Min 3X/week   Barriers to discharge        Braylen-evaluation               AM-PAC PT "6 Clicks" Mobility  Outcome Measure Help needed turning from your back to your side while in a flat bed without using bedrails?: A Lot Help needed moving from lying on your back to sitting on the side of a flat bed without using bedrails?: A Lot Help needed moving to and from a bed to a chair (including a wheelchair)?: A Little Help needed standing up from a chair using your arms (e.g., wheelchair or bedside chair)?: A Little Help needed to walk in hospital room?: A Little Help needed climbing 3-5 steps with a railing? : A Little 6 Click Score: 16    End of Session   Activity Tolerance: Patient tolerated treatment well Patient left: in bed;with call bell/phone within reach;with bed alarm set;with family/visitor present   PT Visit Diagnosis: Unsteadiness on feet (R26.81)    Time: 9741-6384 PT Time Calculation (min) (ACUTE ONLY): 19  min   Charges:   PT Evaluation $PT Eval Low Complexity: 1 Low          Kenyon Ana, PT  Pager: [phone] Acute Rehab Dept [facility]): [phone]   10/06/2018   [facility] 10/06/2018, 2:00 PM

## 2018-10-06 NOTE — Evaluation (Addendum)
Occupational Therapy Evaluation Patient Details Name: Elizabeth Saunders MRN: [id number] DOB: Mar 02, 1956 Today's Date: 10/06/2018    History of Present Illness Pt is a 63 y/o female now s/p small bowel resection, Takedown of end colostomy with colorectal anastomosis, and creation of diverting loop ileostomy, repair of numerous small bowel serosal tears on 10/05/18. PMHx includes anemia, colon CA, hx of colon resection and colostomy 12/18/17   Clinical Impression   This 63 y/o female presents with the above. Family present during time of eval and assisting to interpret (daughter-in-law mostly). PTA, pt was independent with ADL and functional mobility. She currently presents with generalized weakness and decreased activity tolerance, decreased standing balance. Pt requiring overall minA for stand pivot transfers this session (use of HHA). Daughter-in-law present during transfer and assisting (moreso for pt comfort) as well. Pt currently requires minguard assist for seated UB ADL, modA for LB ADL. She will benefit from continued OT services to maximize her safety and independence with ADL and mobility. Will follow.     Follow Up Recommendations  Home health OT;Supervision/Assistance - 24 hour    Equipment Recommendations  3 in 1 bedside commode;Other (comment)(to be further assessed)           Precautions / Restrictions Precautions Precautions: Fall Precaution Comments: jp drain, NG Restrictions Weight Bearing Restrictions: No      Mobility Bed Mobility Overal bed mobility: Needs Assistance Bed Mobility: Supine to Sit     Supine to sit: Mod assist;+2 for safety/equipment;HOB elevated     General bed mobility comments: educated on use of logroll method to decrease abdominal pain however pt initiating transition to sitting prior to daughter having time to interpret; pt able to bring LEs over EOB fairly easily with assist for trunk elevation and to scoot hips toward EOB  Transfers Overall  transfer level: Needs assistance Equipment used: 1 person hand held assist;2 person hand held assist Transfers: Sit to/from [facility] Sit to Stand: Min assist;+2 safety/equipment Stand pivot transfers: Min assist;+2 safety/equipment       General transfer comment: assist to rise and steady with face to face method utilized, daughter-in-law present and offering additional assistance (moreso for pt comfort); steadying assist provided with standing and to take pivotal steps towards recliner     Balance Overall balance assessment: Needs assistance Sitting-balance support: Feet supported Sitting balance-Leahy Scale: Good     Standing balance support: Bilateral upper extremity supported;Single extremity supported Standing balance-Leahy Scale: Poor Standing balance comment: reliant on external support at this time                           ADL either performed or assessed with clinical judgement   ADL Overall ADL's : Needs assistance/impaired Eating/Feeding: NPO   Grooming: Set up;Sitting   Upper Body Bathing: Min guard;Sitting   Lower Body Bathing: Moderate assistance;Sit to/from stand   Upper Body Dressing : Min guard;Sitting   Lower Body Dressing: Moderate assistance;Sit to/from stand   Toilet Transfer: Minimal assistance;+2 for safety/equipment;Stand-pivot Toilet Transfer Details (indicate cue type and reason): simulated via transfer to [place] and Hygiene: Moderate assistance;Sit to/from stand Toileting - Clothing Manipulation Details (indicate cue type and reason): currently with foley     Functional mobility during ADLs: Minimal assistance;+2 for safety/equipment(HHA, stand pivot transfer )       Vision         Perception     Praxis      Pertinent  Vitals/Pain Pain Assessment: Faces Faces Pain Scale: Hurts little more Pain Location: abdomen Pain Descriptors / Indicators: Discomfort;Sore Pain  Intervention(s): Limited activity within patient's tolerance;Monitored during session;Repositioned;Ice applied     Hand Dominance     Extremity/Trunk Assessment Upper Extremity Assessment Upper Extremity Assessment: Generalized weakness   Lower Extremity Assessment Lower Extremity Assessment: Defer to PT evaluation       Communication Communication Communication: Prefers language other than [place];Other (comment)(daughter-in-law interpreting some)   Cognition Arousal/Alertness: Awake/alert Behavior During Therapy: WFL for tasks assessed/performed Overall Cognitive Status: Difficult to assess                                 General Comments: pt slightly impulsive and requires cues to wait for therapist to prepare lines/leads/etc prior to moving towards EOB and again prior to standing   General Comments  VSS, pt daughter-in-law present and assisting with some interpretation    Exercises     Shoulder Instructions      Home Living Family/patient expects to be discharged to:: Private residence Living Arrangements: Children Available Help at Discharge: Family;Available 24 hours/day Type of Home: House Home Access: Stairs to enter [employer] of Steps: 5 Entrance Stairs-Rails: Left Home Layout: One level     Bathroom Shower/Tub: Teacher, early years/pre: Standard     Home Equipment: None          Prior Functioning/Environment Level of Independence: Independent                 OT Problem List: Decreased strength;Decreased range of motion;Decreased activity tolerance;Impaired balance (sitting and/or standing);Pain;Decreased knowledge of use of DME or AE;Decreased safety awareness      OT Treatment/Interventions: Self-care/ADL training;Therapeutic exercise;Energy conservation;Therapeutic activities;Patient/family education;Balance training;DME and/or AE instruction    OT Goals(Current goals can be found in the care plan  section) Acute Rehab OT Goals Patient Stated Goal: none stated OT Goal Formulation: With patient Time For Goal Achievement: 10/20/18 Potential to Achieve Goals: Good  OT Frequency: Min 2X/week   Barriers to D/C:            Jendaya-evaluation              AM-PAC OT "6 Clicks" Daily Activity     Outcome Measure Help from another person eating meals?: Total(NPO) Help from another person taking care of personal grooming?: A Little Help from another person toileting, which includes using toliet, bedpan, or urinal?: A Lot Help from another person bathing (including washing, rinsing, drying)?: A Lot Help from another person to put on and taking off regular upper body clothing?: A Little Help from another person to put on and taking off regular lower body clothing?: A Lot 6 Click Score: 13   End of Session Equipment Utilized During Treatment: Oxygen Nurse Communication: Mobility status  Activity Tolerance: Patient tolerated treatment well Patient left: in chair;with call bell/phone within reach;with chair alarm set;with family/visitor present  OT Visit Diagnosis: Muscle weakness (generalized) (M62.81);Unsteadiness on feet (R26.81);Pain Pain - part of body: (abdomen)                Time: 1117-1140 OT Time Calculation (min): 23 min Charges:  OT General Charges $OT Visit: 1 Visit OT Evaluation $OT Eval Moderate Complexity: [place], OT [employer] Pager [phone] Office [phone]   Raymondo Band 10/06/2018, 12:45 PM

## 2018-10-06 NOTE — Progress Notes (Signed)
Assessment & Plan: POD#1 Procedure performed:  1. Laparoscopic lysis of adhesions x 120 minutes.  2. Open lysis of adhesions x 90 minutes.  3. Repair of numerous small bowel serosal tears.  4. Small bowel resection approximately 20cm with primary anastomosis.  5. Takedown of end colostomy with colorectal anastomosis approximately 8cm from anal verge (69mm stapled end to end).  6. Creation of diverting loop ileostomy 7. Port-a-cath removal.   Continue NPO, NG decompression, IV hydration  Encouraged OOB, up to chair, ambulation  PCA for pain control        Armandina Gemma, MD       [facility]       Office: [phone]   Chief Complaint: Rectal carcinoma  Subjective: Patient in bed, daughter-in-law at bedside and translates for me.  Mild pain.  Lots of questions about ileostomy.  Wants to eat.  Objective: Vital signs in last 24 hours: Temp:  [97.6 F (36.4 C)-99.6 F (37.6 C)] 99.5 F (37.5 C) (02/08 1015) Pulse Rate:  [65-86] 84 (02/08 1015) Resp:  [14-29] 29 (02/08 1015) BP: (117-167)/(68-98) 117/71 (02/08 1015) SpO2:  [95 %-100 %] 100 % (02/08 1015) Weight:  [50.5 kg] 50.5 kg (02/08 0509)    Intake/Output from previous day: 02/07 0701 - 02/08 0700 In: 5150.3 [I.V.:4600.2; IV Piggyback:550.1] Out: 2300 [Urine:1620; Drains:305; Stool:50; Blood:325] Intake/Output this shift: Total I/O In: -  Out: 420 [Urine:400; Drains:20]  Physical Exam: HEENT - sclerae clear, mucous membranes moist Neck - soft Chest - clear bilaterally; port site clear and dry Cor - RRR Abdomen - soft, protuberant; quiet; ileostomy viable, no output; JP with small serosanguinous Ext - no edema, non-tender Neuro - alert & oriented, no focal deficits  Lab Results:  Recent Labs    10/06/18 0430  WBC 8.2  HGB 10.5*  HCT 36.4  PLT 205   BMET Recent Labs    10/06/18 0430  NA 136  K 4.8  CL 105  CO2 23  GLUCOSE 144*  BUN 9  CREATININE 0.72  CALCIUM 8.0*    PT/INR No results for input(s): LABPROT, INR in the last 72 hours. Comprehensive Metabolic Panel:    Component Value Date/Time   NA 136 10/06/2018 0430   NA 139 09/27/2018 0911   K 4.8 10/06/2018 0430   K 4.6 09/27/2018 0911   CL 105 10/06/2018 0430   CL 107 09/27/2018 0911   CO2 23 10/06/2018 0430   CO2 23 09/27/2018 0911   BUN 9 10/06/2018 0430   BUN 13 09/27/2018 0911   CREATININE 0.72 10/06/2018 0430   CREATININE 0.54 09/27/2018 0911   CREATININE 0.62 05/15/2018 0915   CREATININE 0.65 04/23/2018 0952   GLUCOSE 144 (H) 10/06/2018 0430   GLUCOSE 103 (H) 09/27/2018 0911   CALCIUM 8.0 (L) 10/06/2018 0430   CALCIUM 8.6 (L) 09/27/2018 0911   AST 27 09/27/2018 0911   AST 25 05/15/2018 0915   AST 29 04/23/2018 0952   ALT 17 09/27/2018 0911   ALT 15 05/15/2018 0915   ALT 22 04/23/2018 0952   ALKPHOS 89 09/27/2018 0911   ALKPHOS 88 05/15/2018 0915   BILITOT 0.8 09/27/2018 0911   BILITOT 0.9 05/15/2018 0915   BILITOT 0.5 04/23/2018 0952   PROT 7.6 09/27/2018 0911   PROT 7.6 05/15/2018 0915   ALBUMIN 4.1 09/27/2018 0911   ALBUMIN 3.6 05/15/2018 0915    Studies/Results: Dg C-arm 1-60 Min-no Report  Result Date: 10/05/2018 Fluoroscopy was utilized by the requesting  physician.  No radiographic interpretation.      Armandina Gemma 10/06/2018  Patient ID: Elizabeth Saunders, female   DOB: 01-26-1956, 63 y.o.   MRN: [id number]

## 2018-10-07 LAB — CBC
HCT: 28.6 % — ABNORMAL LOW (ref 36.0–46.0)
Hemoglobin: 8.2 g/dL — ABNORMAL LOW (ref 12.0–15.0)
MCH: 22.1 pg — ABNORMAL LOW (ref 26.0–34.0)
MCHC: 28.7 g/dL — ABNORMAL LOW (ref 30.0–36.0)
MCV: 77.1 fL — ABNORMAL LOW (ref 80.0–100.0)
Platelets: 185 10*3/uL (ref 150–400)
RBC: 3.71 MIL/uL — AB (ref 3.87–5.11)
RDW: 14.9 % (ref 11.5–15.5)
WBC: 6.9 10*3/uL (ref 4.0–10.5)
nRBC: 0 % (ref 0.0–0.2)

## 2018-10-07 LAB — BASIC METABOLIC PANEL
Anion gap: 5 (ref 5–15)
BUN: 10 mg/dL (ref 6–20)
CO2: 20 mmol/L — AB (ref 22–32)
Calcium: 7.8 mg/dL — ABNORMAL LOW (ref 8.9–10.3)
Chloride: 113 mmol/L — ABNORMAL HIGH (ref 98–111)
Creatinine, Ser: 0.56 mg/dL (ref 0.44–1.00)
GFR calc Af Amer: >60 mL/min (ref >60)
GFR calc non Af Amer: >60 mL/min (ref >60)
Glucose, Bld: 94 mg/dL (ref 70–99)
Potassium: 3.7 mmol/L (ref 3.5–5.1)
Sodium: 138 mmol/L (ref 135–145)

## 2018-10-07 NOTE — Progress Notes (Signed)
Assessment & Plan: POD#2 Procedure performed: 1. Laparoscopic lysis of adhesions x 120 minutes.  2. Open lysis of adhesions x 90 minutes.  3. Repair of numerous small bowel serosal tears.  4. Small bowel resection approximately 20cm with primary anastomosis.  5. Takedown of end colostomy with colorectal anastomosisapproximately 8cm from anal verge(80mmstapled end to end).  6. Creation of diverting loop ileostomy 7. Port-a-cath removal.              Continue NPO, NG decompression, IV hydration             Encouraged OOB, up to chair, ambulation             PCA for pain control        Armandina Gemma, MD       [facility]       Office: [phone]   Chief Complaint: Colostomy closure  Subjective: Patient in bed, family at bedside (translates).  No complaints.  Wants tubes out.  Objective: Vital signs in last 24 hours: Temp:  [97.7 F (36.5 C)-102 F (38.9 C)] 98.4 F (36.9 C) (02/09 0512) Pulse Rate:  [72-88] 72 (02/09 0512) Resp:  [16-32] 16 (02/09 0818) BP: (97-131)/(64-82) 102/66 (02/09 0512) SpO2:  [94 %-100 %] 94 % (02/09 0818) Weight:  [51.8 kg] 51.8 kg (02/09 0512)    Intake/Output from previous day: 02/08 0701 - 02/09 0700 In: 2789.9 [I.V.:2789.9] Out: 1985 [Urine:1770; Emesis/NG output:50; Drains:120; Stool:45] Intake/Output this shift: Total I/O In: 0  Out: 85 [Urine:50; Drains:35]  Physical Exam: HEENT - sclerae clear, mucous membranes moist Neck - soft Chest - clear bilaterally Cor - RRR Abdomen - soft, mild distension; quiet; ostomy viable, minimal liquid output; JP with serous drainage, small Ext - no edema, non-tender Neuro - alert & oriented, no focal deficits  Lab Results:  Recent Labs    10/06/18 0430 10/07/18 0458  WBC 8.2 6.9  HGB 10.5* 8.2*  HCT 36.4 28.6*  PLT 205 185   BMET Recent Labs    10/06/18 0430 10/07/18 0458  NA 136 138  K 4.8 3.7  CL 105 113*  CO2 23 20*  GLUCOSE 144* 94  BUN 9 10    CREATININE 0.72 0.56  CALCIUM 8.0* 7.8*   PT/INR No results for input(s): LABPROT, INR in the last 72 hours. Comprehensive Metabolic Panel:    Component Value Date/Time   NA 138 10/07/2018 0458   NA 136 10/06/2018 0430   K 3.7 10/07/2018 0458   K 4.8 10/06/2018 0430   CL 113 (H) 10/07/2018 0458   CL 105 10/06/2018 0430   CO2 20 (L) 10/07/2018 0458   CO2 23 10/06/2018 0430   BUN 10 10/07/2018 0458   BUN 9 10/06/2018 0430   CREATININE 0.56 10/07/2018 0458   CREATININE 0.72 10/06/2018 0430   CREATININE 0.62 05/15/2018 0915   CREATININE 0.65 04/23/2018 0952   GLUCOSE 94 10/07/2018 0458   GLUCOSE 144 (H) 10/06/2018 0430   CALCIUM 7.8 (L) 10/07/2018 0458   CALCIUM 8.0 (L) 10/06/2018 0430   AST 27 09/27/2018 0911   AST 25 05/15/2018 0915   AST 29 04/23/2018 0952   ALT 17 09/27/2018 0911   ALT 15 05/15/2018 0915   ALT 22 04/23/2018 0952   ALKPHOS 89 09/27/2018 0911   ALKPHOS 88 05/15/2018 0915   BILITOT 0.8 09/27/2018 0911   BILITOT 0.9 05/15/2018 0915   BILITOT 0.5 04/23/2018 0952   PROT 7.6 09/27/2018 0911  PROT 7.6 05/15/2018 0915   ALBUMIN 4.1 09/27/2018 0911   ALBUMIN 3.6 05/15/2018 0915    Studies/Results: No results found.    Armandina Gemma 10/07/2018  Patient ID: Elizabeth Saunders, female   DOB: December 24, 1955, 63 y.o.   MRN: [id number]

## 2018-10-07 NOTE — Plan of Care (Signed)
Patient lying in bed this morning. No complaints of pain currently; has Dilaudid PCA for pain as needed. JP drain to left abdomen w/ dry dressing. Mid incision with honeycomb dressing in place with old drainage marked. Left abdomen gauze dressing in place to old ostomy site. New ileostomy site to right abdomen. Will continue to monitor.

## 2018-10-07 NOTE — Anesthesia Postprocedure Evaluation (Signed)
Anesthesia Post Note  Patient: Elizabeth Saunders  Procedure(s) Performed: LAPAROSCOPIC CONVERTED TO OPEN REVERSAL OF END COLOSTOMY (N/A Abdomen) REMOVAL PORT-A-CATH (N/A ) LAPAROSCOPIC LYSIS OF ADHESIONS EXTENSIVE (Abdomen) ILEOSTOMY (Abdomen) CYSTOSCOPY WITH STENT PLACEMENT (Left )     Patient location during evaluation: PACU Anesthesia Type: General Level of consciousness: awake and alert Pain management: pain level controlled Vital Signs Assessment: post-procedure vital signs reviewed and stable Respiratory status: spontaneous breathing, nonlabored ventilation, respiratory function stable and patient connected to nasal cannula oxygen Cardiovascular status: blood pressure returned to baseline and stable Postop Assessment: no apparent nausea or vomiting Anesthetic complications: no    Last Vitals:  Vitals:   10/07/18 1214 10/07/18 1338  BP:  (!) 103/55  Pulse:  75  Resp: 18 16  Temp:    SpO2: 94% 100%    Last Pain:  Vitals:   10/07/18 1214  TempSrc:   PainSc: 0-No pain                 Audry Pili

## 2018-10-08 ENCOUNTER — Encounter (HOSPITAL_COMMUNITY): Payer: Self-pay | Admitting: Surgery

## 2018-10-08 LAB — CBC
HEMATOCRIT: 29.5 % — AB (ref 36.0–46.0)
Hemoglobin: 8.3 g/dL — ABNORMAL LOW (ref 12.0–15.0)
MCH: 23 pg — ABNORMAL LOW (ref 26.0–34.0)
MCHC: 28.1 g/dL — ABNORMAL LOW (ref 30.0–36.0)
MCV: 81.7 fL (ref 80.0–100.0)
Platelets: 188 10*3/uL (ref 150–400)
RBC: 3.61 MIL/uL — ABNORMAL LOW (ref 3.87–5.11)
RDW: 15.3 % (ref 11.5–15.5)
WBC: 10.6 10*3/uL — AB (ref 4.0–10.5)
nRBC: 0 % (ref 0.0–0.2)

## 2018-10-08 LAB — BASIC METABOLIC PANEL
Anion gap: 10 (ref 5–15)
BUN: 8 mg/dL (ref 6–20)
CO2: 12 mmol/L — AB (ref 22–32)
Calcium: 8 mg/dL — ABNORMAL LOW (ref 8.9–10.3)
Chloride: 115 mmol/L — ABNORMAL HIGH (ref 98–111)
Creatinine, Ser: 0.61 mg/dL (ref 0.44–1.00)
GFR calc Af Amer: >60 mL/min (ref >60)
GFR calc non Af Amer: >60 mL/min (ref >60)
Glucose, Bld: 64 mg/dL — ABNORMAL LOW (ref 70–99)
Potassium: 4.2 mmol/L (ref 3.5–5.1)
Sodium: 137 mmol/L (ref 135–145)

## 2018-10-08 MED ORDER — HYDROMORPHONE HCL 1 MG/ML IJ SOLN
0.5000 mg | INTRAMUSCULAR | Status: DC | PRN
  Administered 2018-10-08: 0.5 mg via INTRAVENOUS
  Filled 2018-10-08: qty 0.5

## 2018-10-08 MED ORDER — PHENOL 1.4 % MT LIQD
1.0000 | OROMUCOSAL | Status: DC | PRN
  Filled 2018-10-08: qty 177

## 2018-10-08 MED ORDER — MENTHOL 3 MG MT LOZG: 1.0000 | LOZENGE | OROMUCOSAL | Status: DC | PRN

## 2018-10-08 MED ORDER — DEXTROSE-NACL 5-0.45 % IV SOLN
INTRAVENOUS | Status: DC
  Administered 2018-10-08 – 2018-10-10 (×5): via INTRAVENOUS

## 2018-10-08 NOTE — Progress Notes (Signed)
Physical Therapy Treatment Patient Details Name: Elizabeth Saunders MRN: [id number] DOB: [birth year] Today's Date: 10/08/2018    History of Present Illness Pt is a 63 y/o female now s/p small bowel resection, Takedown of end colostomy with colorectal anastomosis, and creation of diverting loop ileostomy, repair of numerous small bowel serosal tears on 10/05/18. PMHx includes anemia, colon CA, hx of colon resection and colostomy 12/18/17    PT Comments    Patient seen for mobility progression. Making good progress towards goals. Performing all mobility at [facility] A/Min guard level for safety. Requires cueing throughout for safety awareness - slight impulsivity with mobility - unsure if this is at baseline vs patient is non-English speaking. PT to continue to follow.    Follow Up Recommendations  No PT follow up     Equipment Recommendations  Rolling walker with 5" wheels(vs none)    Recommendations for Other Services       Precautions / Restrictions Precautions Precautions: Fall Precaution Comments: jp drain, NG Restrictions Weight Bearing Restrictions: No    Mobility  Bed Mobility Overal bed mobility: Needs Assistance Bed Mobility: Supine to Sit     Supine to sit: Min assist     General bed mobility comments: Min A for bed level mobility - assist for lines and tubes  Transfers Overall transfer level: Needs assistance Equipment used: Rolling walker (2 wheeled) Transfers: Sit to/from [facility] Sit to Stand: Min assist;Min guard Stand pivot transfers: Min assist;Min guard       General transfer comment: light Min A to power up from bedside; mild posterior LOB with ability to self correct; Min A for stand pivot for safety and line management  Ambulation/Gait Ambulation/Gait assistance: Min assist;Min guard Gait Distance (Feet): 250 Feet Assistive device: Rolling walker (2 wheeled) Gait Pattern/deviations: Step-through pattern;Decreased stride length Gait velocity:  varying   General Gait Details: patient with mild unsteadiness throughout likely due to impulsivity; cueing for safety   Stairs             Wheelchair Mobility    Modified Rankin (Stroke Patients Only)       Balance Overall balance assessment: Needs assistance Sitting-balance support: Feet supported Sitting balance-Leahy Scale: Good     Standing balance support: Bilateral upper extremity supported;During functional activity Standing balance-Leahy Scale: Poor Standing balance comment: reliance on RW                            Cognition Arousal/Alertness: Awake/alert Behavior During Therapy: WFL for tasks assessed/performed Overall Cognitive Status: Difficult to assess                                 General Comments: follows commands, however impulsive with reduced safety awareness      Exercises      General Comments        Pertinent Vitals/Pain Pain Assessment: No/denies pain    Home Living                      Prior Function            PT Goals (current goals can now be found in the care plan section) Acute Rehab PT Goals Patient Stated Goal: none stated PT Goal Formulation: With patient Time For Goal Achievement: 10/20/18 Potential to Achieve Goals: Good Progress towards PT goals: Progressing toward goals    Frequency  Min 3X/week      PT Plan Current plan remains appropriate    Lowen-evaluation              AM-PAC PT "6 Clicks" Mobility   Outcome Measure  Help needed turning from your back to your side while in a flat bed without using bedrails?: A Little Help needed moving from lying on your back to sitting on the side of a flat bed without using bedrails?: A Little Help needed moving to and from a bed to a chair (including a wheelchair)?: A Little Help needed standing up from a chair using your arms (e.g., wheelchair or bedside chair)?: A Little Help needed to walk in hospital room?: A  Little Help needed climbing 3-5 steps with a railing? : A Little 6 Click Score: 18    End of Session Equipment Utilized During Treatment: Gait belt Activity Tolerance: Patient tolerated treatment well Patient left: in chair;with call bell/phone within reach;with family/visitor present Nurse Communication: Mobility status PT Visit Diagnosis: Unsteadiness on feet (R26.81)     Time: 1030-1041 PT Time Calculation (min) (ACUTE ONLY): 11 min  Charges:  $Gait Training: 8-22 mins                      Lanney Gins, PT, DPT Supplemental Physical Therapist 10/08/18 11:31 AM Pager: [phone] Office: [phone]

## 2018-10-08 NOTE — Consult Note (Addendum)
[facility] Nurse ostomy consult note Surgical team is following for assessment and plan of care for abd wound. Pt had a colostomy from 4/19 prior to admission and is familiar with pouching routines, emptying, and ordering supplies.  Her son was at the bedside to translate this educational session, since patient does not speak [place]. Stoma type/location: Stoma to right abd from surgery on 2/7; pt had an ileostomy revision. Stomal assessment/size: Stoma is red and viable, slightly above skin level, 2 inches.  There is a red rubber rod in place Peristomal assessment: intact skin surrounding Output: 50cc dark brown liquid stool in the pouch  Ostomy pouching: 2pc.  Education provided: Discussed differences between ileostomy output and dietary precautions.  Reviewed importance of drinking to replace fluids, and s/s of a blockage if it should occur after discharge.  Pt was using the same size of 2 piece pouching system prior to admission and will not need to change to another product.  Discussed the rod will remain in place for 7-10 days and demonstrated pouch change with the rod in place.  Pt asked questions through her son translating.  Extra pouching supplies left at the bedside.  If stoma recedes to flush with skin level, then she may need to learn about adding a barrier ring with the next pouch change session on Wed. Peristomal assessment: intact skin surrounding; there is a staple to lower right abd near ostomy which is covered by the wafer; this will need to be removed in 7 days Enrolled patient in [place] program: No; patient has already been ordering supplies prior to this admission. [place] team will continue to follow for teaching sessions while in the hospital. Julien Girt MSN, RN, [place], [place], [place]

## 2018-10-08 NOTE — Progress Notes (Signed)
S: Reports minimal pain. No nausea. Wants ng out. Walked last night.   Vitals, labs, intake/output, and orders reviewed at this time. Tmax 98.7. HR 74-89. Normotensive. Sats 100% room air. UOP 2875, NG 100, JP 160, Ostomy 300. WBC up 10.6 (6.9 yesterday), Hgb stable 8.3 (8.2), Bicarb dropped to 12 (20), chloride 115, no anion gap. Creatinine 0.61, glucose 64  Gen: A&Ox3, no distress and appears comfortable H&N: EOMI, atraumatic, neck supple Chest: unlabored respirations, RRR Abd: soft, minimally tender to firm palpation, nondistended, incision c/d/i with stable. Ostomy appliance is leaking and there is bilious staining of midline dressing and LLQ colostomy site dressing. No drainage from either of these sites primarily.  No cellulitis or fluctuance. JP output sanguinoserous, slightly opaque. Ostomy is edematous and viable with thin dark output in bag. NG output dark but minimal volume.  Ext: warm, no edema Neuro: grossly normal  Lines/tubes/drains: PIV, NG, JP, foley  A/P:  POD 3 s/p  1. Laparoscopic lysis of adhesions x 120 minutes.  2. Open lysis of adhesions x 90 minutes.  3. Repair of numerous small bowel serosal tears.  4. Small bowel resection approximately 20cm with primary anastomosis.  5. Takedown of end colostomy with colorectal anastomosisapproximately 8cm from anal verge(57mmstapled end to end).  6. Creation of diverting loop ileostomy 7. Port-a-cath removal.  She has not used the PCA and wants it discontinued- DC PCA. Continue scheduled tylenol/ gabapentin, Prn toradol, oxycodone Ambulate, pulmonary toilet Wound/Ostomy nurse consulted- bag is leaking Hyerchloremic/ non-gap metabolic acidosis, hypoglycemia this morning- change IVF to D5 1/2 NS Remove foley NG clamping, sips of clears. She is high risk for ileus.  Recheck labs in AM   [name], MD [facility], [place] Pager [phone]

## 2018-10-09 ENCOUNTER — Telehealth: Payer: Self-pay | Admitting: *Deleted

## 2018-10-09 LAB — BASIC METABOLIC PANEL
Anion gap: 8 (ref 5–15)
BUN: <5 mg/dL — ABNORMAL LOW (ref 6–20)
CO2: 18 mmol/L — ABNORMAL LOW (ref 22–32)
Calcium: 7.9 mg/dL — ABNORMAL LOW (ref 8.9–10.3)
Chloride: 112 mmol/L — ABNORMAL HIGH (ref 98–111)
Creatinine, Ser: 0.53 mg/dL (ref 0.44–1.00)
GFR calc Af Amer: >60 mL/min (ref >60)
Glucose, Bld: 151 mg/dL — ABNORMAL HIGH (ref 70–99)
Potassium: 2.9 mmol/L — ABNORMAL LOW (ref 3.5–5.1)
Sodium: 138 mmol/L (ref 135–145)

## 2018-10-09 LAB — MAGNESIUM: MAGNESIUM: 2 mg/dL (ref 1.7–2.4)

## 2018-10-09 LAB — CBC
HCT: 28.3 % — ABNORMAL LOW (ref 36.0–46.0)
Hemoglobin: 8.2 g/dL — ABNORMAL LOW (ref 12.0–15.0)
MCH: 22.3 pg — ABNORMAL LOW (ref 26.0–34.0)
MCHC: 29 g/dL — ABNORMAL LOW (ref 30.0–36.0)
MCV: 76.9 fL — ABNORMAL LOW (ref 80.0–100.0)
Platelets: 205 10*3/uL (ref 150–400)
RBC: 3.68 MIL/uL — AB (ref 3.87–5.11)
RDW: 15.4 % (ref 11.5–15.5)
WBC: 7.1 10*3/uL (ref 4.0–10.5)
nRBC: 0 % (ref 0.0–0.2)

## 2018-10-09 MED ORDER — BOOST / RESOURCE BREEZE PO LIQD CUSTOM
1.0000 | Freq: Three times a day (TID) | ORAL | Status: DC
  Administered 2018-10-09 – 2018-10-12 (×10): 1 via ORAL

## 2018-10-09 MED ORDER — POTASSIUM CHLORIDE CRYS ER 20 MEQ PO TBCR
20.0000 meq | EXTENDED_RELEASE_TABLET | Freq: Two times a day (BID) | ORAL | Status: DC
  Administered 2018-10-09 – 2018-10-12 (×7): 20 meq via ORAL
  Filled 2018-10-09 (×7): qty 1

## 2018-10-09 MED ORDER — POTASSIUM CHLORIDE 10 MEQ/100ML IV SOLN
10.0000 meq | INTRAVENOUS | Status: AC
  Administered 2018-10-09 (×6): 10 meq via INTRAVENOUS
  Filled 2018-10-09 (×6): qty 100

## 2018-10-09 NOTE — Progress Notes (Signed)
S: No acute issues. Doing well without NG, has had some clears. Nausea once yesterday from dilaudid around 9:30am. States she is not hungry. Walking. Using IS. Notes the incision hurts when she coughs. Asks when she can go home.   Vitals, labs, intake/output, and orders reviewed at this time. Tmax 98.8. HR 68-75. Normotensive. Sats 99% room air. UOP 3300, JP 120, Ostomy 270. WBC 7.1 (10.6 yesterday), Hgb stable 8.3 (8.2), Bicarb up to 18 (12), chloride 112, no anion gap. Creatinine 0.53, glucose 151  Gen: A&Ox3, no distress and appears comfortable H&N: EOMI, atraumatic, neck supple Chest: unlabored respirations, RRR Abd: soft, minimally tender to firm palpation, nondistended, incision c/d/i with stable. No drainage from either midline wound or LLQ colostomy site, but there is fibrinous exudate at the base and blue discoloration of the packing in the colostomy site.  No cellulitis or fluctuance. JP output serosanguinous/ light and clear. Ostomy is edematous and viable, bag is full this morning with thin brown fluid and gas.   Ext: warm, no edema Neuro: grossly normal  Lines/tubes/drains: PIV, JP  A/P:  POD 4 s/p  1. Laparoscopic lysis of adhesions x 120 minutes.  2. Open lysis of adhesions x 90 minutes.  3. Repair of numerous small bowel serosal tears.  4. Small bowel resection approximately 20cm with primary anastomosis.  5. Takedown of end colostomy with colorectal anastomosisapproximately 8cm from anal verge(38mmstapled end to end).  6. Creation of diverting loop ileostomy 7. Port-a-cath removal.  Continue scheduled tylenol/ gabapentin, Prn toradol, oxycodone, dilaudid Ambulate, pulmonary toilet Wound/Ostomy nurse following Will increase packing changes to colostomy site to BID Hyerchloremic/ non-gap metabolic acidosis, hypoglycemia- improved with changed IVF to D5 1/2 NS. Decrease IVF to 75.  Hypokalemia 2.9- replace IV and PO Tolerated NG clamped all day yesterday, NG removed last  night. Will try clear liquid diet today, add BREEZE nutritional supplements. She is high risk for ileus but at this point seems to be progressing well.  Recheck labs in AM   [name], MD [facility], [place] Pager [phone]

## 2018-10-09 NOTE — Progress Notes (Signed)
[facility] education reinforced. Did patient attend class prior to procedure? Yes [ x  ] No [   ] Discussed: Pain Control [   ] Mobility [ x  ] Diet [   ] Other [   ]  Patient in bed. RN states has been walking and sitting in chair. Language barrier- family in room. Patient smiling and interactive. Will follow. Pecolia Ades, RN, BSN Quality Program Coordinator, Enhanced Recovery after Surgery 10/09/18 2:02 PM

## 2018-10-09 NOTE — Progress Notes (Addendum)
Occupational Therapy Treatment Patient Details Name: Elizabeth Saunders MRN: [id number] DOB: 06-18-1956 Today's Date: 10/09/2018    History of present illness Pt is a 63 y/o female now s/p small bowel resection, Takedown of end colostomy with colorectal anastomosis, and creation of diverting loop ileostomy, repair of numerous small bowel serosal tears on 10/05/18. PMHx includes anemia, colon CA, hx of colon resection and colostomy 12/18/17   OT comments  Pt presents sitting up in recliner pleasant and willing to participate in therapy session. Pt completing seated grooming ADL upon entry, performing additional functional mobility during session and requesting to trial mobility without use of RW. Pt completing mobility in room and hallway with overall light minA and no overt LOB noted when mobilizing without UE support. Encouraged continued mobility throughout the day with nursing assist. Will continue to follow acutely to progress pt towards established OT goals.   Follow Up Recommendations  Supervision/Assistance - 24 hour;No OT follow up    Equipment Recommendations  3 in 1 bedside commode(for use in shower, vs none)          Precautions / Restrictions Precautions Precautions: Fall Precaution Comments: jp drain Restrictions Weight Bearing Restrictions: No       Mobility Bed Mobility               General bed mobility comments: OOB in recliner upon arrival  Transfers Overall transfer level: Needs assistance Equipment used: None Transfers: Sit to/from Stand Sit to Stand: Min guard;Supervision         General transfer comment: for lines and safety, no LOB noted or physical assist required    Balance Overall balance assessment: Needs assistance Sitting-balance support: Feet supported Sitting balance-Leahy Scale: Good     Standing balance support: No upper extremity supported Standing balance-Leahy Scale: Fair                             ADL either performed or  assessed with clinical judgement   ADL Overall ADL's : Needs assistance/impaired     Grooming: Set up;Sitting Grooming Details (indicate cue type and reason): pt having just completed washing face upon arrival while seated in recliner                             Functional mobility during ADLs: Min guard;Minimal assistance General ADL Comments: pt verbalizes having already completed most of her ADL this AM, request to practice further mobility without RW, completing in hallway overall with steady gait, light minA provided throughout      Vision       Perception     Praxis      Cognition Arousal/Alertness: Awake/alert Behavior During Therapy: WFL for tasks assessed/performed Overall Cognitive Status: Difficult to assess                                 General Comments: follows commands, however impulsive with reduced safety awareness        Exercises     Shoulder Instructions       General Comments      Pertinent Vitals/ Pain       Pain Assessment: No/denies pain  Home Living  Prior Functioning/Environment              Frequency  Min 2X/week        Progress Toward Goals  OT Goals(current goals can now be found in the care plan section)  Progress towards OT goals: Progressing toward goals  Acute Rehab OT Goals Patient Stated Goal: none stated OT Goal Formulation: With patient Time For Goal Achievement: 10/20/18 Potential to Achieve Goals: Good  Plan Discharge plan needs to be updated    [place]-evaluation                 AM-PAC OT "6 Clicks" Daily Activity     Outcome Measure   Help from another person eating meals?: A Little Help from another person taking care of personal grooming?: A Little Help from another person toileting, which includes using toliet, bedpan, or urinal?: A Little Help from another person bathing (including washing, rinsing, drying)?:  A Little Help from another person to put on and taking off regular upper body clothing?: A Little Help from another person to put on and taking off regular lower body clothing?: A Little 6 Click Score: 18    End of Session Equipment Utilized During Treatment: Gait belt  OT Visit Diagnosis: Muscle weakness (generalized) (M62.81);Unsteadiness on feet (R26.81)   Activity Tolerance Patient tolerated treatment well   Patient Left in chair;with call bell/phone within reach;with family/visitor present   Nurse Communication Mobility status        Time: 8016-5537 OT Time Calculation (min): 11 min  Charges: OT General Charges $OT Visit: 1 Visit OT Treatments $Therapeutic Activity: 8-22 mins  Lou Cal, OT Supplemental Rehabilitation Services Pager [phone] Office [place] 10/09/2018, 9:23 AM

## 2018-10-10 LAB — CBC
HCT: 29.1 % — ABNORMAL LOW (ref 36.0–46.0)
Hemoglobin: 8.6 g/dL — ABNORMAL LOW (ref 12.0–15.0)
MCH: 21.6 pg — AB (ref 26.0–34.0)
MCHC: 29.6 g/dL — ABNORMAL LOW (ref 30.0–36.0)
MCV: 72.9 fL — AB (ref 80.0–100.0)
Platelets: 217 10*3/uL (ref 150–400)
RBC: 3.99 MIL/uL (ref 3.87–5.11)
RDW: 15.1 % (ref 11.5–15.5)
WBC: 7.4 10*3/uL (ref 4.0–10.5)
nRBC: 0 % (ref 0.0–0.2)

## 2018-10-10 LAB — BASIC METABOLIC PANEL
Anion gap: 8 (ref 5–15)
CO2: 20 mmol/L — ABNORMAL LOW (ref 22–32)
Calcium: 8 mg/dL — ABNORMAL LOW (ref 8.9–10.3)
Chloride: 111 mmol/L (ref 98–111)
Creatinine, Ser: 0.37 mg/dL — ABNORMAL LOW (ref 0.44–1.00)
GFR calc Af Amer: >60 mL/min (ref >60)
GFR calc non Af Amer: >60 mL/min (ref >60)
Glucose, Bld: 129 mg/dL — ABNORMAL HIGH (ref 70–99)
Potassium: 3.4 mmol/L — ABNORMAL LOW (ref 3.5–5.1)
Sodium: 139 mmol/L (ref 135–145)

## 2018-10-10 MED ORDER — GABAPENTIN 100 MG PO CAPS
100.0000 mg | ORAL_CAPSULE | Freq: Two times a day (BID) | ORAL | Status: DC
  Administered 2018-10-10 (×2): 100 mg via ORAL
  Filled 2018-10-10 (×2): qty 1

## 2018-10-10 MED ORDER — IBUPROFEN 200 MG PO TABS
600.0000 mg | ORAL_TABLET | Freq: Four times a day (QID) | ORAL | Status: DC | PRN
  Administered 2018-10-11: 600 mg via ORAL
  Filled 2018-10-10: qty 3

## 2018-10-10 MED ORDER — POTASSIUM CHLORIDE 10 MEQ/100ML IV SOLN
10.0000 meq | INTRAVENOUS | Status: AC
  Administered 2018-10-10 (×2): 10 meq via INTRAVENOUS
  Filled 2018-10-10 (×2): qty 100

## 2018-10-10 MED ORDER — ACETAMINOPHEN 325 MG PO TABS
650.0000 mg | ORAL_TABLET | Freq: Four times a day (QID) | ORAL | Status: AC
  Administered 2018-10-10 – 2018-10-12 (×9): 650 mg via ORAL
  Filled 2018-10-10 (×10): qty 2

## 2018-10-10 MED ORDER — TRAMADOL HCL 50 MG PO TABS
50.0000 mg | ORAL_TABLET | Freq: Four times a day (QID) | ORAL | Status: DC | PRN
  Administered 2018-10-11: 50 mg via ORAL
  Filled 2018-10-10: qty 1

## 2018-10-10 NOTE — Consult Note (Addendum)
[facility] Nurse ostomy follow up Patient receiving care in [place] 1523. Dual language speaking Daughter in law present at the time of my visit.  I am told the patient speaks [place] and [place].  Neither the daughter in law nor the son will be present tonight.  I explained we have an electronic portable device that can be used for translation. Stoma type/location:  RUG Stomal assessment/size: Existing pouch was placed yesterday according to the information the daughter in law provided, and not in need of changing at this time.  It looks like the stoma is approximately 2 inches in diameter, budded, edematous, red and moist. Peristomal assessment: Deferred Treatment options for stomal/peristomal skin: barrier ring Output:  Scant serousanginous in clean pouch Ostomy pouching: 2pc. Flat cut to fit Education provided: Patient did not have any questions except if it is okay to continue to order supplies from [facility].  They were given instructions on how to proceed with that. Enrolled patient in [place] Start Discharge program: Long term Chirop[employer] customers per the daughter in Sports coach. Val Riles, RN, MSN, CWOCN, CNS-BC, pager [phone]

## 2018-10-10 NOTE — Progress Notes (Addendum)
S: No acute issues. Took in lots of clears yesterday. Denies pain or nausea, but little appetite. Walking. Using IS. Wants to go home.   Vitals, labs, intake/output, and orders reviewed at this time. Tmax 99.1. HR 81-85. Normotensive. Sats 99% room air. UOP 1500, JP 15 (it is full though this morning), Ostomy nothing recorded but she has emptied the bag 4 times and it contains about 71mL thin stool and copious gas this morning. WBC stable 7.4, Hgb stable 8.6, Bicarb up to 20 (18), chloride 111, no anion gap. Creatinine 0.37, glucose 129  Gen: A&Ox3, no distress and appears comfortable H&N: EOMI, atraumatic, neck supple Chest: unlabored respirations, RRR Abd: soft, nontender, nondistended, incision c/d/i with no drainage from either midline wound or LLQ colostomy site, decreased amt of fibrinous exudate at the base and blue discoloration of the packing in the colostomy site (likely from dyed vicryl).  No cellulitis or fluctuance. JP output serous, bulb is full. Ostomy is edematous and viable, bag is full this morning with thin brown fluid and gas.   Ext: warm, no edema Neuro: grossly normal  Lines/tubes/drains: PIV, JP  A/P:  POD 5 s/p  1. Laparoscopic lysis of adhesions x 120 minutes.  2. Open lysis of adhesions x 90 minutes.  3. Repair of numerous small bowel serosal tears.  4. Small bowel resection approximately 20cm with primary anastomosis.  5. Takedown of end colostomy with colorectal anastomosisapproximately 8cm from anal verge(47mmstapled end to end).  6. Creation of diverting loop ileostomy 7. Port-a-cath removal.  Continue scheduled tylenol at decreased dose, wean gabapentin, stop Prn toradol, oxycodone, dilaudid- she has not used any of these in 48h. Will add prn ibuprofen and tramadol. Ambulate, pulmonary toilet Wound/Ostomy nurse following Continue packing changes to colostomy site to BID Hyerchloremic/ non-gap metabolic acidosis, hypoglycemia- improving. Stop IVF today.   Hypokalemia 3.4- replace PO & IV Tolerating clears. Will advance to soft diet today. She is high risk for ileus but at this point seems to be progressing well.   Asked her to let the staff measure and record ostomy output. If tolerating a soft diet and ostomy output is acceptable, possible discharge home tomorrow.   [name], MD [facility], [place] Pager [phone]

## 2018-10-10 NOTE — Plan of Care (Signed)
  Problem: Clinical Measurements: Goal: Postoperative complications will be avoided or minimized Outcome: Progressing   Problem: Skin Integrity: Goal: Demonstration of wound healing without infection will improve Outcome: Progressing   Problem: Nutrition: Goal: Adequate nutrition will be maintained Outcome: Progressing   Problem: Pain Managment: Goal: General experience of comfort will improve Outcome: Progressing

## 2018-10-10 NOTE — Progress Notes (Signed)
Physical Therapy Treatment Patient Details Name: Elizabeth Saunders MRN: [id number] DOB: 1956/02/01 Today's Date: 10/10/2018    History of Present Illness Pt is a 63 y/o female now s/p small bowel resection, Takedown of end colostomy with colorectal anastomosis, and creation of diverting loop ileostomy, repair of numerous small bowel serosal tears on 10/05/18. PMHx includes anemia, colon CA, hx of colon resection and colostomy 12/18/17    PT Comments    Pt ambulated 800' without an assistive device, no loss of balance. Instructed pt/daughter in log roll for bed mobility. Pt is independent with mobility, no further PT indicated. Encouraged pt to ambulate in halls at least TID. PT signing off.    Follow Up Recommendations  No PT follow up     Equipment Recommendations  None recommended by PT(vs none)    Recommendations for Other Services       Precautions / Restrictions Precautions Precautions: Fall Precaution Comments: jp drain Restrictions Weight Bearing Restrictions: No    Mobility  Bed Mobility   Bed Mobility: Rolling;Sidelying to Sit;Sit to Sidelying Rolling: Independent Sidelying to sit: Independent     Sit to sidelying: Independent General bed mobility comments: instructed pt in log rolling technique to minimize strain to abdomen  Transfers Overall transfer level: Independent Equipment used: None Transfers: Sit to/from Stand Sit to Stand: Independent Stand pivot transfers: Independent          Ambulation/Gait Ambulation/Gait assistance: Independent Gait Distance (Feet): 800 Feet Assistive device: None Gait Pattern/deviations: WFL(Within Functional Limits) Gait velocity: WNL   General Gait Details: steady without AD, no loss of balance with head turns   Marine scientist Rankin (Stroke Patients Only)       Balance Overall balance assessment: Needs assistance Sitting-balance support: Feet supported Sitting  balance-Leahy Scale: Good     Standing balance support: No upper extremity supported Standing balance-Leahy Scale: Good                              Cognition Arousal/Alertness: Awake/alert Behavior During Therapy: WFL for tasks assessed/performed Overall Cognitive Status: Within Functional Limits for tasks assessed                                 General Comments: follows commands via daughter translating, no impulsive behavior today      Exercises      General Comments        Pertinent Vitals/Pain Pain Score: 2  Pain Location: abdomen Pain Descriptors / Indicators: Sore Pain Intervention(s): Limited activity within patient's tolerance;Monitored during session    Home Living                      Prior Function            PT Goals (current goals can now be found in the care plan section) Acute Rehab PT Goals Patient Stated Goal: care for grandchildren PT Goal Formulation: With patient/family Time For Goal Achievement: 10/20/18 Potential to Achieve Goals: Good Progress towards PT goals: Goals met/education completed, patient discharged from PT    Frequency    Min 3X/week      PT Plan Current plan remains appropriate    [place]-evaluation              AM-PAC PT "6 Clicks"  Mobility   Outcome Measure  Help needed turning from your back to your side while in a flat bed without using bedrails?: None Help needed moving from lying on your back to sitting on the side of a flat bed without using bedrails?: None Help needed moving to and from a bed to a chair (including a wheelchair)?: None Help needed standing up from a chair using your arms (e.g., wheelchair or bedside chair)?: None Help needed to walk in hospital room?: None Help needed climbing 3-5 steps with a railing? : A Little 6 Click Score: 23    End of Session   Activity Tolerance: Patient tolerated treatment well Patient left: with call bell/phone within  reach;with family/visitor present;in bed Nurse Communication: Mobility status PT Visit Diagnosis: Unsteadiness on feet (R26.81)     Time: 5427-0623 PT Time Calculation (min) (ACUTE ONLY): 11 min  Charges:  $Gait Training: 8-22 mins                     Blondell Reveal Kistler PT 10/10/2018  Acute Rehabilitation Services Pager [phone] Office [phone]

## 2018-10-10 NOTE — Telephone Encounter (Signed)
Error

## 2018-10-11 MED ORDER — GABAPENTIN 300 MG PO CAPS
300.0000 mg | ORAL_CAPSULE | Freq: Two times a day (BID) | ORAL | Status: DC
  Administered 2018-10-11 – 2018-10-12 (×3): 300 mg via ORAL
  Filled 2018-10-11 (×4): qty 1

## 2018-10-11 NOTE — Consult Note (Addendum)
[place] Nurse ostomy follow up Surgical team is following for assessment and plan of care for abd wound. Pt had a colostomy from 4/19 prior to admission and is familiar with pouching routines, emptying, and ordering supplies.  Her son was at the bedside to translate this educational session, since patient does not speak [place].  Stoma type/location: Ileostomy stoma to right abd from surgery on 2/7; pt had a revision. Stomal assessment/size: Requested to remove red rubber rod from stoma, this was performed.  Stoma is red and viable, slightly above skin level, 2 inches.   Peristomal assessment: intact skin surrounding; there is a staple to lower right abd near ostomy which is covered by the wafer; this will need to be removed in 7 days Output: 50cc dark brown liquid stool in the pouch  Ostomy pouching: 2pc.  Education provided: Pt was using the same size of 2 piece pouching system prior to admission and will not need to change to another product. Extra pouching supplies left at the bedside.   Enrolled patient in [employer] program: No; patient has already been ordering supplies prior to this admission. Julien Girt MSN, RN, [place], [place], [place]

## 2018-10-11 NOTE — Progress Notes (Signed)
S: Reports pain last night after eating, some bloating, no nausea or emesis. I did cut her gabapentin and tylenol doses significantly yesterday. She has not required any PRN medications last 24h. Her pain is now better. Feels hot this morning. Reports emptied her bag twice yesterday.   Vitals, labs, intake/output, and orders reviewed at this time. Tmax 98.4. HR 79-92. Normotensive. Sats 99% room air. UOP 1300 + 1 void, JP 65, Ostomy 275mL recorded, she reports emptying the bag twice yesterday. No labs this morning.  Gen: A&Ox3, no distress and appears comfortable H&N: EOMI, atraumatic, neck supple Chest: unlabored respirations, RRR Abd: soft, mildly tender left lower and midline. Nondistended, incision c/d/i with no drainage from either midline wound or LLQ colostomy site, which appears to be granulating without significant exudate now.  No cellulitis or fluctuance. JP output remains serous. Ostomy is edematous and viable, liquid and gelatinous stool in pouch now about 17mL.   Ext: warm, no edema Neuro: grossly normal  Lines/tubes/drains: PIV, JP  A/P:  POD 6 s/p  1. Laparoscopic lysis of adhesions x 120 minutes.  2. Open lysis of adhesions x 90 minutes.  3. Repair of numerous small bowel serosal tears.  4. Small bowel resection approximately 20cm with primary anastomosis.  5. Takedown of end colostomy with colorectal anastomosisapproximately 8cm from anal verge(72mmstapled end to end).  6. Creation of diverting loop ileostomy 7. Port-a-cath removal.  Continue scheduled tylenol at decreased dose, will go back up on gabapentin, continue prn ibuprofen and tramadol. Ambulate, pulmonary toilet Wound/Ostomy nurse following- remove red rubber catheter today Continue packing changes to colostomy site to BID Continue soft diet. If food continues to cause pain may need to stay on liquid diet/ protein shakes. She is high risk for ileus but at this point seems to be progressing ok.   I would  like to keep her at least one more day given pain last night. She is agreeable; in fact this is the first day she has not asked if she can go home.   [name], MD [facility], [place] Pager [phone]

## 2018-10-12 MED ORDER — GABAPENTIN 300 MG PO CAPS: 300.0000 mg | ORAL_CAPSULE | Freq: Two times a day (BID) | ORAL | 1 refills | Status: DC

## 2018-10-12 MED ORDER — IBUPROFEN 200 MG PO TABS: 200.0000 mg | ORAL_TABLET | Freq: Four times a day (QID) | ORAL | Status: DC | PRN

## 2018-10-12 MED ORDER — TRAMADOL HCL 50 MG PO TABS: 50.0000 mg | ORAL_TABLET | Freq: Four times a day (QID) | ORAL | 0 refills | Status: DC | PRN

## 2018-10-12 MED ORDER — ACETAMINOPHEN 325 MG PO TABS: 650.0000 mg | ORAL_TABLET | Freq: Four times a day (QID) | ORAL | Status: DC

## 2018-10-12 MED FILL — GABAPENTIN 300 MG CAPSULE: 300 | 5 days supply | Qty: 10 | Fill #0

## 2018-10-12 NOTE — Progress Notes (Signed)
S: No more pain, feels good this morning. Ate well yesterday, emptied bag twice.   Vitals, labs, intake/output, and orders reviewed at this time. Tmax 98.2. HR 51-86.  Normotensive. Sats 98% room air. UOP 800, JP 35, Ostomy 354mL recorded, she reports emptying the bag twice yesterday. No labs this morning.  Gen: A&Ox3, no distress and appears comfortable H&N: EOMI, atraumatic, neck supple Chest: unlabored respirations, RRR Abd: soft, mildly tender left lower and midline. Nondistended, incision c/d/i with no drainage from either midline wound or LLQ colostomy site, which appears to be granulating without significant exudate now.  No cellulitis or fluctuance. JP output remains serous. Ostomy is edematous and viable,  Pudding-consistency stool and gas in pouch now about 67mL.   Ext: warm, no edema Neuro: grossly normal  Lines/tubes/drains: PIV, JP  A/P:  POD 7 s/p  1. Laparoscopic lysis of adhesions x 120 minutes.  2. Open lysis of adhesions x 90 minutes.  3. Repair of numerous small bowel serosal tears.  4. Small bowel resection approximately 20cm with primary anastomosis.  5. Takedown of end colostomy with colorectal anastomosisapproximately 8cm from anal verge(75mmstapled end to end).  6. Creation of diverting loop ileostomy 7. Port-a-cath removal.  Continue scheduled tylenol and gabapentin, continue prn ibuprofen and tramadol (she received 1 dose each yesterday). Ambulate, pulmonary toilet Wound/Ostomy nurse following Continue packing changes to colostomy site to BID- when she goes home she can just cover this with a dry dressing. Continue soft diet. If food continues to cause pain may need to stay on liquid diet/ protein shakes.   She feels ready to go home today; she has done very well despite a very difficult and extensive surgery. I can see no objective reason to keep her in the hospital.    [name], MD [facility] Surgery, [place] Pager [phone]

## 2018-10-12 NOTE — Progress Notes (Signed)
Pt alert and oriented. Gave d/c instructions to her and her son. Prescription was also given and pt was d/cd home.

## 2018-10-12 NOTE — Discharge Instructions (Signed)
ABDOMINAL SURGERY: POST OP INSTRUCTIONS  1. DIET: Follow a light bland diet the first 24 hours after arrival home, such as soup, liquids, crackers, etc.  Be sure to include lots of fluids daily.  Avoid fast food or heavy meals as your are more likely to get nauseated.  Do not eat any uncooked fruits or vegetables for the next 2 weeks as your colon heals. It is best to stick with soft easily digested foods.  If you are struggling with solid foods, it is okay to go on a liquid diet for 2-3 days before trying solid again.  If you are on a liquid diet, please make sure that your drinking 3 or 4 Boost or Ensure shakes every day to make sure you're getting enough calories and protein to heal.. 2. Take your usually prescribed home medications unless otherwise directed. 3. PAIN CONTROL: a. Pain is best controlled by a usual combination of three different methods TOGETHER: i. Ice/Heat ii. Over the counter pain medication iii. Prescription pain medication b. Most patients will experience some swelling and bruising around the incisions.  Ice packs or heating pads (30-60 minutes up to 6 times a day) will help. Use ice for the first few days to help decrease swelling and bruising, then switch to heat to help relax tight/sore spots and speed recovery.  Some people prefer to use ice alone, heat alone, alternating between ice & heat.  Experiment to what works for you.  Swelling and bruising can take several weeks to resolve.   c. It is helpful to take an over-the-counter pain medication regularly for the first few weeks.  Choose one of the following that works best for you: i. Naproxen (Aleve, etc)  Two 220mg  tabs twice a day ii. Ibuprofen (Advil, etc) Three 200mg  tabs four times a day (every meal & bedtime) iii. Acetaminophen (Tylenol, etc) 500-650mg  four times a day (every meal & bedtime) d. A  prescription for pain medication (such as oxycodone, hydrocodone, etc) should be given to you upon discharge.  Take your  pain medication as prescribed.  i. If you are having problems/concerns with the prescription medicine (does not control pain, nausea, vomiting, rash, itching, etc), please call us [phone] to see if we need to switch you to a different pain medicine that will work better for you and/or control your side effect better. ii. If you need a refill on your pain medication, please contact your pharmacy.  They will contact our office to request authorization. Prescriptions will not be filled after 5 pm or on week-ends. 4. Avoid getting constipated.  Between the surgery and the pain medications, it is common to experience some constipation.  Increasing fluid intake and taking a fiber supplement (such as Metamucil, Citrucel, FiberCon, MiraLax, etc) 1-2 times a day regularly will usually help prevent this problem from occurring.  A mild laxative (prune juice, Milk of Magnesia, MiraLax, etc) should be taken according to package directions if there are no bowel movements after 48 hours.   5. Watch out for diarrhea.  If you have many loose bowel movements (emptying your bag more than 4 times per day), simplify your diet to bland foods & liquids for a few days.  Stop any stool softeners and decrease your fiber supplement.  Switching to mild anti-diarrheal medications (Kayopectate, Pepto Bismol) can help.  If this worsens or does not improve, please call us.  6. Wash / shower every day.  You may shower over the incision / wound.  Clean  the incision and the open wound with mild soap and water every day. Avoid baths until the skin is fully healed.  Continue to shower over incision(s) after the dressing is off. 7. You may leave the stapled incisions open to air.  You may replace a dressing/Band-Aid to cover the incision for comfort if you wish. Keep a dry gauze dressing over the drain site and the old colostomy site on her left side, change this at least once a day. 8. ACTIVITIES as tolerated:   a. You may resume regular  (light) daily activities beginning the next day--such as daily self-care, walking, climbing stairs--gradually increasing activities as tolerated.  If you can walk 30 minutes without difficulty, it is safe to try more intense activity such as jogging, treadmill, bicycling, low-impact aerobics, swimming, etc. b. Refrain from the most intensive and strenuous activity for last such as sit-ups, heavy lifting, contact sports, etc  Refrain from any heavy lifting or straining until 6 weeks after surgery.   c. DO NOT PUSH THROUGH PAIN.  Let pain be your guide: If it hurts to do something, don't do it.  Pain is your body warning you to avoid that activity for another week until the pain goes down. d. You may drive when you are no longer taking prescription pain medication, you can comfortably wear a seatbelt, and you can safely maneuver your car and apply brakes. e. Dennis Bast may have sexual intercourse when it is comfortable.  9. FOLLOW UP in our office a. Please call [facility] at [phone] to set up an appointment to see your surgeon in the office for a follow-up appointment approximately 1-2 weeks after your surgery. b. Make sure that you call for this appointment the day you arrive home to insure a convenient appointment time. 10. IF YOU HAVE DISABILITY OR FAMILY LEAVE FORMS, BRING THEM TO THE OFFICE FOR PROCESSING.  DO NOT GIVE THEM TO YOUR DOCTOR.   WHEN TO CALL us [phone]: 1. Poor pain control 2. Reactions / problems with new medications (rash/itching, nausea, etc)  3. Fever over 101.5 F (38.5 C) 4. Inability to urinate 5. Nausea and/or vomiting 6. Worsening swelling or bruising 7. Continued bleeding from incision. 8. Increased pain, redness, or drainage from the incision  The clinic staff is available to answer your questions during regular business hours (8:30am-5pm).  Please dont hesitate to call and ask to speak to one of our nurses for clinical concerns.   A surgeon from [facility]  Surgery is always on call at the hospitals   If you have a medical emergency, go to the nearest emergency room or call 911.    [facility], [place], [place], [place], [place]  [address] ? MAIN: [phone] ? TOLL FREE: [phone] ? FAX [phone] [email or web address]

## 2018-10-12 NOTE — Discharge Summary (Signed)
Physician Discharge Summary  Patient ID: Elizabeth Saunders MRN: [id number] DOB/AGE: [birth year] 61 y.o.  Admit date: 10/05/2018 Discharge date: 10/12/2018  Admission Diagnoses:colostomy  Discharge Diagnoses:  Active Problems:   S/P colostomy takedown   Discharged Condition: good  Hospital Course: she was admitted for postoperative care following somewhat complicated colostomy reversal-see operative note.  She progressed very well and really had minimal pain medication requirements.  Her ostomy began to function, she was able to tolerate a diet and she remained afebrile with normal vital signs, normal white blood cell count throughout her stay.  On postoperative day 7 she was deemed stable for discharge home.  Consults: wound ostomy nurse  Significant Diagnostic Studies: labs, see Epic record  Treatments:  Surgery on February 7: 1. Laparoscopic lysis of adhesions x 120 minutes.  2. Open lysis of adhesions x 90 minutes.  3. Repair of numerous small bowel serosal tears.  4. Small bowel resection approximately 20cm with primary anastomosis.  5. Takedown of end colostomy with colorectal anastomosisapproximately 8cm from anal verge(35mmstapled end to end).  6. Creation of diverting loop ileostomy 7. Port-a-cath removal.  Discharge Exam: Blood pressure 117/71, pulse (!) 51, temperature 98 F (36.7 C), temperature source Oral, resp. rate 14, height 5' (1.524 m), weight 55 kg, SpO2 98 %. See rounding note   Disposition: Discharge disposition: 01-Home or Self Care       Discharge Instructions    Call MD for:   Complete by:  As directed    Temperature greater than 101.5   Call MD for:  persistant nausea and vomiting   Complete by:  As directed    Call MD for:  redness, tenderness, or signs of infection (pain, swelling, redness, odor or green/yellow discharge around incision site)   Complete by:  As directed    Call MD for:  severe uncontrolled pain   Complete by:  As directed    Diet  general   Complete by:  As directed    Stay on a soft/ low residue diet for the next 2 weeks. Avoid raw fruits or vegetables, heavy breads or dense meats. If you are struggling with solid foods, ok to stay on a liquid diet for 2-3 days before trying solids again. If you are on a liquid diet, try to drink 3-4 boost or ensure shakes per day to ensure you are getting enough protein and calories to heal.   Discharge wound care:   Complete by:  As directed    Shower daily and wash abdomen and open wound to left lower abdomen with gentle soap and water. Cover drain site and left lower abdominal wound with a dry gauze. Change this at least daily. OK to leave stapled incisions open to air. OK to cover with a dry dressing if desired.   Increase activity slowly   Complete by:  As directed    Lifting restrictions   Complete by:  As directed    No lifting over 20lb for 4 weeks     Allergies as of 10/12/2018   No Known Allergies     Medication List    STOP taking these medications   FEROSUL 325 (65 FE) MG tablet Generic drug:  ferrous sulfate     TAKE these medications   acetaminophen 325 MG tablet Commonly known as:  TYLENOL Take 2 tablets (650 mg total) by mouth every 6 (six) hours. Ok to start tapering off this medication- can stretch out the interval between doses or decrease the dose, if  pain is minimal can take this medication only when needed instead of scheduled. What changed:    when to take this  reasons to take this  additional instructions   gabapentin 300 MG capsule Commonly known as:  NEURONTIN Take 1 capsule (300 mg total) by mouth 2 (two) times daily for 5 days.   ibuprofen 200 MG tablet Commonly known as:  ADVIL,MOTRIN Take 1-3 tablets (200-600 mg total) by mouth every 6 (six) hours as needed for mild pain or moderate pain (Take for pain not relieved by tylenol or gabapentin. Try this before using the tramadol.).   traMADol 50 MG tablet Commonly known as:  ULTRAM Take  1 tablet (50 mg total) by mouth every 6 (six) hours as needed for severe pain (Take for pain not relieved by tylenol, ibuprofen, or gabapentin.).            Discharge Care Instructions  (From admission, onward)         Start     Ordered   10/12/18 0000  Discharge wound care:    Comments:  Shower daily and wash abdomen and open wound to left lower abdomen with gentle soap and water. Cover drain site and left lower abdominal wound with a dry gauze. Change this at least daily. OK to leave stapled incisions open to air. OK to cover with a dry dressing if desired.   10/12/18 1229         Follow-up Information    Clovis Riley, MD Follow up in 1 week(s).   Specialty:  General Surgery Why:  Staple removal 10-14 days after surgery Contact information: [address] [place] [address] [phone]           Signed: YALEXA BLUST 10/12/2018, 12:36 PM

## 2018-10-25 DIAGNOSIS — Z932 Ileostomy status: Secondary | ICD-10-CM | POA: Diagnosis not present

## 2018-11-26 DIAGNOSIS — Z932 Ileostomy status: Secondary | ICD-10-CM | POA: Diagnosis not present

## 2018-12-05 ENCOUNTER — Other Ambulatory Visit: Payer: Self-pay | Admitting: Surgery

## 2018-12-06 ENCOUNTER — Other Ambulatory Visit: Payer: Self-pay | Admitting: Surgery

## 2018-12-06 DIAGNOSIS — C189 Malignant neoplasm of colon, unspecified: Secondary | ICD-10-CM

## 2018-12-14 ENCOUNTER — Other Ambulatory Visit: Payer: Medicaid Other

## 2018-12-14 ENCOUNTER — Telehealth: Payer: Self-pay | Admitting: *Deleted

## 2018-12-14 NOTE — Telephone Encounter (Signed)
Called to inquire if his CT scan that was canceled at [facility] needs to still be done since he is having a barium enema study there on 12/28/18. Noted he is Stage III colon and did have a pulmonary nodule w/last CT and need to also assess liver and surrounding organs, so yes the CT scan is still needed. Has labs here on 4/20 and needs scan before he is seen here on 4/22. Elizabeth Saunders will work on getting the scan scheduled.

## 2018-12-17 ENCOUNTER — Ambulatory Visit (HOSPITAL_COMMUNITY): Admission: RE | Admit: 2018-12-17 | Payer: Medicaid Other | Source: Ambulatory Visit

## 2018-12-17 ENCOUNTER — Other Ambulatory Visit: Payer: Self-pay

## 2018-12-17 ENCOUNTER — Inpatient Hospital Stay: Payer: Medicaid Other | Attending: Oncology

## 2018-12-17 DIAGNOSIS — C20 Malignant neoplasm of rectum: Secondary | ICD-10-CM

## 2018-12-17 LAB — BASIC METABOLIC PANEL - CANCER CENTER ONLY
Anion gap: 11 (ref 5–15)
BUN: 8 mg/dL (ref 6–20)
CO2: 21 mmol/L — ABNORMAL LOW (ref 22–32)
Calcium: 9.2 mg/dL (ref 8.9–10.3)
Chloride: 109 mmol/L (ref 98–111)
Creatinine: 0.73 mg/dL (ref 0.44–1.00)
GFR, Est AFR Am: >60 mL/min (ref >60)
GFR, Estimated: >60 mL/min (ref >60)
Glucose, Bld: 100 mg/dL — ABNORMAL HIGH (ref 70–99)
Potassium: 3.7 mmol/L (ref 3.5–5.1)
Sodium: 141 mmol/L (ref 135–145)

## 2018-12-17 LAB — CEA (IN HOUSE-CHCC): CEA (CHCC-In House): 1.52 ng/mL (ref 0.00–5.00)

## 2018-12-19 ENCOUNTER — Other Ambulatory Visit: Payer: Medicaid Other

## 2018-12-19 ENCOUNTER — Telehealth: Payer: Self-pay

## 2018-12-19 ENCOUNTER — Other Ambulatory Visit: Payer: Self-pay | Admitting: Oncology

## 2018-12-19 ENCOUNTER — Inpatient Hospital Stay: Payer: Medicaid Other | Admitting: Nurse Practitioner

## 2018-12-19 DIAGNOSIS — C20 Malignant neoplasm of rectum: Secondary | ICD-10-CM

## 2018-12-19 NOTE — Telephone Encounter (Signed)
TC per Lattie Haw to pt to let her know that her appointment today (12/19/18 at 1:45p)  is canceled but to still go to CT appointment tomorrow. Doctors appointment will be moved back a month. Pt verbalized understanding. No further problems or concerns at this time.

## 2018-12-20 ENCOUNTER — Other Ambulatory Visit: Payer: Medicaid Other

## 2018-12-20 ENCOUNTER — Ambulatory Visit
Admission: RE | Admit: 2018-12-20 | Discharge: 2018-12-20 | Disposition: A | Discharge status: Home / Self Care | Payer: Medicaid Other | Source: Ambulatory Visit | Attending: Oncology | Admitting: Oncology

## 2018-12-20 ENCOUNTER — Other Ambulatory Visit: Payer: Self-pay

## 2018-12-20 DIAGNOSIS — C20 Malignant neoplasm of rectum: Secondary | ICD-10-CM

## 2018-12-20 MED ORDER — IOPAMIDOL (ISOVUE-300) INJECTION 61%
100.0000 mL | Freq: Once | INTRAVENOUS | Status: AC | PRN
  Administered 2018-12-20: 15:00:00 100 mL via INTRAVENOUS

## 2018-12-21 ENCOUNTER — Telehealth: Payer: Self-pay | Admitting: *Deleted

## 2018-12-21 ENCOUNTER — Other Ambulatory Visit: Payer: Medicaid Other

## 2018-12-21 NOTE — Telephone Encounter (Signed)
-----   Message from Ladell Pier, MD sent at 12/21/2018  8:18 AM EDT ----- Please call patient, CTs are negative for cancer

## 2018-12-21 NOTE — Telephone Encounter (Signed)
Notified son of CT results

## 2018-12-25 ENCOUNTER — Telehealth: Payer: Self-pay | Admitting: *Deleted

## 2018-12-25 ENCOUNTER — Other Ambulatory Visit: Payer: Self-pay | Admitting: Internal Medicine

## 2018-12-25 NOTE — Telephone Encounter (Signed)
VM from [place] with [facility]: They have a script from Dr. Benay Spice for ostomy supplies, but need a diagnosis to support this. Also need clinical faxed to [phone]. Requested return call Called back and left VM for nurse to please fax copy of the script sent by Dr. Benay Spice to verify this came from medical oncology instead of surgery. Noted patient had ileostomy placed on 10/05/18 by Dr. Kae Heller (Z93.2).

## 2018-12-27 ENCOUNTER — Encounter: Payer: Self-pay | Admitting: *Deleted

## 2018-12-27 NOTE — Progress Notes (Signed)
Faxed diagnosis codes to [place] for ostomy supplies as requested: Dx: colostomy in place (Z93.3)-d/c'd on 10/05/2018  Dx: ileostomy in place (Z93.2)--started 10/05/2018 Also faxed operative report of 10/05/2018, wound ostomy nurse notes and discharge summary to [place] at [phone].

## 2018-12-28 ENCOUNTER — Ambulatory Visit
Admission: RE | Admit: 2018-12-28 | Discharge: 2018-12-28 | Disposition: A | Discharge status: Home / Self Care | Payer: Medicaid Other | Source: Ambulatory Visit | Attending: Surgery | Admitting: Surgery

## 2018-12-28 DIAGNOSIS — C189 Malignant neoplasm of colon, unspecified: Secondary | ICD-10-CM | POA: Diagnosis not present

## 2019-01-03 ENCOUNTER — Ambulatory Visit: Payer: Self-pay | Admitting: Surgery
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
## 2019-01-10 DIAGNOSIS — Z932 Ileostomy status: Secondary | ICD-10-CM | POA: Diagnosis not present

## 2019-01-10 DIAGNOSIS — Z933 Colostomy status: Secondary | ICD-10-CM | POA: Diagnosis not present

## 2019-03-21 ENCOUNTER — Other Ambulatory Visit (HOSPITAL_COMMUNITY): Payer: Self-pay | Admitting: *Deleted

## 2019-03-21 NOTE — Patient Instructions (Signed)
YOU HAD  A COVID 19 TEST ON 03-23-2019. ONCE YOUR COVID TEST IS COMPLETED, PLEASE BEGIN THE QUARANTINE INSTRUCTIONS AS OUTLINED IN YOUR HANDOUT.                Elizabeth Saunders    Your procedure is scheduled on: 03-27-2019  Report to [facility] Main  Entrance  Report to admitting at 67  AM   1 [place].    Call this number if you have problems the morning of surgery [phone]    Remember: Do not eat food or drink liquids :After Midnight. BRUSH YOUR TEETH MORNING OF SURGERY AND RINSE YOUR MOUTH OUT, NO CHEWING GUM CANDY OR MINTS.   DRINK 2 PRESURGERY ENSURE DRINKS THE NIGHT BEFORE SURGERY AT  1000 PM AND 1 PRESURGERY DRINK THE DAY OF THE PROCEDURE AT 530 AM.. NO SOLIDS AFTER MIDNIGHT THE DAY PRIOR TO THE SURGERY. NOTHING BY MOUTH EXCEPT CLEAR LIQUIDS UNTIL THREE HOURS PRIOR TO SCHEDULED SURGERY. PLEASE FINISH PRESURGERY ENSURE DRINK PER SURGEON ORDER 3 HOURS PRIOR TO SCHEDULED SURGERY TIME WHICH NEEDS TO BE COMPLETED AT 530 AM.     Take these medicines the morning of surgery with A SIP OF WATER: NONE             You may not have any metal on your body including hair pins and              piercings  Do not wear jewelry, make-up, lotions, powders or perfumes, deodorant             Do not wear nail polish.  Do not shave  48 hours prior to surgery.                 Do not bring valuables to the hospital. [place].  Contacts, dentures or bridgework may not be worn into surgery.  Leave suitcase in the car. After surgery it may be brought to your room.      _____________________________________________________________________             [facility] - Preparing for Surgery Before surgery, you can play an important role.  Because skin is not sterile, your skin needs to be as free of germs as possible.  You can reduce the number of germs on your skin by washing with CHG  (chlorahexidine gluconate) soap before surgery.  CHG is an antiseptic cleaner which kills germs and bonds with the skin to continue killing germs even after washing. Please DO NOT use if you have an allergy to CHG or antibacterial soaps.  If your skin becomes reddened/irritated stop using the CHG and inform your nurse when you arrive at Short Stay. Do not shave (including legs and underarms) for at least 48 hours prior to the first CHG shower.  You may shave your face/neck. Please follow these instructions carefully:  1.  Shower with CHG Soap the night before surgery and the  morning of Surgery.  2.  If you choose to wash your hair, wash your hair first as usual with your  normal  shampoo.  3.  After you shampoo, rinse your hair and body thoroughly to remove the  shampoo.  4.  Use CHG as you would any other liquid soap.  You can apply chg directly  to the skin and wash                       Gently with a scrungie or clean washcloth.  5.  Apply the CHG Soap to your body ONLY FROM THE NECK DOWN.   Do not use on face/ open                           Wound or open sores. Avoid contact with eyes, ears mouth and genitals (private parts).                       Wash face,  Genitals (private parts) with your normal soap.             6.  Wash thoroughly, paying special attention to the area where your surgery  will be performed.  7.  Thoroughly rinse your body with warm water from the neck down.  8.  DO NOT shower/wash with your normal soap after using and rinsing off  the CHG Soap.                9.  Pat yourself dry with a clean towel.            10.  Wear clean pajamas.            11.  Place clean sheets on your bed the night of your first shower and do not  sleep with pets. Day of Surgery : Do not apply any lotions/deodorants the morning of surgery.  Please wear clean clothes to the hospital/surgery center.  FAILURE TO FOLLOW THESE INSTRUCTIONS MAY RESULT IN THE CANCELLATION OF  YOUR SURGERY PATIENT SIGNATURE_________________________________  NURSE SIGNATURE__________________________________  ________________________________________________________________________

## 2019-03-21 NOTE — Progress Notes (Addendum)
EKG 09-27-18 Epic  CHEST CT WITH CONTRAST 12-20-18 EPIC

## 2019-03-23 ENCOUNTER — Other Ambulatory Visit (HOSPITAL_COMMUNITY)
Admission: RE | Admit: 2019-03-23 | Discharge: 2019-03-23 | Disposition: A | Discharge status: Home / Self Care | Payer: Medicaid Other | Source: Ambulatory Visit | Attending: Surgery | Admitting: Surgery

## 2019-03-23 DIAGNOSIS — Z1159 Encounter for screening for other viral diseases: Secondary | ICD-10-CM | POA: Insufficient documentation

## 2019-03-23 LAB — SARS CORONAVIRUS 2 (TAT 6-24 HRS): SARS Coronavirus 2: NEGATIVE

## 2019-03-25 ENCOUNTER — Encounter (HOSPITAL_COMMUNITY)
Admission: RE | Admit: 2019-03-25 | Discharge: 2019-03-25 | Disposition: A | Discharge status: Home / Self Care | Payer: Medicaid Other | Source: Ambulatory Visit | Attending: Surgery | Admitting: Surgery

## 2019-03-25 ENCOUNTER — Other Ambulatory Visit: Payer: Self-pay

## 2019-03-25 ENCOUNTER — Encounter (HOSPITAL_COMMUNITY): Payer: Self-pay

## 2019-03-25 DIAGNOSIS — Z932 Ileostomy status: Secondary | ICD-10-CM | POA: Diagnosis not present

## 2019-03-25 DIAGNOSIS — Z01812 Encounter for preprocedural laboratory examination: Secondary | ICD-10-CM | POA: Diagnosis not present

## 2019-03-25 HISTORY — DX: Personal history of antineoplastic chemotherapy: Z92.21

## 2019-03-25 HISTORY — DX: Personal history of other medical treatment: Z92.89

## 2019-03-25 LAB — CBC WITH DIFFERENTIAL/PLATELET
Abs Immature Granulocytes: 0.03 10*3/uL (ref 0.00–0.07)
Basophils Absolute: 0 10*3/uL (ref 0.0–0.1)
Basophils Relative: 0 %
Eosinophils Absolute: 0.2 10*3/uL (ref 0.0–0.5)
Eosinophils Relative: 3 %
HCT: 37.6 % (ref 36.0–46.0)
Hemoglobin: 11.1 g/dL — ABNORMAL LOW (ref 12.0–15.0)
Immature Granulocytes: 1 %
Lymphocytes Relative: 17 %
Lymphs Abs: 1.1 10*3/uL (ref 0.7–4.0)
MCH: 22 pg — ABNORMAL LOW (ref 26.0–34.0)
MCHC: 29.5 g/dL — ABNORMAL LOW (ref 30.0–36.0)
MCV: 74.5 fL — ABNORMAL LOW (ref 80.0–100.0)
Monocytes Absolute: 0.5 10*3/uL (ref 0.1–1.0)
Monocytes Relative: 8 %
Neutro Abs: 4.6 10*3/uL (ref 1.7–7.7)
Neutrophils Relative %: 71 %
Platelets: 216 10*3/uL (ref 150–400)
RBC: 5.05 MIL/uL (ref 3.87–5.11)
RDW: 16.4 % — ABNORMAL HIGH (ref 11.5–15.5)
WBC: 6.5 10*3/uL (ref 4.0–10.5)
nRBC: 0 % (ref 0.0–0.2)

## 2019-03-25 LAB — COMPREHENSIVE METABOLIC PANEL
ALT: 28 U/L (ref 0–44)
AST: 33 U/L (ref 15–41)
Albumin: 3.9 g/dL (ref 3.5–5.0)
Alkaline Phosphatase: 125 U/L (ref 38–126)
Anion gap: 9 (ref 5–15)
BUN: 13 mg/dL (ref 8–23)
CO2: 22 mmol/L (ref 22–32)
Calcium: 8.6 mg/dL — ABNORMAL LOW (ref 8.9–10.3)
Chloride: 110 mmol/L (ref 98–111)
Creatinine, Ser: 0.53 mg/dL (ref 0.44–1.00)
GFR calc Af Amer: >60 mL/min (ref >60)
GFR calc non Af Amer: >60 mL/min (ref >60)
Glucose, Bld: 107 mg/dL — ABNORMAL HIGH (ref 70–99)
Potassium: 4.1 mmol/L (ref 3.5–5.1)
Sodium: 141 mmol/L (ref 135–145)
Total Bilirubin: 0.4 mg/dL (ref 0.3–1.2)
Total Protein: 7.4 g/dL (ref 6.5–8.1)

## 2019-03-25 LAB — SURGICAL PCR SCREEN
MRSA, PCR: NEGATIVE
Staphylococcus aureus: NEGATIVE

## 2019-03-25 LAB — HEMOGLOBIN A1C
Hgb A1c MFr Bld: 5.3 % (ref 4.8–5.6)
Mean Plasma Glucose: 105.41 mg/dL

## 2019-03-26 MED ORDER — BUPIVACAINE LIPOSOME 1.3 % IJ SUSP
20.0000 mL | Freq: Once | INTRAMUSCULAR | Status: DC
  Filled 2019-03-26: qty 20

## 2019-03-27 ENCOUNTER — Encounter (HOSPITAL_COMMUNITY)
Admission: RE | Disposition: A | Discharge status: Home / Self Care | Payer: Self-pay | Source: Home / Self Care | Attending: Surgery

## 2019-03-27 ENCOUNTER — Inpatient Hospital Stay (HOSPITAL_COMMUNITY): Payer: Medicaid Other | Admitting: Certified Registered"

## 2019-03-27 ENCOUNTER — Other Ambulatory Visit: Payer: Self-pay

## 2019-03-27 ENCOUNTER — Inpatient Hospital Stay (HOSPITAL_COMMUNITY)
Admission: RE | Admit: 2019-03-27 | Discharge: 2019-03-29 | DRG: 330 | Disposition: A | Discharge status: Home / Self Care | Payer: Medicaid Other | Attending: Surgery | Admitting: Surgery

## 2019-03-27 ENCOUNTER — Encounter (HOSPITAL_COMMUNITY): Payer: Self-pay | Admitting: Emergency Medicine

## 2019-03-27 ENCOUNTER — Inpatient Hospital Stay (HOSPITAL_COMMUNITY): Payer: Medicaid Other | Admitting: Physician Assistant

## 2019-03-27 DIAGNOSIS — D62 Acute posthemorrhagic anemia: Secondary | ICD-10-CM | POA: Diagnosis not present

## 2019-03-27 DIAGNOSIS — Z85048 Personal history of other malignant neoplasm of rectum, rectosigmoid junction, and anus: Secondary | ICD-10-CM

## 2019-03-27 DIAGNOSIS — E876 Hypokalemia: Secondary | ICD-10-CM | POA: Diagnosis not present

## 2019-03-27 DIAGNOSIS — Z9049 Acquired absence of other specified parts of digestive tract: Secondary | ICD-10-CM | POA: Diagnosis not present

## 2019-03-27 DIAGNOSIS — D509 Iron deficiency anemia, unspecified: Secondary | ICD-10-CM | POA: Diagnosis not present

## 2019-03-27 DIAGNOSIS — D638 Anemia in other chronic diseases classified elsewhere: Secondary | ICD-10-CM | POA: Diagnosis present

## 2019-03-27 DIAGNOSIS — K660 Peritoneal adhesions (postprocedural) (postinfection): Secondary | ICD-10-CM | POA: Diagnosis not present

## 2019-03-27 DIAGNOSIS — Z9889 Other specified postprocedural states: Secondary | ICD-10-CM | POA: Diagnosis present

## 2019-03-27 DIAGNOSIS — Z20828 Contact with and (suspected) exposure to other viral communicable diseases: Secondary | ICD-10-CM | POA: Diagnosis not present

## 2019-03-27 DIAGNOSIS — Z432 Encounter for attention to ileostomy: Principal | ICD-10-CM

## 2019-03-27 DIAGNOSIS — Z79899 Other long term (current) drug therapy: Secondary | ICD-10-CM

## 2019-03-27 DIAGNOSIS — C20 Malignant neoplasm of rectum: Secondary | ICD-10-CM | POA: Diagnosis not present

## 2019-03-27 HISTORY — PX: ILEOSTOMY CLOSURE: SHX1784

## 2019-03-27 SURGERY — CLOSURE, ILEOSTOMY
Anesthesia: General | Site: Abdomen

## 2019-03-27 MED ORDER — PROPOFOL 10 MG/ML IV BOLUS
INTRAVENOUS | Status: DC | PRN
  Administered 2019-03-27: 80 mg via INTRAVENOUS

## 2019-03-27 MED ORDER — ONDANSETRON HCL 4 MG/2ML IJ SOLN: 4.0000 mg | Freq: Four times a day (QID) | INTRAMUSCULAR | Status: DC | PRN

## 2019-03-27 MED ORDER — FENTANYL CITRATE (PF) 100 MCG/2ML IJ SOLN
INTRAMUSCULAR | Status: AC
  Filled 2019-03-27: qty 2

## 2019-03-27 MED ORDER — EPHEDRINE SULFATE-NACL 50-0.9 MG/10ML-% IV SOSY
PREFILLED_SYRINGE | INTRAVENOUS | Status: DC | PRN
  Administered 2019-03-27: 5 mg via INTRAVENOUS

## 2019-03-27 MED ORDER — BUPIVACAINE-EPINEPHRINE (PF) 0.25% -1:200000 IJ SOLN
INTRAMUSCULAR | Status: AC
  Filled 2019-03-27: qty 30

## 2019-03-27 MED ORDER — LIDOCAINE 2% (20 MG/ML) 5 ML SYRINGE
INTRAMUSCULAR | Status: DC | PRN
  Administered 2019-03-27: 50 mg via INTRAVENOUS

## 2019-03-27 MED ORDER — OXYCODONE HCL 5 MG PO TABS: 5.0000 mg | ORAL_TABLET | Freq: Once | ORAL | Status: DC | PRN

## 2019-03-27 MED ORDER — SODIUM CHLORIDE 0.9 % IV SOLN
2.0000 g | INTRAVENOUS | Status: AC
  Administered 2019-03-27: 2 g via INTRAVENOUS
  Filled 2019-03-27: qty 2

## 2019-03-27 MED ORDER — ONDANSETRON HCL 4 MG/2ML IJ SOLN
INTRAMUSCULAR | Status: AC
  Filled 2019-03-27: qty 2

## 2019-03-27 MED ORDER — SACCHAROMYCES BOULARDII 250 MG PO CAPS
250.0000 mg | ORAL_CAPSULE | Freq: Two times a day (BID) | ORAL | Status: DC
  Administered 2019-03-27 – 2019-03-29 (×4): 250 mg via ORAL
  Filled 2019-03-27 (×4): qty 1

## 2019-03-27 MED ORDER — ROCURONIUM BROMIDE 100 MG/10ML IV SOLN
INTRAVENOUS | Status: DC | PRN
  Administered 2019-03-27: 30 mg via INTRAVENOUS

## 2019-03-27 MED ORDER — GABAPENTIN 300 MG PO CAPS
300.0000 mg | ORAL_CAPSULE | ORAL | Status: AC
  Administered 2019-03-27: 300 mg via ORAL
  Filled 2019-03-27: qty 1

## 2019-03-27 MED ORDER — DEXAMETHASONE SODIUM PHOSPHATE 10 MG/ML IJ SOLN
INTRAMUSCULAR | Status: DC | PRN
  Administered 2019-03-27: 5 mg via INTRAVENOUS

## 2019-03-27 MED ORDER — HYDROMORPHONE HCL 1 MG/ML IJ SOLN: 0.5000 mg | INTRAMUSCULAR | Status: DC | PRN

## 2019-03-27 MED ORDER — ONDANSETRON HCL 4 MG PO TABS: 4.0000 mg | ORAL_TABLET | Freq: Four times a day (QID) | ORAL | Status: DC | PRN

## 2019-03-27 MED ORDER — METOCLOPRAMIDE HCL 5 MG/ML IJ SOLN: 10.0000 mg | Freq: Four times a day (QID) | INTRAMUSCULAR | Status: DC | PRN

## 2019-03-27 MED ORDER — HEPARIN SODIUM (PORCINE) 5000 UNIT/ML IJ SOLN
5000.0000 [IU] | Freq: Once | INTRAMUSCULAR | Status: AC
  Administered 2019-03-27: 5000 [IU] via SUBCUTANEOUS
  Filled 2019-03-27: qty 1

## 2019-03-27 MED ORDER — SUGAMMADEX SODIUM 200 MG/2ML IV SOLN
INTRAVENOUS | Status: DC | PRN
  Administered 2019-03-27: 100 mg via INTRAVENOUS

## 2019-03-27 MED ORDER — CHLORHEXIDINE GLUCONATE 4 % EX LIQD: 60.0000 mL | Freq: Once | CUTANEOUS | Status: DC

## 2019-03-27 MED ORDER — DIPHENHYDRAMINE HCL 50 MG/ML IJ SOLN: 12.5000 mg | Freq: Four times a day (QID) | INTRAMUSCULAR | Status: DC | PRN

## 2019-03-27 MED ORDER — ONDANSETRON HCL 4 MG/2ML IJ SOLN
INTRAMUSCULAR | Status: DC | PRN
  Administered 2019-03-27: 4 mg via INTRAVENOUS

## 2019-03-27 MED ORDER — FENTANYL CITRATE (PF) 100 MCG/2ML IJ SOLN
INTRAMUSCULAR | Status: DC | PRN
  Administered 2019-03-27 (×3): 50 ug via INTRAVENOUS

## 2019-03-27 MED ORDER — ENSURE SURGERY PO LIQD
237.0000 mL | Freq: Two times a day (BID) | ORAL | Status: DC
  Administered 2019-03-28 – 2019-03-29 (×3): 237 mL via ORAL
  Filled 2019-03-27 (×4): qty 237

## 2019-03-27 MED ORDER — LACTATED RINGERS IV SOLN
INTRAVENOUS | Status: DC
  Administered 2019-03-27: 07:00:00 via INTRAVENOUS

## 2019-03-27 MED ORDER — 0.9 % SODIUM CHLORIDE (POUR BTL) OPTIME
TOPICAL | Status: DC | PRN
  Administered 2019-03-27: 1000 mL

## 2019-03-27 MED ORDER — MIDAZOLAM HCL 2 MG/2ML IJ SOLN
INTRAMUSCULAR | Status: AC
  Filled 2019-03-27: qty 2

## 2019-03-27 MED ORDER — BUPIVACAINE LIPOSOME 1.3 % IJ SUSP
INTRAMUSCULAR | Status: DC | PRN
  Administered 2019-03-27: 16.5 mL

## 2019-03-27 MED ORDER — LIDOCAINE 2% (20 MG/ML) 5 ML SYRINGE
INTRAMUSCULAR | Status: AC
  Filled 2019-03-27: qty 5

## 2019-03-27 MED ORDER — FENTANYL CITRATE (PF) 250 MCG/5ML IJ SOLN
INTRAMUSCULAR | Status: AC
  Filled 2019-03-27: qty 5

## 2019-03-27 MED ORDER — DIPHENHYDRAMINE HCL 12.5 MG/5ML PO ELIX: 12.5000 mg | ORAL_SOLUTION | Freq: Four times a day (QID) | ORAL | Status: DC | PRN

## 2019-03-27 MED ORDER — GABAPENTIN 300 MG PO CAPS
300.0000 mg | ORAL_CAPSULE | Freq: Two times a day (BID) | ORAL | Status: DC
  Administered 2019-03-27 – 2019-03-29 (×4): 300 mg via ORAL
  Filled 2019-03-27 (×4): qty 1

## 2019-03-27 MED ORDER — OXYCODONE HCL 5 MG PO TABS: 5.0000 mg | ORAL_TABLET | Freq: Four times a day (QID) | ORAL | Status: DC | PRN

## 2019-03-27 MED ORDER — ALVIMOPAN 12 MG PO CAPS
12.0000 mg | ORAL_CAPSULE | Freq: Two times a day (BID) | ORAL | Status: DC
  Administered 2019-03-28: 12 mg via ORAL
  Filled 2019-03-27: qty 1

## 2019-03-27 MED ORDER — ALUM & MAG HYDROXIDE-SIMETH 200-200-20 MG/5ML PO SUSP: 30.0000 mL | Freq: Four times a day (QID) | ORAL | Status: DC | PRN

## 2019-03-27 MED ORDER — OXYCODONE HCL 5 MG/5ML PO SOLN: 5.0000 mg | Freq: Once | ORAL | Status: DC | PRN

## 2019-03-27 MED ORDER — ENOXAPARIN SODIUM 40 MG/0.4ML ~~LOC~~ SOLN
40.0000 mg | SUBCUTANEOUS | Status: DC
  Administered 2019-03-28: 40 mg via SUBCUTANEOUS
  Filled 2019-03-27: qty 0.4

## 2019-03-27 MED ORDER — EPHEDRINE 5 MG/ML INJ
INTRAVENOUS | Status: AC
  Filled 2019-03-27: qty 10

## 2019-03-27 MED ORDER — FENTANYL CITRATE (PF) 100 MCG/2ML IJ SOLN
25.0000 ug | INTRAMUSCULAR | Status: DC | PRN
  Administered 2019-03-27 (×2): 25 ug via INTRAVENOUS

## 2019-03-27 MED ORDER — ACETAMINOPHEN 500 MG PO TABS
1000.0000 mg | ORAL_TABLET | Freq: Four times a day (QID) | ORAL | Status: DC
  Administered 2019-03-27 – 2019-03-29 (×7): 1000 mg via ORAL
  Filled 2019-03-27 (×8): qty 2

## 2019-03-27 MED ORDER — BUPIVACAINE-EPINEPHRINE (PF) 0.25% -1:200000 IJ SOLN
INTRAMUSCULAR | Status: DC | PRN
  Administered 2019-03-27: 16.5 mL

## 2019-03-27 MED ORDER — ROCURONIUM BROMIDE 10 MG/ML (PF) SYRINGE
PREFILLED_SYRINGE | INTRAVENOUS | Status: AC
  Filled 2019-03-27: qty 10

## 2019-03-27 MED ORDER — ALVIMOPAN 12 MG PO CAPS
12.0000 mg | ORAL_CAPSULE | ORAL | Status: AC
  Administered 2019-03-27: 12 mg via ORAL
  Filled 2019-03-27: qty 1

## 2019-03-27 MED ORDER — KETOROLAC TROMETHAMINE 15 MG/ML IJ SOLN: 15.0000 mg | Freq: Four times a day (QID) | INTRAMUSCULAR | Status: DC | PRN

## 2019-03-27 MED ORDER — SODIUM CHLORIDE 0.9 % IV SOLN
2.0000 g | Freq: Two times a day (BID) | INTRAVENOUS | Status: AC
  Filled 2019-03-27: qty 2

## 2019-03-27 MED ORDER — DEXAMETHASONE SODIUM PHOSPHATE 10 MG/ML IJ SOLN
INTRAMUSCULAR | Status: AC
  Filled 2019-03-27: qty 1

## 2019-03-27 MED ORDER — ACETAMINOPHEN 500 MG PO TABS
1000.0000 mg | ORAL_TABLET | ORAL | Status: AC
  Administered 2019-03-27: 1000 mg via ORAL
  Filled 2019-03-27: qty 2

## 2019-03-27 MED ORDER — MIDAZOLAM HCL 5 MG/5ML IJ SOLN
INTRAMUSCULAR | Status: DC | PRN
  Administered 2019-03-27: 1 mg via INTRAVENOUS

## 2019-03-27 MED ORDER — SODIUM CHLORIDE 0.9 % IV SOLN
INTRAVENOUS | Status: DC
  Administered 2019-03-27 (×2): via INTRAVENOUS

## 2019-03-27 SURGICAL SUPPLY — 51 items
APL PRP STRL LF DISP 70% ISPRP (MISCELLANEOUS) ×1
BLADE HEX COATED 2.75 (ELECTRODE) ×3 IMPLANT
CHLORAPREP W/TINT 26 (MISCELLANEOUS) ×3 IMPLANT
COVER MAYO STAND STRL (DRAPES) ×3 IMPLANT
COVER WAND RF STERILE (DRAPES) IMPLANT
DRAPE LAPAROSCOPIC ABDOMINAL (DRAPES) ×3 IMPLANT
DRAPE UTILITY XL STRL (DRAPES) IMPLANT
DRAPE WARM FLUID 44X44 (DRAPES) ×3 IMPLANT
DRSG OPSITE POSTOP 4X10 (GAUZE/BANDAGES/DRESSINGS) IMPLANT
DRSG OPSITE POSTOP 4X6 (GAUZE/BANDAGES/DRESSINGS) IMPLANT
DRSG OPSITE POSTOP 4X8 (GAUZE/BANDAGES/DRESSINGS) IMPLANT
DRSG TEGADERM 4X4.75 (GAUZE/BANDAGES/DRESSINGS) ×2 IMPLANT
DRSG TELFA 3X8 NADH (GAUZE/BANDAGES/DRESSINGS) IMPLANT
ELECT REM PT RETURN 15FT ADLT (MISCELLANEOUS) ×3 IMPLANT
GAUZE SPONGE 4X4 12PLY STRL (GAUZE/BANDAGES/DRESSINGS) ×3 IMPLANT
GLOVE BIO SURGEON STRL SZ 6 (GLOVE) ×6 IMPLANT
GLOVE BIO SURGEON STRL SZ 6.5 (GLOVE) ×4 IMPLANT
GLOVE BIO SURGEONS STRL SZ 6.5 (GLOVE) ×2
GLOVE INDICATOR 6.5 STRL GRN (GLOVE) ×3 IMPLANT
GOWN STRL REUS W/TWL LRG LVL3 (GOWN DISPOSABLE) ×6 IMPLANT
GOWN STRL REUS W/TWL XL LVL3 (GOWN DISPOSABLE) ×3 IMPLANT
HANDLE SUCTION POOLE (INSTRUMENTS) ×1 IMPLANT
HOLDER FOLEY CATH W/STRAP (MISCELLANEOUS) IMPLANT
KIT BASIN OR (CUSTOM PROCEDURE TRAY) ×3 IMPLANT
KIT TURNOVER KIT A (KITS) ×3 IMPLANT
MANIFOLD NEPTUNE II (INSTRUMENTS) ×3 IMPLANT
PACK GENERAL/GYN (CUSTOM PROCEDURE TRAY) ×3 IMPLANT
PAD DRESSING TELFA 3X8 NADH (GAUZE/BANDAGES/DRESSINGS) IMPLANT
RELOAD PROXIMATE 75MM BLUE (ENDOMECHANICALS) ×3 IMPLANT
RELOAD STAPLE 75 3.8 BLU REG (ENDOMECHANICALS) IMPLANT
SPONGE LAP 18X18 RF (DISPOSABLE) IMPLANT
STAPLER GUN LINEAR PROX 60 (STAPLE) ×2 IMPLANT
STAPLER PROXIMATE 75MM BLUE (STAPLE) ×2 IMPLANT
SUCTION POOLE HANDLE (INSTRUMENTS) ×3
SUT NOVA NAB DX-16 0-1 5-0 T12 (SUTURE) IMPLANT
SUT NOVA NAB GS-21 0 18 T12 DT (SUTURE) ×6 IMPLANT
SUT PROLENE 2 0 BLUE (SUTURE) IMPLANT
SUT SILK 2 0 (SUTURE) ×3
SUT SILK 2 0 SH CR/8 (SUTURE) ×3 IMPLANT
SUT SILK 2-0 18XBRD TIE 12 (SUTURE) ×1 IMPLANT
SUT SILK 3 0 (SUTURE) ×3
SUT SILK 3 0 SH CR/8 (SUTURE) ×3 IMPLANT
SUT SILK 3-0 18XBRD TIE 12 (SUTURE) ×1 IMPLANT
SUT VIC AB 2-0 SH 18 (SUTURE) IMPLANT
SUT VIC AB 2-0 SH 27 (SUTURE) ×6
SUT VIC AB 2-0 SH 27X BRD (SUTURE) ×2 IMPLANT
SUT VIC AB 4-0 PS2 18 (SUTURE) ×3 IMPLANT
TAPE CLOTH SURG 4X10 WHT LF (GAUZE/BANDAGES/DRESSINGS) ×2 IMPLANT
TOWEL OR 17X26 10 PK STRL BLUE (TOWEL DISPOSABLE) ×6 IMPLANT
TOWEL OR NON WOVEN STRL DISP B (DISPOSABLE) ×6 IMPLANT
YANKAUER SUCT BULB TIP NO VENT (SUCTIONS) ×3 IMPLANT

## 2019-03-27 NOTE — Anesthesia Procedure Notes (Signed)
Procedure Name: Intubation Date/Time: 03/27/2019 8:37 AM Performed by: Gwyndolyn Saxon, CRNA Pre-anesthesia Checklist: Patient identified, Emergency Drugs available, Suction available and Patient being monitored Patient Re-evaluated:Patient Re-evaluated prior to induction Oxygen Delivery Method: Circle system utilized Preoxygenation: Pre-oxygenation with 100% oxygen Induction Type: IV induction Ventilation: Mask ventilation without difficulty Laryngoscope Size: Miller and 2 Grade View: Grade II Tube type: Oral Tube size: 6.5 mm Number of attempts: 1 Airway Equipment and Method: Stylet Placement Confirmation: ETT inserted through vocal cords under direct vision,  positive ETCO2 and breath sounds checked- equal and bilateral Secured at: 20 cm Tube secured with: Tape Dental Injury: Teeth and Oropharynx as per pre-operative assessment

## 2019-03-27 NOTE — Transfer of Care (Signed)
Immediate Anesthesia Transfer of Care Note  Patient: Lorel Pantano  Procedure(s) Performed: REVERSAL OF LOOP ILEOSTOMY (N/A Abdomen)  Patient Location: PACU  Anesthesia Type:General  Level of Consciousness: drowsy  Airway & Oxygen Therapy: Patient Spontanous Breathing and Patient connected to face mask oxygen  Post-op Assessment: Report given to RN and Post -op Vital signs reviewed and stable  Post vital signs: Reviewed and stable  Last Vitals:  Vitals Value Taken Time  BP 149/79 03/27/19 1000  Temp    Pulse 72 03/27/19 1000  Resp 24 03/27/19 1000  SpO2 100 % 03/27/19 1000  Vitals shown include unvalidated device data.  Last Pain:  Vitals:   03/27/19 0652  TempSrc: Oral  PainSc: 0-No pain      Patients Stated Pain Goal: 4 (06/29/27 1188)  Complications: No apparent anesthesia complications

## 2019-03-27 NOTE — Anesthesia Preprocedure Evaluation (Signed)
Anesthesia Evaluation  Patient identified by MRN, date of birth, ID band Patient awake    Reviewed: Allergy & Precautions, H&P , NPO status , Patient's Chart, lab work & pertinent test results  Airway Mallampati: II   Neck ROM: full    Dental   Pulmonary neg pulmonary ROS,    breath sounds clear to auscultation       Cardiovascular negative cardio ROS   Rhythm:regular Rate:Normal     Neuro/Psych    GI/Hepatic H/o colon CA.   Endo/Other    Renal/GU      Musculoskeletal   Abdominal   Peds  Hematology  (+) Blood dyscrasia, anemia ,   Anesthesia Other Findings   Reproductive/Obstetrics                             Anesthesia Physical Anesthesia Plan  ASA: II  Anesthesia Plan: General   Post-op Pain Management:    Induction: Intravenous  PONV Risk Score and Plan: 3 and Ondansetron, Dexamethasone, Midazolam and Treatment may vary due to age or medical condition  Airway Management Planned: Oral ETT  Additional Equipment:   Intra-op Plan:   Post-operative Plan: Extubation in OR  Informed Consent: I have reviewed the patients History and Physical, chart, labs and discussed the procedure including the risks, benefits and alternatives for the proposed anesthesia with the patient or authorized representative who has indicated his/her understanding and acceptance.       Plan Discussed with: CRNA, Anesthesiologist and Surgeon  Anesthesia Plan Comments:         Anesthesia Quick Evaluation

## 2019-03-27 NOTE — Op Note (Signed)
Operative Note  Elizabeth Saunders  [id number]  [id number]  03/27/2019   Surgeon: Victorino Sparrow ConnorMD  Assistant: Leighton Ruff, MD  Procedure performed: Reversal of loop ileostomy  Preop diagnosis: Diverting loop ileostomy Post-op diagnosis/intraop findings: Same, intra-abdominal adhesions  Specimens: None Retained items: Packing in the wound which will be removed on postop day 1 EBL: Minimal cc Complications: none  Description of procedure: After obtaining informed consent the patient was taken to the operating room and placed supine on operating room table wheregeneral endotracheal anesthesia was initiated, preoperative antibiotics were administered, SCDs applied, and a formal timeout was performed.  The abdomen was prepped and draped in usual sterile fashion.  Cautery was used to incise the skin surrounding the ostomy and then a combination of cautery, blunt and sharp dissection were employed to free the bowel from adhesions to surrounding soft tissue, fascia and muscle.  The mobility of intra-abdominal component of the bowel was somewhat restricted by adhesions but we were able to bring up a sufficient amount to create our anastomosis.  Adhesions of surrounding loops of small bowel to the intra-abdominal wall were taken down bluntly and with Metzenbaums until there was sufficient clearance of the abdominal wall to close our fascial defect.  We then created our small bowel anastomosis using a 75 mm blue load linear cutting stapler.  The common enterotomy was closed with a TX 60 blue, in doing so one small area of bowel injury was excised.  The final anastomosis is about 3 cm wide, palpably patent and appears well perfused.  The small bowel anastomosis was then reduced into abdominal cavity.  We confirmed that the abdominal wall was sufficiently cleared off to close the fascia and then did so vertically using running single strand #1 PDS starting at either end and tying centrally.  Exparel mixed  with quarter percent Marcaine with epinephrine was infiltrated in the fascia and the soft tissues as well as the subcutaneous space.  Hemostasis was ensured within the wound.  The wound was then narrowed to about a 1.5 cm diameter with a pursestring 0 Vicryl in the deep dermal layer and packed with a saline moistened 4 x 4.  Dry dressing was applied.  The patient was then awakened, extubated and taken to PACU in stable condition.   All counts were correct at the completion of the case.
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
## 2019-03-28 LAB — BASIC METABOLIC PANEL
Anion gap: 11 (ref 5–15)
BUN: 9 mg/dL (ref 8–23)
CO2: 20 mmol/L — ABNORMAL LOW (ref 22–32)
Calcium: 8.4 mg/dL — ABNORMAL LOW (ref 8.9–10.3)
Chloride: 110 mmol/L (ref 98–111)
Creatinine, Ser: 0.53 mg/dL (ref 0.44–1.00)
GFR calc Af Amer: >60 mL/min (ref >60)
GFR calc non Af Amer: >60 mL/min (ref >60)
Glucose, Bld: 131 mg/dL — ABNORMAL HIGH (ref 70–99)
Potassium: 3.6 mmol/L (ref 3.5–5.1)
Sodium: 141 mmol/L (ref 135–145)

## 2019-03-28 LAB — CBC
HCT: 32.5 % — ABNORMAL LOW (ref 36.0–46.0)
Hemoglobin: 9.6 g/dL — ABNORMAL LOW (ref 12.0–15.0)
MCH: 21.8 pg — ABNORMAL LOW (ref 26.0–34.0)
MCHC: 29.5 g/dL — ABNORMAL LOW (ref 30.0–36.0)
MCV: 73.9 fL — ABNORMAL LOW (ref 80.0–100.0)
Platelets: 206 10*3/uL (ref 150–400)
RBC: 4.4 MIL/uL (ref 3.87–5.11)
RDW: 15.9 % — ABNORMAL HIGH (ref 11.5–15.5)
WBC: 10 10*3/uL (ref 4.0–10.5)
nRBC: 0 % (ref 0.0–0.2)

## 2019-03-28 MED ORDER — POTASSIUM CHLORIDE 20 MEQ PO PACK
40.0000 meq | PACK | Freq: Once | ORAL | Status: AC
  Administered 2019-03-28: 40 meq via ORAL
  Filled 2019-03-28: qty 2

## 2019-03-28 MED ORDER — DOCUSATE SODIUM 100 MG PO CAPS
100.0000 mg | ORAL_CAPSULE | Freq: Two times a day (BID) | ORAL | Status: DC
  Administered 2019-03-28: 100 mg via ORAL
  Filled 2019-03-28: qty 1

## 2019-03-28 NOTE — Progress Notes (Signed)
S: Slept OK. Minimal pain. Tolerating clears without nausea, no flatus or BM yet. Aside from scheduled tylenol/gabapentin, has taken no other pain or nausea meds.   Vitals, labs, intake/output, and orders reviewed at this time. No fever or tachycardia. 300PO, 900+ uop. Labs reviewed.   Gen: A&Ox3, no distress  H&N: EOMI, atraumatic, neck supple Chest: unlabored respirations, RRR Abd: soft, tender only inferolaterally to surgical site, nondistended, wound clean and dry with faint surrounding reactive erythema. Serosanguinous fluid on packing which is removed. Dry dressing applied. Ext: warm, no edema Neuro: grossly normal  Lines/tubes/drains: PIV  A/P:  POD 1 reversal of loop ileostomy -Replace potassium -Advance to full liquids -Ambulate, IS -Daily dressing changes to wound.  -Await return of bowel function.    [name], MD [facility], [place] Pager [phone]

## 2019-03-28 NOTE — Anesthesia Postprocedure Evaluation (Signed)
Anesthesia Post Note  Patient: Elizabeth Saunders  Procedure(s) Performed: REVERSAL OF LOOP ILEOSTOMY (N/A Abdomen)     Patient location during evaluation: PACU Anesthesia Type: General Level of consciousness: awake and alert Pain management: pain level controlled Vital Signs Assessment: post-procedure vital signs reviewed and stable Respiratory status: spontaneous breathing, nonlabored ventilation, respiratory function stable and patient connected to nasal cannula oxygen Cardiovascular status: blood pressure returned to baseline and stable Postop Assessment: no apparent nausea or vomiting Anesthetic complications: no    Last Vitals:  Vitals:   03/28/19 0138 03/28/19 0540  BP: 109/78 118/83  Pulse: (!) 59 (!) 57  Resp:  16  Temp: 36.8 C 36.9 C  SpO2: 100% 100%    Last Pain:  Vitals:   03/28/19 0540  TempSrc: Oral  PainSc:                  [place] S

## 2019-03-29 ENCOUNTER — Encounter (HOSPITAL_COMMUNITY): Payer: Self-pay | Admitting: Surgery

## 2019-03-29 LAB — BASIC METABOLIC PANEL
Anion gap: 8 (ref 5–15)
BUN: 9 mg/dL (ref 8–23)
CO2: 23 mmol/L (ref 22–32)
Calcium: 8.3 mg/dL — ABNORMAL LOW (ref 8.9–10.3)
Chloride: 112 mmol/L — ABNORMAL HIGH (ref 98–111)
Creatinine, Ser: 0.52 mg/dL (ref 0.44–1.00)
GFR calc Af Amer: >60 mL/min (ref >60)
GFR calc non Af Amer: >60 mL/min (ref >60)
Glucose, Bld: 103 mg/dL — ABNORMAL HIGH (ref 70–99)
Potassium: 3.3 mmol/L — ABNORMAL LOW (ref 3.5–5.1)
Sodium: 143 mmol/L (ref 135–145)

## 2019-03-29 LAB — MAGNESIUM: Magnesium: 2.1 mg/dL (ref 1.7–2.4)

## 2019-03-29 LAB — HEMOGLOBIN AND HEMATOCRIT, BLOOD
HCT: 33.7 % — ABNORMAL LOW (ref 36.0–46.0)
Hemoglobin: 9.9 g/dL — ABNORMAL LOW (ref 12.0–15.0)

## 2019-03-29 LAB — CBC
HCT: 29.9 % — ABNORMAL LOW (ref 36.0–46.0)
Hemoglobin: 8.8 g/dL — ABNORMAL LOW (ref 12.0–15.0)
MCH: 21.7 pg — ABNORMAL LOW (ref 26.0–34.0)
MCHC: 29.4 g/dL — ABNORMAL LOW (ref 30.0–36.0)
MCV: 73.8 fL — ABNORMAL LOW (ref 80.0–100.0)
Platelets: 180 10*3/uL (ref 150–400)
RBC: 4.05 MIL/uL (ref 3.87–5.11)
RDW: 16 % — ABNORMAL HIGH (ref 11.5–15.5)
WBC: 7 10*3/uL (ref 4.0–10.5)
nRBC: 0 % (ref 0.0–0.2)

## 2019-03-29 MED ORDER — METHOCARBAMOL 500 MG PO TABS: 500.0000 mg | ORAL_TABLET | Freq: Four times a day (QID) | ORAL | Status: DC | PRN

## 2019-03-29 MED ORDER — GABAPENTIN 300 MG PO CAPS: 300.0000 mg | ORAL_CAPSULE | Freq: Two times a day (BID) | ORAL | 0 refills | Status: DC

## 2019-03-29 MED ORDER — POTASSIUM CHLORIDE 10 MEQ/100ML IV SOLN
10.0000 meq | INTRAVENOUS | Status: AC
  Administered 2019-03-29 (×3): 10 meq via INTRAVENOUS
  Filled 2019-03-29 (×2): qty 100

## 2019-03-29 MED ORDER — TRAMADOL HCL 50 MG PO TABS: 50.0000 mg | ORAL_TABLET | Freq: Four times a day (QID) | ORAL | 0 refills | Status: DC | PRN

## 2019-03-29 MED ORDER — POTASSIUM CHLORIDE 20 MEQ PO PACK
40.0000 meq | PACK | Freq: Once | ORAL | Status: AC
  Administered 2019-03-29: 40 meq via ORAL
  Filled 2019-03-29: qty 2

## 2019-03-29 MED ORDER — HYDROMORPHONE HCL 1 MG/ML IJ SOLN: 0.5000 mg | Freq: Four times a day (QID) | INTRAMUSCULAR | Status: DC | PRN

## 2019-03-29 MED ORDER — TRAMADOL HCL 50 MG PO TABS: 50.0000 mg | ORAL_TABLET | Freq: Four times a day (QID) | ORAL | Status: DC | PRN

## 2019-03-29 MED ORDER — DOCUSATE SODIUM 100 MG PO CAPS: 100.0000 mg | ORAL_CAPSULE | Freq: Two times a day (BID) | ORAL | 0 refills | Status: DC

## 2019-03-29 NOTE — Discharge Instructions (Signed)
ABDOMINAL SURGERY: POST OP INSTRUCTIONS  1. DIET: Follow a light bland diet the first 24 hours after arrival home, such as soup, liquids, crackers, etc.  Be sure to include lots of fluids daily.  Avoid fast food or heavy meals as your are more likely to get nauseated.  Do not eat any uncooked fruits or vegetables for the next 2 weeks as your bowel heals. 2. Take your usually prescribed home medications unless otherwise directed. 3. PAIN CONTROL: a. Pain is best controlled by a usual combination of three different methods TOGETHER: i. Ice/Heat ii. Over the counter pain medication iii. Prescription pain medication b. Most patients will experience some swelling and bruising around the incisions.  Ice packs or heating pads (30-60 minutes up to 6 times a day) will help. Use ice for the first few days to help decrease swelling and bruising, then switch to heat to help relax tight/sore spots and speed recovery.  Some people prefer to use ice alone, heat alone, alternating between ice & heat.  Experiment to what works for you.  Swelling and bruising can take several weeks to resolve.   c. It is helpful to take an over-the-counter pain medication regularly for the first few weeks.  Choose one of the following that works best for you: i. Naproxen (Aleve, etc)  Two 220mg  tabs twice a day ii. Ibuprofen (Advil, etc) Three 200mg  tabs four times a day (every meal & bedtime) iii. Acetaminophen (Tylenol, etc) 500-650mg  four times a day (every meal & bedtime) d. A  prescription for pain medication (such as oxycodone, hydrocodone, etc) should be given to you upon discharge.  Take your pain medication as prescribed.  i. If you are having problems/concerns with the prescription medicine (does not control pain, nausea, vomiting, rash, itching, etc), please call us [phone] to see if we need to switch you to a different pain medicine that will work better for you and/or control your side effect better. ii. If you  need a refill on your pain medication, please contact your pharmacy.  They will contact our office to request authorization. Prescriptions will not be filled after 5 pm or on week-ends. 4. Avoid getting constipated.  Between the surgery and the pain medications, it is common to experience some constipation.  Increasing fluid intake and taking a fiber supplement (such as Metamucil, Citrucel, FiberCon, MiraLax, etc) 1-2 times a day regularly will usually help prevent this problem from occurring.  A mild laxative (prune juice, Milk of Magnesia, MiraLax, etc) should be taken according to package directions if there are no bowel movements after 48 hours.   5. Watch out for diarrhea.  If you have many loose bowel movements, simplify your diet to bland foods & liquids for a few days.  Stop any stool softeners and decrease your fiber supplement.  Switching to mild anti-diarrheal medications (Kayopectate, Pepto Bismol) can help.  If this worsens or does not improve, please call us. 6. Wash / shower every day.  You may shower over the incision / wound- just let the soap and water run over this area and pat dry.  Avoid baths, soaking or swimming until the skin is fully healed.  Continue to shower over incision(s) after the dressing is off. 7. Change the dry gauze dressing to your wound daily. If there is a lot of drainage, ok to change more than once daily to keep surrounding skin dry.   8. ACTIVITIES as tolerated:   a. You may resume regular (light) daily  activities beginning the next day--such as daily self-care, walking, climbing stairs--gradually increasing activities as tolerated.  If you can walk 30 minutes without difficulty, it is safe to try more intense activity such as jogging, treadmill, bicycling, low-impact aerobics, swimming, etc. b. Refrain from the most intensive and strenuous activity such as sit-ups, heavy lifting, contact sports, etc  Refrain from any heavy lifting or straining until 6 weeks after  surgery.   c. DO NOT PUSH THROUGH PAIN.  Let pain be your guide: If it hurts to do something, don't do it.  Pain is your body warning you to avoid that activity for another week until the pain goes down. d. You may drive when you are no longer taking prescription pain medication, you can comfortably wear a seatbelt, and you can safely maneuver your car and apply brakes. e. Dennis Bast may have sexual intercourse when it is comfortable.  9. FOLLOW UP in our office a. Follow up with Dr. Kae Heller in 2 weeks- you have an appointment on 04/10/19 at 1:30pm. If you need to change your appointment please call the office. b. Make sure that you call for this appointment the day you arrive home to insure a convenient appointment time. 10. IF YOU HAVE DISABILITY OR FAMILY LEAVE FORMS, BRING THEM TO THE OFFICE FOR PROCESSING.  DO NOT GIVE THEM TO YOUR DOCTOR.   WHEN TO CALL us [phone]: 1. Poor pain control 2. Reactions / problems with new medications (rash/itching, nausea, etc)  3. Fever over 101.5 F (38.5 C) 4. Inability to urinate 5. Nausea and/or vomiting 6. Worsening swelling or bruising 7. Continued bleeding from incision. 8. Increased pain, redness, or drainage from the incision  The clinic staff is available to answer your questions during regular business hours (8:30am-5pm).  Please dont hesitate to call and ask to speak to one of our nurses for clinical concerns.   A surgeon from [facility] is always on call at the hospitals   If you have a medical emergency, go to the nearest emergency room or call 911.    [facility], [place], [place], [place], [place]  [address] ? MAIN: [phone] ? TOLL FREE: [phone] ? FAX [phone] [email or web address]

## 2019-03-29 NOTE — Plan of Care (Signed)
Patient lying in bed this morning; pain and nausea controlled. Has ambulated in the room. Will continue to monitor.

## 2019-03-29 NOTE — Plan of Care (Signed)
Reviewed discharge instructions with patient; copy given. IV removed. Patient ready for discharge.  

## 2019-03-29 NOTE — Discharge Summary (Signed)
Physician Discharge Summary  Patient ID: Elizabeth Saunders MRN: [id number] DOB/AGE: 1956-03-30 63 y.o.  Admit date: 03/27/2019 Discharge date: 03/30/2019  Admission Diagnoses: ileostomy  Discharge Diagnoses:  Active Problems:   Status post reversal of ileostomy   Discharged Condition: good  Hospital Course: she was admitted for routine postoperative care following reversal of loop ileostomy.  Her pain is well controlled without narcotic medication, and her diet was able to be advanced.  By the morning of postop day 2 she was having bowel function, tolerating a soft diet, and including the halls independently, and reported minimal pain.  She had remained afebrile with no tachycardia throughout her hospitalization.  Her white blood cell count was normal.  Hemoglobin was confirmed to be stable.  She was deemed stable for discharge home.  Consults: None  Significant Diagnostic Studies: labs: see epic  Treatments: surgery: reversal of loop ileostomy  Discharge Exam: Blood pressure 119/74, pulse 68, temperature 98.5 F (36.9 C), temperature source Oral, resp. rate 16, height 5\' 3"  (1.6 m), weight 53.6 kg, SpO2 100 %. see today's rounding note  Disposition:     Allergies as of 03/29/2019   No Known Allergies     Medication List    TAKE these medications   acetaminophen 500 MG tablet Commonly known as: TYLENOL Take 1,000 mg by mouth every 6 (six) hours as needed.   docusate sodium 100 MG capsule Commonly known as: COLACE Take 1 capsule (100 mg total) by mouth 2 (two) times daily. Ok to decrease to once a day or discontinue if having loose bowel movements   gabapentin 300 MG capsule Commonly known as: NEURONTIN Take 1 capsule (300 mg total) by mouth 2 (two) times daily for 4 days.   traMADol 50 MG tablet Commonly known as: ULTRAM Take 1 tablet (50 mg total) by mouth every 6 (six) hours as needed for severe pain (for pain not relieved by tylenol or ibuprofen).      Follow-up  Information    Clovis Riley, MD. Go on 04/10/2019.   Specialty: General Surgery Why: You have an appointment on Wednesday, August 12 at 1:30pm. PLease try to arrive 10-15 minutes early to fill out any paperwork. Contact information: [address] [place] [address] [phone]           Signed: ARTHI MCDONALD 03/30/2019, 6:57 AM

## 2019-03-29 NOTE — Progress Notes (Addendum)
S: Slept well. Minimal pain. Tolerating full liquids without nausea, reports bowel movements yesterday. Aside from scheduled tylenol/gabapentin, has taken no other pain or nausea meds. She walked the halls several times yesterday.  Vitals, labs, intake/output, and orders reviewed at this time. No fever or tachycardia. 1230 PO, 500+ 2x uop. BM x 2. Labs reviewed. Hypokalemia 3.3. WBC 7 (10 yesteray, 6.5 preop). Hgb 8.8 (9.6 yesterday, 11.1 preop), Plt 180 (206 yesterday, 216 preop))  Gen: A&Ox3, no distress  H&N: EOMI, atraumatic, neck supple Chest: unlabored respirations, RRR Abd: soft, tenderness with voluntary guarding inferolaterally & laterally to surgical site unchanged from yesterday, remainder of abdomen is soft and nontender; nondistended, wound clean and dry with faint surrounding reactive erythema. Serous fluid on gauze dressing. New dry dressing applied. Ext: warm, no edema Neuro: grossly normal  Lines/tubes/drains: PIV  A/P:  POD 2 reversal of loop ileostomy -Hypokalemia 3.3 - replace IV+PO -Advance to soft diet. Having bowel function and tolerating liquids well.  -Ambulate, IS -Daily dressing changes to wound.  -Acute blood loss anemia/ anemia of chronic disease- no clinical signs of bleeding. Suspect this is at least partially dilutional. Change from yesterday to today is mild. Hold lovenox, dc toradol (has not received any), recheck H&H this afternoon.  If hgb stable and tolerating soft diet/ continued bowel function, plan discharge this afternoon.   [name], MD [facility], [place] Pager [phone]

## 2019-07-18 ENCOUNTER — Encounter: Payer: Self-pay | Admitting: Internal Medicine

## 2019-07-18 ENCOUNTER — Telehealth: Payer: Self-pay | Admitting: *Deleted

## 2019-07-18 ENCOUNTER — Telehealth: Payer: Self-pay | Admitting: Oncology

## 2019-07-18 ENCOUNTER — Other Ambulatory Visit: Payer: Self-pay

## 2019-07-18 ENCOUNTER — Ambulatory Visit: Payer: Medicaid Other | Admitting: Internal Medicine

## 2019-07-18 VITALS — BP 137/78 | HR 92 | Temp 98.8°F | Ht 61.0 in | Wt 120.8 lb

## 2019-07-18 DIAGNOSIS — C19 Malignant neoplasm of rectosigmoid junction: Secondary | ICD-10-CM | POA: Diagnosis not present

## 2019-07-18 DIAGNOSIS — D508 Other iron deficiency anemias: Secondary | ICD-10-CM

## 2019-07-18 DIAGNOSIS — C20 Malignant neoplasm of rectum: Secondary | ICD-10-CM | POA: Diagnosis not present

## 2019-07-18 DIAGNOSIS — D509 Iron deficiency anemia, unspecified: Secondary | ICD-10-CM

## 2019-07-18 DIAGNOSIS — Z124 Encounter for screening for malignant neoplasm of cervix: Secondary | ICD-10-CM | POA: Insufficient documentation

## 2019-07-18 DIAGNOSIS — D638 Anemia in other chronic diseases classified elsewhere: Secondary | ICD-10-CM

## 2019-07-18 NOTE — Progress Notes (Signed)
   CC: Follow-up for colon cancer and anemia   HPI:[name] is a 63 y.o. female who presents for evaluation for recent colon cancer diagnosis and anemia. Please see individual problem based A/P for details.  Past Medical History:  Diagnosis Date  . Anemia 12/16/2017  . Cancer [facility])    colorectal cancer 2019  . History of blood transfusion   . History of chemotherapy    Review of Systems:   ROS negative except as per HPI.  Physical Exam: Vitals:   07/18/19 1027  BP: 137/78  Pulse: 92  Temp: 98.8 F (37.1 C)  TempSrc: Oral  SpO2: 100%  Weight: 120 lb 12.8 oz (54.8 kg)  Height: 5\' 1"  (1.549 m)   General: A/O x4, in no acute distress, afebrile, nondiaphoretic HEENT: PEERL, EMO intact Cardio: RRR, no mrg's  Pulmonary: CTA bilaterally, no wheezing or crackles  Abdomen: Bowel sounds normal, soft, nontender, surgical sites appear clean, no edema, no erythema, no purulent drainage MSK: BLE nontender, nonedematous Neuro: Alert, mild decrease sensation with tingling of the hands Psych: Appropriate affect, not depressed in appearance, engages well  Assessment & Plan:   See Encounters Tab for problem based charting.  Patient discussed with Dr. Daryll Drown

## 2019-07-18 NOTE — Telephone Encounter (Signed)
Scheduled appt per 11/19 sch message - pt son is aware of appt date and time

## 2019-07-18 NOTE — Assessment & Plan Note (Signed)
Cervical cancer screening: Patient agreed to this at her return in three months. Risks vs benefits explained.

## 2019-07-18 NOTE — Assessment & Plan Note (Addendum)
Anemia, microcytic: Etiology with initially felt to be most likely secondary to iron deficiency anemia from colorectal cancer.  However, there could be a degree of thalassemia or hemoglobin E present.  We will evaluate this with further work-up today. Hgb last noted at 9.9 with microcytosis present. Stable. Patient is able to ambulate without weakness or dyspnea. She feels well and is able to complete her ADL's.  Plan:  Hemoglobin electrophoresis ordered CBC ordered iron and ferritin ordered

## 2019-07-18 NOTE — Telephone Encounter (Addendum)
Calling to schedule f/u appointment with Dr. Benay Spice. Had ileostomy reversal in July. Per Dr. Benay Spice: F/U in December. Scheduling message sent.

## 2019-07-18 NOTE — Patient Instructions (Signed)
Please schedule a routine visit with a PAP at the same time.  FOLLOW-UP INSTRUCTIONS When: 3-4 months For: Routine visit and a PAP smear at that time What to bring: All of your medications   Please make certain to call your cancer doctors to schedule an appointment with them in the next few weeks to months.  Today we discussed your anemia, cancer history and need for cancer screening. I will notify you of the test results from today's evaluation when available to me.   Thank you for your visit to the [facility] today. If you have any questions or concerns at any time please call us at [phone].

## 2019-07-18 NOTE — Assessment & Plan Note (Signed)
See Iron deficiency anemia from same day.

## 2019-07-18 NOTE — Assessment & Plan Note (Addendum)
  Colon cancer, Stage IIIB-T3N1bM0: Noted in April 2019 to be a 13-15cm fungating mass on flex sig after being admitted for severe fatigue and profound microcytic anemia. S/p resection. Completed 8 cycles of FOLFOX and subsequently treated with chemoRT.  Initially underwent colostomy after resection.  This was reversed but due to difficulties ileostomy was performed.  She underwent reversal of her loop ileostomy on 03/27/2019.  She presents today for routine follow-up with her PCP.  Colon, segmental resection for tumor, Rectosigmoid - INVASIVE COLORECTAL ADENOCARCINOMA, 4.4 CM. - TUMOR EXTENDS INTO PERIRECTAL CONNECTIVE TISSUE. - MARGINS NOT INVOLVED. - METASTATIC CARCINOMA IN TWO OF SEVENTEEN LYMPH NODES (2/17) Sensation of tingling/numbness of the all upper extremity digits bilaterally that has improved since discontinuation of the chemoRT.  Plan: Follow-up with oncology and surgery, patient advised to call oncology to schedule today

## 2019-07-22 LAB — CBC WITH DIFFERENTIAL/PLATELET
Basophils Absolute: 0 10*3/uL (ref 0.0–0.2)
Basos: 1 %
EOS (ABSOLUTE): 0.2 10*3/uL (ref 0.0–0.4)
Eos: 3 %
Hematocrit: 37.3 % (ref 34.0–46.6)
Hemoglobin: 11.3 g/dL (ref 11.1–15.9)
Immature Grans (Abs): 0 10*3/uL (ref 0.0–0.1)
Immature Granulocytes: 0 %
Lymphocytes Absolute: 1.2 10*3/uL (ref 0.7–3.1)
Lymphs: 18 %
MCH: 21.4 pg — ABNORMAL LOW (ref 26.6–33.0)
MCHC: 30.3 g/dL — ABNORMAL LOW (ref 31.5–35.7)
MCV: 71 fL — ABNORMAL LOW (ref 79–97)
Monocytes Absolute: 0.5 10*3/uL (ref 0.1–0.9)
Monocytes: 8 %
Neutrophils Absolute: 4.5 10*3/uL (ref 1.4–7.0)
Neutrophils: 70 %
Platelets: 225 10*3/uL (ref 150–450)
RBC: 5.28 x10E6/uL (ref 3.77–5.28)
RDW: 16.8 % — ABNORMAL HIGH (ref 11.7–15.4)
WBC: 6.4 10*3/uL (ref 3.4–10.8)

## 2019-07-22 LAB — PROTEIN ELECTROPHORESIS, SERUM, WITH REFLEX
A/G Ratio: 1.2 (ref 0.7–1.7)
Albumin ELP: 4.2 g/dL (ref 2.9–4.4)
Alpha 1: 0.2 g/dL (ref 0.0–0.4)
Alpha 2: 0.6 g/dL (ref 0.4–1.0)
Beta: 1.2 g/dL (ref 0.7–1.3)
Gamma Globulin: 1.5 g/dL (ref 0.4–1.8)
Globulin, Total: 3.5 g/dL (ref 2.2–3.9)
Total Protein: 7.7 g/dL (ref 6.0–8.5)

## 2019-07-22 LAB — BMP8+ANION GAP
Anion Gap: 18 mmol/L (ref 10.0–18.0)
BUN/Creatinine Ratio: 20 (ref 12–28)
BUN: 11 mg/dL (ref 8–27)
CO2: 18 mmol/L — ABNORMAL LOW (ref 20–29)
Calcium: 9.5 mg/dL (ref 8.7–10.3)
Chloride: 105 mmol/L (ref 96–106)
Creatinine, Ser: 0.56 mg/dL — ABNORMAL LOW (ref 0.57–1.00)
GFR calc Af Amer: 116 mL/min/{1.73_m2} (ref >59)
GFR calc non Af Amer: 101 mL/min/{1.73_m2} (ref >59)
Glucose: 97 mg/dL (ref 65–99)
Potassium: 3.6 mmol/L (ref 3.5–5.2)
Sodium: 141 mmol/L (ref 134–144)

## 2019-07-22 LAB — FERRITIN: Ferritin: 20 ng/mL (ref 15–150)

## 2019-07-22 LAB — IRON: Iron: 73 ug/dL (ref 27–139)

## 2019-07-23 ENCOUNTER — Encounter: Payer: Self-pay | Admitting: Internal Medicine

## 2019-07-23 NOTE — Progress Notes (Signed)
Internal Medicine Clinic Attending  Case discussed with Dr. Harbrecht at the time of the visit.  We reviewed the resident's history and exam and pertinent patient test results.  I agree with the assessment, diagnosis, and plan of care documented in the resident's note.   

## 2019-08-19 ENCOUNTER — Ambulatory Visit: Payer: Medicaid Other | Admitting: Oncology

## 2019-08-20 ENCOUNTER — Other Ambulatory Visit: Payer: Self-pay

## 2019-08-20 ENCOUNTER — Inpatient Hospital Stay: Payer: Medicaid Other | Attending: Oncology | Admitting: Oncology

## 2019-08-20 VITALS — BP 143/68 | HR 74 | Temp 98.5°F | Resp 18 | Ht 61.0 in | Wt 122.4 lb

## 2019-08-20 DIAGNOSIS — Z85048 Personal history of other malignant neoplasm of rectum, rectosigmoid junction, and anus: Secondary | ICD-10-CM | POA: Insufficient documentation

## 2019-08-20 DIAGNOSIS — D509 Iron deficiency anemia, unspecified: Secondary | ICD-10-CM | POA: Diagnosis not present

## 2019-08-20 DIAGNOSIS — C20 Malignant neoplasm of rectum: Secondary | ICD-10-CM | POA: Diagnosis not present
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
## 2019-08-21 ENCOUNTER — Telehealth: Payer: Self-pay | Admitting: Oncology

## 2019-08-21 NOTE — Telephone Encounter (Signed)
Scheduled per los. Called with interpreter, left msg. Mailed printout  °

## 2019-08-26 IMAGING — CT CT CHEST W/ CM
2 of 4 series · 15 of 36 positions shown, 18 images · IV contrast (APPLIED)
Comparison: CT abdomen pelvis 12/16/2017.

CLINICAL DATA: Patient with recent colonic mass. Assess for
metastatic disease.

EXAM:
CT CHEST WITH CONTRAST
TECHNIQUE: Multidetector CT imaging of the chest was performed during
intravenous contrast administration.
CONTRAST:  75mL 15LEAA-1BB IOPAMIDOL (15LEAA-1BB) INJECTION 61%

[Series 4: chest w · axial · 0.61mm/px · z∈[+1287,+1553]mm · 12 of 155 slices shown, 15 images]
[im 11/155  mediastinal]
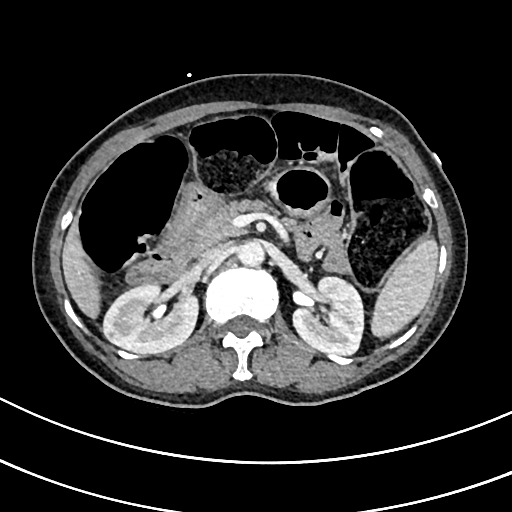
[im 11/155  lung]
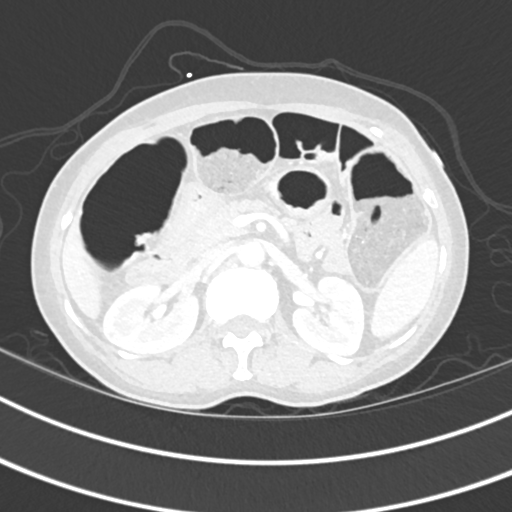
[im 21/155  lung]
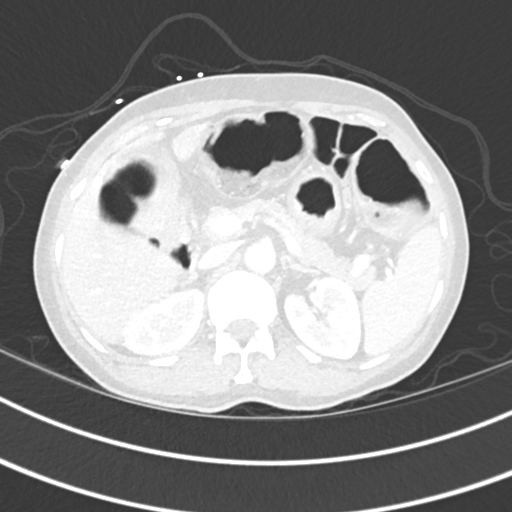
[im 31/155  lung]
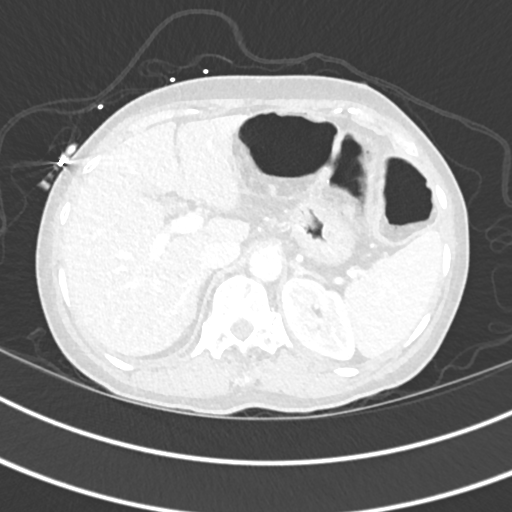
[im 52/155  lung]
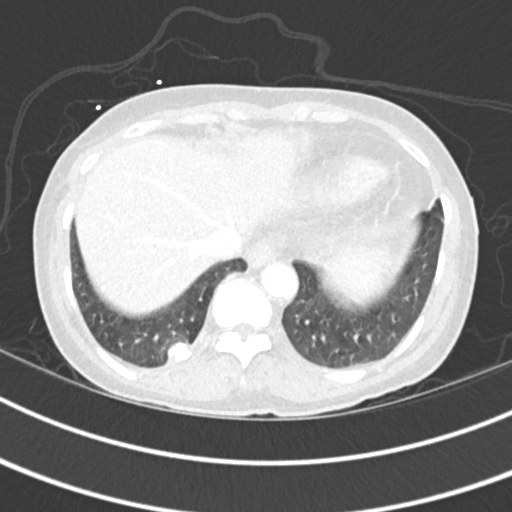
[im 62/155  mediastinal]
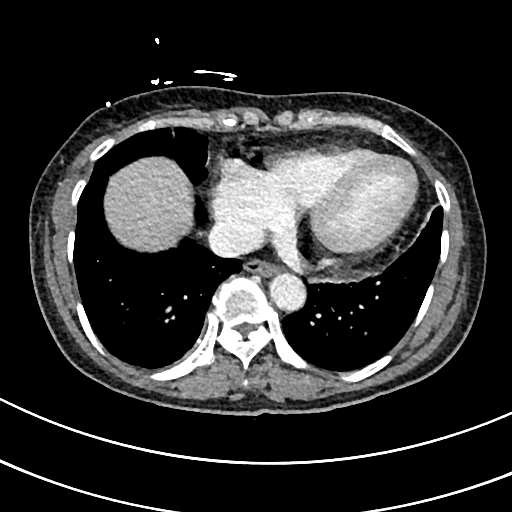
[im 62/155  lung]
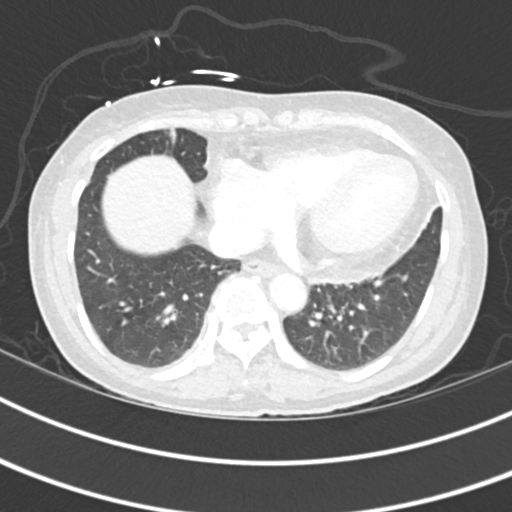
[im 72/155  lung]
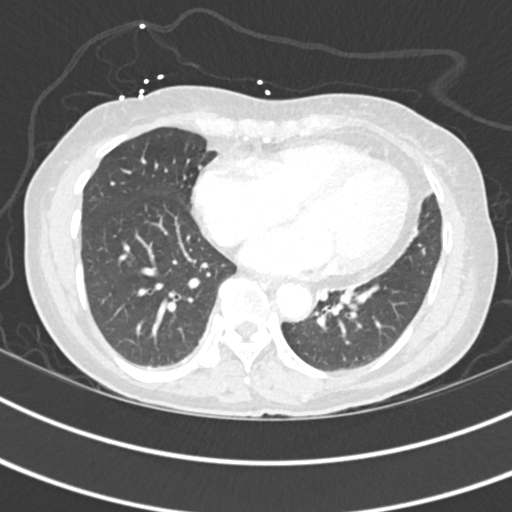
[im 83/155  lung]
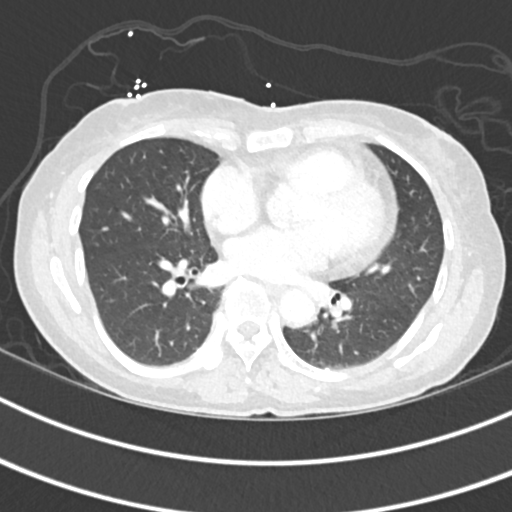
[im 93/155  lung]
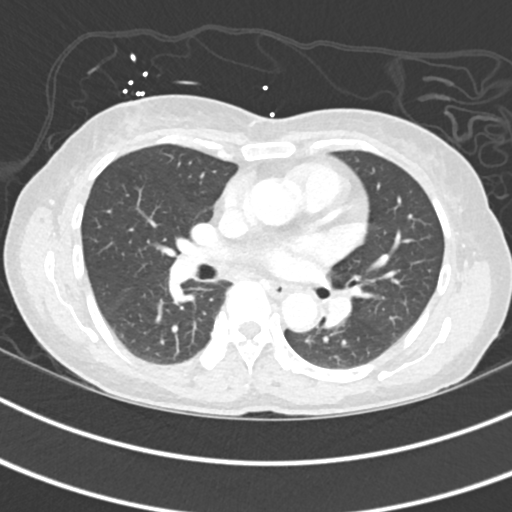
[im 103/155  mediastinal]
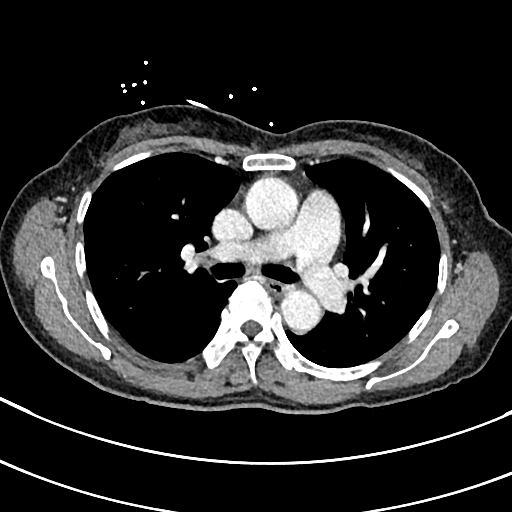
[im 103/155  lung]
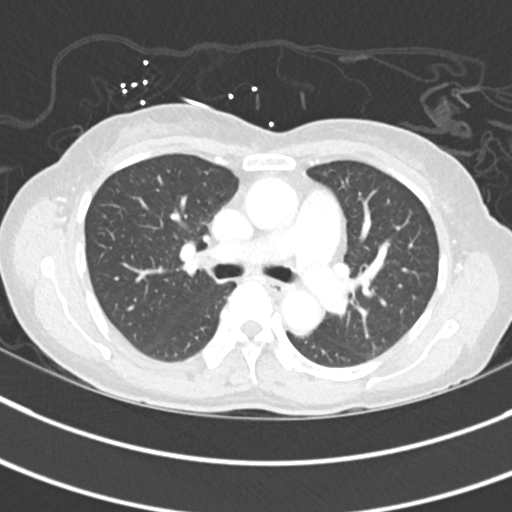
[im 124/155  lung]
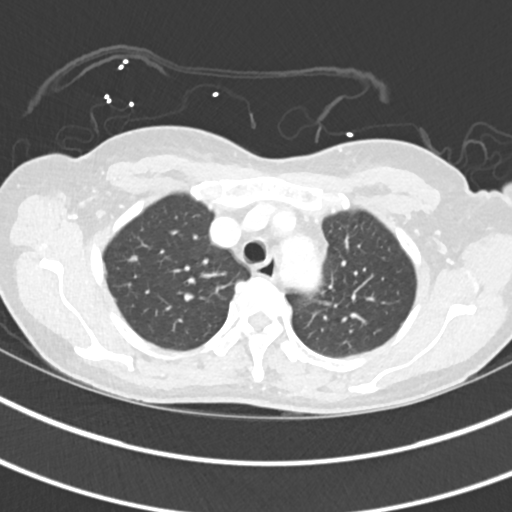
[im 134/155  lung]
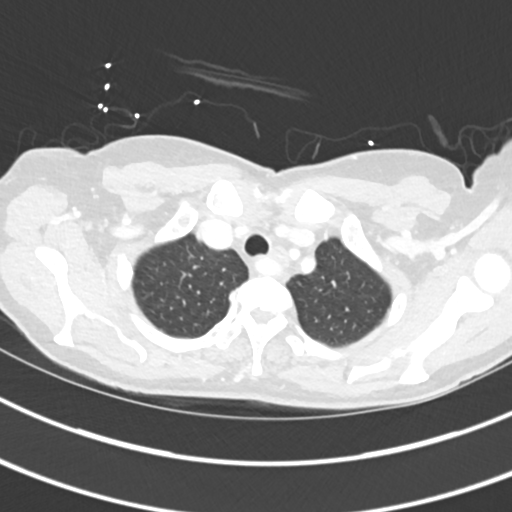
[im 144/155  lung]
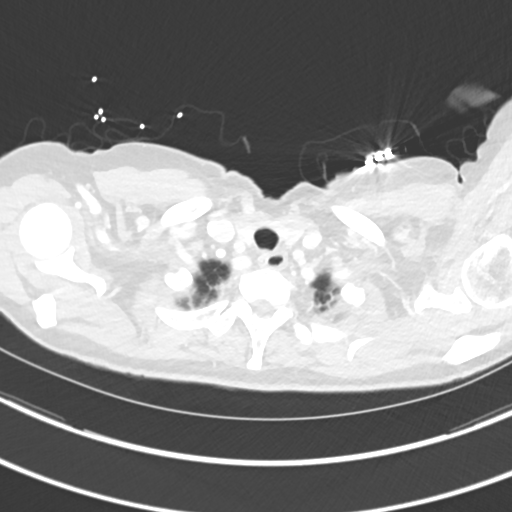

[Series 7: cor · coronal · 0.63mm/px · 3 of 96 slices shown]
[im 20/96  lung]
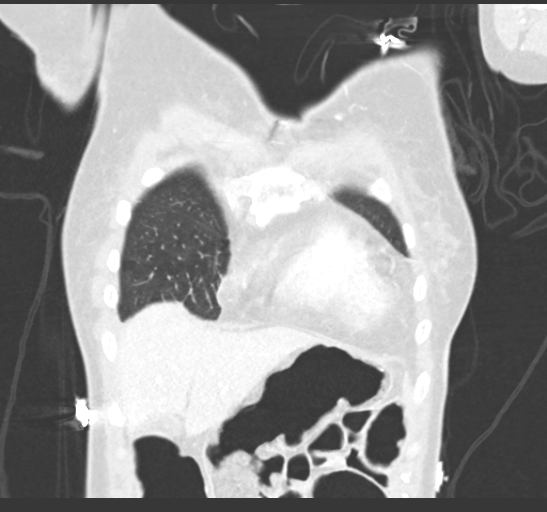
[im 39/96  lung]
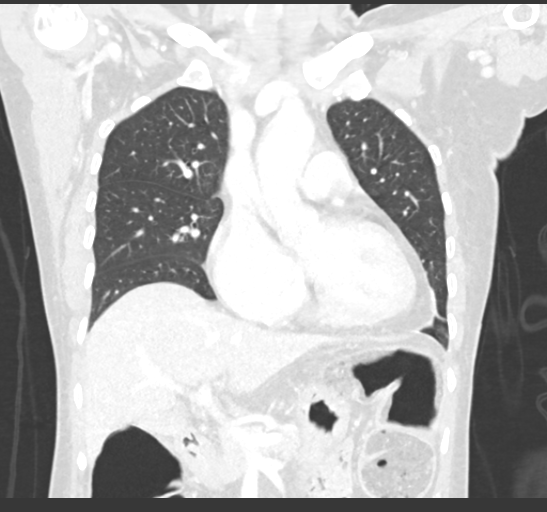
[im 58/96  lung]
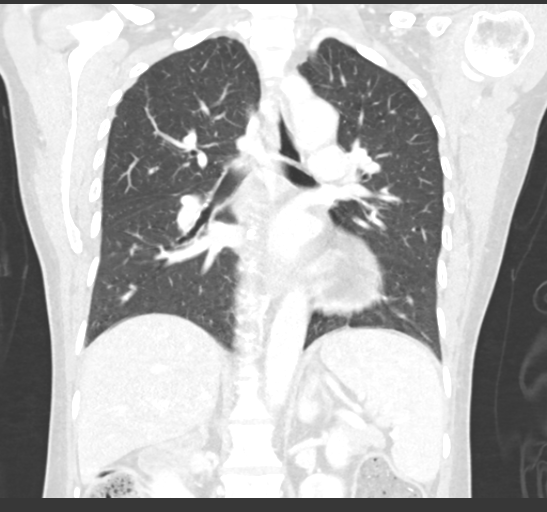

[15 of 36 positions shown; findings below may reference images not displayed]

FINDINGS: Cardiovascular: Heart is mildly enlarged. No pericardial effusion.
Thoracic aortic vascular calcifications.

Mediastinum/Nodes: No enlarged axillary, mediastinal or hilar
lymphadenopathy. Normal esophagus.

Lungs/Pleura: Central airways are patent. Dependent atelectasis
within the bilateral lower lobes. There is a 5 mm right upper lobe
nodule (image 32; series 6). No pleural effusion or pneumothorax.
Similar-appearing pleural-based calcification within the right lower
hemithorax, suggestive of benign etiology given stability over time
and appearance. Similar-appearing bandlike opacity right lower
hemithorax (image 110; series 6), favored to represent scarring.
Additional smaller pleural based calcification mid right hemithorax
(image 70; series 6). Bilateral small pleural based calcifications.

Upper Abdomen: No acute process. Subcentimeter too small to
characterize low-attenuation lesion interpolar region right kidney
(image 144; series 4).

Musculoskeletal: No aggressive or acute appearing osseous lesions.
IMPRESSION: 1. There is a 5 mm right upper lobe nodule, indeterminate. Recommend
attention on follow-up.
2. Bilateral pleural based calcifications, suggestive of prior
asbestos exposure.
3. Aortic Atherosclerosis ([id number]).

## 2019-10-30 ENCOUNTER — Encounter: Payer: Medicaid Other | Admitting: Internal Medicine

## 2019-12-16 ENCOUNTER — Telehealth: Payer: Self-pay | Admitting: Emergency Medicine

## 2019-12-16 ENCOUNTER — Inpatient Hospital Stay: Payer: Medicaid Other | Attending: Oncology

## 2019-12-16 ENCOUNTER — Ambulatory Visit (HOSPITAL_COMMUNITY)
Admission: RE | Admit: 2019-12-16 | Discharge: 2019-12-16 | Disposition: A | Discharge status: Home / Self Care | Payer: Medicaid Other | Source: Ambulatory Visit | Attending: Oncology | Admitting: Oncology

## 2019-12-16 ENCOUNTER — Other Ambulatory Visit: Payer: Self-pay

## 2019-12-16 DIAGNOSIS — C20 Malignant neoplasm of rectum: Secondary | ICD-10-CM | POA: Diagnosis not present

## 2019-12-16 DIAGNOSIS — C218 Malignant neoplasm of overlapping sites of rectum, anus and anal canal: Secondary | ICD-10-CM | POA: Diagnosis not present

## 2019-12-16 DIAGNOSIS — Z923 Personal history of irradiation: Secondary | ICD-10-CM | POA: Insufficient documentation

## 2019-12-16 DIAGNOSIS — Z9221 Personal history of antineoplastic chemotherapy: Secondary | ICD-10-CM | POA: Insufficient documentation

## 2019-12-16 DIAGNOSIS — Z85048 Personal history of other malignant neoplasm of rectum, rectosigmoid junction, and anus: Secondary | ICD-10-CM | POA: Diagnosis not present

## 2019-12-16 DIAGNOSIS — D509 Iron deficiency anemia, unspecified: Secondary | ICD-10-CM | POA: Diagnosis not present

## 2019-12-16 DIAGNOSIS — M545 Low back pain: Secondary | ICD-10-CM | POA: Diagnosis not present

## 2019-12-16 LAB — CEA (IN HOUSE-CHCC): CEA (CHCC-In House): 1.68 ng/mL (ref 0.00–5.00)

## 2019-12-16 LAB — BASIC METABOLIC PANEL - CANCER CENTER ONLY
Anion gap: 10 (ref 5–15)
BUN: 14 mg/dL (ref 8–23)
CO2: 21 mmol/L — ABNORMAL LOW (ref 22–32)
Calcium: 8.7 mg/dL — ABNORMAL LOW (ref 8.9–10.3)
Chloride: 110 mmol/L (ref 98–111)
Creatinine: 0.67 mg/dL (ref 0.44–1.00)
GFR, Est AFR Am: >60 mL/min (ref >60)
GFR, Estimated: >60 mL/min (ref >60)
Glucose, Bld: 90 mg/dL (ref 70–99)
Potassium: 3.6 mmol/L (ref 3.5–5.1)
Sodium: 141 mmol/L (ref 135–145)

## 2019-12-16 MED ORDER — IOHEXOL 300 MG/ML  SOLN
100.0000 mL | Freq: Once | INTRAMUSCULAR | Status: AC | PRN
  Administered 2019-12-16: 13:00:00 100 mL via INTRAVENOUS

## 2019-12-16 MED ORDER — SODIUM CHLORIDE (PF) 0.9 % IJ SOLN
INTRAMUSCULAR | Status: AC
  Filled 2019-12-16: qty 50

## 2019-12-16 NOTE — Telephone Encounter (Signed)
Received call report from [facility] Radiology for pt's CT scans today.  Verified that imaging results & impressions were visible in pt's chart.  Information given to MD Benay Spice for review.

## 2019-12-18 ENCOUNTER — Inpatient Hospital Stay (HOSPITAL_BASED_OUTPATIENT_CLINIC_OR_DEPARTMENT_OTHER): Payer: Medicaid Other | Admitting: Nurse Practitioner

## 2019-12-18 ENCOUNTER — Other Ambulatory Visit: Payer: Self-pay

## 2019-12-18 ENCOUNTER — Encounter: Payer: Self-pay | Admitting: Nurse Practitioner

## 2019-12-18 VITALS — BP 138/72 | HR 74 | Temp 98.2°F | Resp 17 | Ht 61.0 in | Wt 123.6 lb

## 2019-12-18 DIAGNOSIS — Z923 Personal history of irradiation: Secondary | ICD-10-CM | POA: Diagnosis not present

## 2019-12-18 DIAGNOSIS — Z9221 Personal history of antineoplastic chemotherapy: Secondary | ICD-10-CM | POA: Diagnosis not present

## 2019-12-18 DIAGNOSIS — C20 Malignant neoplasm of rectum: Secondary | ICD-10-CM | POA: Diagnosis not present

## 2019-12-18 DIAGNOSIS — D509 Iron deficiency anemia, unspecified: Secondary | ICD-10-CM | POA: Diagnosis not present

## 2019-12-18 DIAGNOSIS — M545 Low back pain: Secondary | ICD-10-CM | POA: Diagnosis not present

## 2019-12-18 DIAGNOSIS — Z85048 Personal history of other malignant neoplasm of rectum, rectosigmoid junction, and anus: Secondary | ICD-10-CM | POA: Diagnosis not present
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
## 2019-12-30 ENCOUNTER — Encounter: Payer: Self-pay | Admitting: *Deleted

## 2020-05-21 ENCOUNTER — Telehealth: Payer: Self-pay | Admitting: Oncology

## 2020-05-21 NOTE — Telephone Encounter (Signed)
Rescheduled appointment per provider pal schedule. Was unable to find interpreter to call patient, will mail patient updated calendar with appointment changes.

## 2020-06-11 ENCOUNTER — Telehealth: Payer: Self-pay | Admitting: Oncology

## 2020-06-11 NOTE — Telephone Encounter (Signed)
Rescheduled appointments per 10/14 scheduling message. Spoke to patient's son who is aware of updated appointment date and time.

## 2020-06-19 ENCOUNTER — Ambulatory Visit: Payer: Medicaid Other | Admitting: Oncology

## 2020-06-19 ENCOUNTER — Other Ambulatory Visit: Payer: Medicaid Other

## 2020-06-19 ENCOUNTER — Ambulatory Visit: Payer: Medicaid Other | Admitting: Nurse Practitioner

## 2020-07-03 ENCOUNTER — Telehealth: Payer: Self-pay | Admitting: Oncology

## 2020-07-03 ENCOUNTER — Other Ambulatory Visit: Payer: Self-pay

## 2020-07-03 ENCOUNTER — Inpatient Hospital Stay: Payer: Medicaid Other | Attending: Oncology

## 2020-07-03 ENCOUNTER — Inpatient Hospital Stay (HOSPITAL_BASED_OUTPATIENT_CLINIC_OR_DEPARTMENT_OTHER): Payer: Medicaid Other | Admitting: Oncology

## 2020-07-03 VITALS — BP 142/86 | HR 74 | Temp 98.1°F | Resp 16 | Ht 61.0 in | Wt 125.8 lb

## 2020-07-03 DIAGNOSIS — C20 Malignant neoplasm of rectum: Secondary | ICD-10-CM

## 2020-07-03 DIAGNOSIS — Z23 Encounter for immunization: Secondary | ICD-10-CM | POA: Diagnosis not present

## 2020-07-03 DIAGNOSIS — Z85048 Personal history of other malignant neoplasm of rectum, rectosigmoid junction, and anus: Secondary | ICD-10-CM | POA: Diagnosis present

## 2020-07-03 LAB — CEA (IN HOUSE-CHCC): CEA (CHCC-In House): 1.26 ng/mL (ref 0.00–5.00)

## 2020-07-03 MED ORDER — INFLUENZA VAC SPLIT QUAD 0.5 ML IM SUSY
PREFILLED_SYRINGE | INTRAMUSCULAR | Status: AC
  Filled 2020-07-03: qty 0.5

## 2020-07-03 MED ORDER — INFLUENZA VAC SPLIT QUAD 0.5 ML IM SUSY
0.5000 mL | PREFILLED_SYRINGE | Freq: Once | INTRAMUSCULAR | Status: AC
  Administered 2020-07-03: 0.5 mL via INTRAMUSCULAR

## 2020-07-03 NOTE — Telephone Encounter (Signed)
Scheduled appointments per 11/5 los. Mailed updated calendar to patient with appointments dates and times.
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
## 2020-12-15 ENCOUNTER — Other Ambulatory Visit: Payer: Self-pay

## 2020-12-15 ENCOUNTER — Inpatient Hospital Stay: Payer: Medicaid Other | Attending: Oncology

## 2020-12-15 DIAGNOSIS — Z85048 Personal history of other malignant neoplasm of rectum, rectosigmoid junction, and anus: Secondary | ICD-10-CM | POA: Diagnosis not present

## 2020-12-15 DIAGNOSIS — D509 Iron deficiency anemia, unspecified: Secondary | ICD-10-CM | POA: Diagnosis not present

## 2020-12-15 DIAGNOSIS — C20 Malignant neoplasm of rectum: Secondary | ICD-10-CM

## 2020-12-15 LAB — BASIC METABOLIC PANEL - CANCER CENTER ONLY
Anion gap: 11 (ref 5–15)
BUN: 12 mg/dL (ref 8–23)
CO2: 22 mmol/L (ref 22–32)
Calcium: 8.5 mg/dL — ABNORMAL LOW (ref 8.9–10.3)
Chloride: 109 mmol/L (ref 98–111)
Creatinine: 0.72 mg/dL (ref 0.44–1.00)
GFR, Estimated: >60 mL/min (ref >60)
Glucose, Bld: 118 mg/dL — ABNORMAL HIGH (ref 70–99)
Potassium: 3.7 mmol/L (ref 3.5–5.1)
Sodium: 142 mmol/L (ref 135–145)

## 2020-12-15 LAB — CEA (IN HOUSE-CHCC): CEA (CHCC-In House): 1.64 ng/mL (ref 0.00–5.00)

## 2020-12-16 ENCOUNTER — Ambulatory Visit (HOSPITAL_COMMUNITY)
Admission: RE | Admit: 2020-12-16 | Discharge: 2020-12-16 | Disposition: A | Discharge status: Home / Self Care | Payer: Medicaid Other | Source: Ambulatory Visit | Attending: Oncology | Admitting: Oncology

## 2020-12-16 ENCOUNTER — Encounter (HOSPITAL_COMMUNITY): Payer: Self-pay

## 2020-12-16 DIAGNOSIS — C20 Malignant neoplasm of rectum: Secondary | ICD-10-CM | POA: Insufficient documentation

## 2020-12-16 DIAGNOSIS — K8020 Calculus of gallbladder without cholecystitis without obstruction: Secondary | ICD-10-CM | POA: Diagnosis not present

## 2020-12-16 DIAGNOSIS — Z9049 Acquired absence of other specified parts of digestive tract: Secondary | ICD-10-CM | POA: Diagnosis not present

## 2020-12-16 DIAGNOSIS — Q278 Other specified congenital malformations of peripheral vascular system: Secondary | ICD-10-CM | POA: Diagnosis not present

## 2020-12-16 DIAGNOSIS — K760 Fatty (change of) liver, not elsewhere classified: Secondary | ICD-10-CM | POA: Diagnosis not present

## 2020-12-16 DIAGNOSIS — J948 Other specified pleural conditions: Secondary | ICD-10-CM | POA: Diagnosis not present

## 2020-12-16 DIAGNOSIS — C218 Malignant neoplasm of overlapping sites of rectum, anus and anal canal: Secondary | ICD-10-CM | POA: Diagnosis not present

## 2020-12-16 MED ORDER — IOHEXOL 300 MG/ML  SOLN
100.0000 mL | Freq: Once | INTRAMUSCULAR | Status: AC | PRN
  Administered 2020-12-16: 100 mL via INTRAVENOUS

## 2020-12-17 ENCOUNTER — Inpatient Hospital Stay (HOSPITAL_BASED_OUTPATIENT_CLINIC_OR_DEPARTMENT_OTHER): Payer: Medicaid Other | Admitting: Oncology

## 2020-12-17 ENCOUNTER — Other Ambulatory Visit: Payer: Self-pay

## 2020-12-17 VITALS — BP 153/87 | HR 77 | Temp 97.8°F | Resp 18 | Ht 61.0 in | Wt 128.2 lb

## 2020-12-17 DIAGNOSIS — C20 Malignant neoplasm of rectum: Secondary | ICD-10-CM | POA: Diagnosis not present

## 2020-12-17 DIAGNOSIS — Z85048 Personal history of other malignant neoplasm of rectum, rectosigmoid junction, and anus: Secondary | ICD-10-CM | POA: Diagnosis not present
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
## 2021-06-10 ENCOUNTER — Encounter: Payer: Self-pay | Admitting: Gastroenterology

## 2021-06-14 ENCOUNTER — Inpatient Hospital Stay: Payer: Medicaid Other

## 2021-06-14 ENCOUNTER — Other Ambulatory Visit: Payer: Self-pay

## 2021-06-14 ENCOUNTER — Encounter: Payer: Self-pay | Admitting: Nurse Practitioner

## 2021-06-14 ENCOUNTER — Inpatient Hospital Stay: Payer: Medicaid Other | Attending: Nurse Practitioner | Admitting: Nurse Practitioner

## 2021-06-14 VITALS — BP 145/76 | HR 77 | Temp 97.8°F | Resp 18 | Ht 61.0 in | Wt 128.0 lb

## 2021-06-14 DIAGNOSIS — D701 Agranulocytosis secondary to cancer chemotherapy: Secondary | ICD-10-CM | POA: Diagnosis not present

## 2021-06-14 DIAGNOSIS — C20 Malignant neoplasm of rectum: Secondary | ICD-10-CM

## 2021-06-14 DIAGNOSIS — G620 Drug-induced polyneuropathy: Secondary | ICD-10-CM | POA: Diagnosis not present

## 2021-06-14 DIAGNOSIS — C19 Malignant neoplasm of rectosigmoid junction: Secondary | ICD-10-CM | POA: Insufficient documentation

## 2021-06-14 DIAGNOSIS — D509 Iron deficiency anemia, unspecified: Secondary | ICD-10-CM | POA: Insufficient documentation

## 2021-06-14 LAB — CEA (ACCESS): CEA (CHCC): 1.87 ng/mL (ref 0.00–5.00)
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
## 2021-06-22 ENCOUNTER — Other Ambulatory Visit: Payer: Self-pay

## 2021-06-22 ENCOUNTER — Encounter: Payer: Self-pay | Admitting: Internal Medicine

## 2021-06-22 ENCOUNTER — Ambulatory Visit: Payer: Medicaid Other | Admitting: Internal Medicine

## 2021-06-22 VITALS — BP 146/77 | HR 65 | Temp 98.0°F | Ht 61.0 in | Wt 129.0 lb

## 2021-06-22 DIAGNOSIS — Z13220 Encounter for screening for lipoid disorders: Secondary | ICD-10-CM

## 2021-06-22 DIAGNOSIS — Z1239 Encounter for other screening for malignant neoplasm of breast: Secondary | ICD-10-CM | POA: Insufficient documentation

## 2021-06-22 DIAGNOSIS — R030 Elevated blood-pressure reading, without diagnosis of hypertension: Secondary | ICD-10-CM | POA: Diagnosis not present

## 2021-06-22 DIAGNOSIS — Z124 Encounter for screening for malignant neoplasm of cervix: Secondary | ICD-10-CM | POA: Diagnosis not present

## 2021-06-22 DIAGNOSIS — Z5941 Food insecurity: Secondary | ICD-10-CM

## 2021-06-22 DIAGNOSIS — D508 Other iron deficiency anemias: Secondary | ICD-10-CM

## 2021-06-22 DIAGNOSIS — Z1231 Encounter for screening mammogram for malignant neoplasm of breast: Secondary | ICD-10-CM

## 2021-06-22 DIAGNOSIS — R918 Other nonspecific abnormal finding of lung field: Secondary | ICD-10-CM | POA: Diagnosis not present

## 2021-06-22 DIAGNOSIS — D509 Iron deficiency anemia, unspecified: Secondary | ICD-10-CM

## 2021-06-22 NOTE — Assessment & Plan Note (Signed)
Patient reports some frustration and stress today.  Reports occasionally feels down given history of cancer and financial stress.  PHQ-9 0 today.  One of her concerns is being able to afford food.  Discussed with patient that we may be able to connect her with resources for food insecurity.  Plan: -Referral to care coordination for food insecurity

## 2021-06-22 NOTE — Assessment & Plan Note (Addendum)
Patient had CT scan completed on 11/2020 which showed diffuse fatty infiltrations of the liver. BMI 24. Patient has never had lipid panel completed.  Plan: -Lipid panel today to assess CVD risk

## 2021-06-22 NOTE — Assessment & Plan Note (Signed)
Patient is overdue for Pap smear.  No record of prior Pap smear completed.  Today we discussed cervical cancer screening and Pap smear procedure.  Plan: -Follow-up appointment 1 to 2 weeks for Pap smear, will need female interpreter

## 2021-06-22 NOTE — Assessment & Plan Note (Addendum)
Patient presents for routine health visit.  Patient has past medical history of IDA in the setting of colorectal cancer.  Last CBC 2020 with hemoglobin 11.3, MCV 71, MCHC 30.  Iron studies in 2020 normal with iron 73 and ferritin 20.  Possible other underlying process causing microcytosis include anemia of chronic disease, hemoglobin E or thalassemia variant.  Patient has not had CBC completed since 2020.  Colorectal cancer currently in remission.  Today patient denies shortness of breath and dizziness.  No signs of possible anemia on exam. Not currently on iron supplement.  Plan: -Repeat CBC today -Can consider repeating iron studies, further work-up of microcytic anemia, if patient continues to be anemic despite colorectal cancer remission

## 2021-06-22 NOTE — Assessment & Plan Note (Signed)
Patient is due for mammogram.  No record of prior mammogram completed.  Patient denies personal history of breast cancer.  Discussed breast cancer screening and mammogram procedure today.  Plan: -Screening mammogram ordered

## 2021-06-22 NOTE — Patient Instructions (Addendum)
Thank you, Ms.Nyjah Burgener for allowing [place] to provide your care today. Today we discussed:  Watery eyes when reading: Your eyes may be getting dry if you are concentrating on your phone screen for too long.Continue to wear your reading glasses and remember to take breaks in between reading or viewing on your phone.   Health maintenance: We will refer you for a mammogram. We will also schedule you for a pap smear in 1-2 weeks.   History of low blood counts: we will recheck your blood cell counts today, we will also check your cholesterol   Food: We have staff that can help with obtaining food when money is short. She will give you a call in the next 30 days.  Blood pressure: your blood pressure was elevated today in clinic at 150/82. Please try to take your blood pressure at home and record the pressures for [place] to review next visit.    I have ordered the following labs for you:   Lab Orders         Lipid Profile         CBC with Diff      I will call if any are abnormal. All of your labs can be accessed through "My Chart".  My Chart Access: [email or web address]  Please follow-up in 1-2 weeks for Pap smear  Please make sure to arrive 15 minutes prior to your next appointment. If you arrive late, you may be asked to reschedule.    We look forward to seeing you next time. Please call our clinic at [phone] if you have any questions or concerns. The best time to call is Monday-Friday from 9am-4pm, but there is someone available 24/7. If after hours or the weekend, call the main hospital number and ask for the Internal Medicine Resident On-Call. If you need medication refills, please notify your pharmacy one week in advance and they will send [place] a request.   Thank you for letting us take part in your care. Wishing you the best!  Wayland Denis, MD 06/22/2021, 9:55 AM IM Resident, PGY-1

## 2021-06-22 NOTE — Progress Notes (Signed)
  CC: routine health visit  HPI:  [name] is a 65 y.o. female with a past medical history stated below and presents today for routine health visit. Please see problem based assessment and plan for additional details.  Past Medical History:  Diagnosis Date   Anemia 12/16/2017   Cancer [facility])    colorectal cancer 2019   History of blood transfusion    History of chemotherapy     Current Outpatient Medications on File Prior to Visit  Medication Sig Dispense Refill   acetaminophen (TYLENOL) 500 MG tablet Take 1,000 mg by mouth every 6 (six) hours as needed.     No current facility-administered medications on file prior to visit.    Family History  Problem Relation Age of Onset   Colon cancer Neg Hx    Esophageal cancer Neg Hx    Rectal cancer Neg Hx    Stomach cancer Neg Hx     Social History: Patient has been out of work for 11 years, was denied disability a few months ago. Has food insecurity concerns, reports worries about affording food.  Review of Systems: ROS negative except for what is noted on the assessment and plan.  Vitals:   06/22/21 0908 06/22/21 0942  BP: (!) 150/82 (!) 146/77  Pulse: 72 65  Temp: 98 F (36.7 C)   TempSrc: Oral   SpO2: 100%   Weight: 129 lb (58.5 kg)   Height: 5\' 1"  (1.549 m)      Physical Exam: General: Well appearing, well nourished, asian female, NAD HENT: normocephalic, atraumatic EYES: conjunctiva non-erythematous, no scleral icterus CV: regular rate, normal rhythm, no murmurs, rubs, gallops. No LEE. Pulmonary: normal work of breathing on RA, lungs clear to auscultation, no rales, wheezes, rhonchi Abdominal: non-distended, soft, non-tender to palpation, normal BS Skin: Warm and dry, no rashes or lesions Neurological: MS: awake, alert and oriented x3, normal speech and fund of knowledge Motor: moves all extremities antigravity Psych: normal affect    Assessment & Plan:   See Encounters Tab for problem based  charting.  Patient discussed with Dr. Illene Regulus, M.D. [facility], PGY-1 Pager: [phone] Date 06/22/2021 Time 2:15 PM

## 2021-06-22 NOTE — Assessment & Plan Note (Signed)
Blood pressure today in triage 150/82. Repeat 146/77. Previous office visits with oncology Bps in 122U systolic.  Plan: -Patient encouraged to check BP at home with cuff or stop by pharmacy with public cuff to check blood pressures outside of hospital environment and record Bps -Follow up in 1-2wks for pap smear, will recheck BP at this time

## 2021-06-23 LAB — CBC WITH DIFFERENTIAL/PLATELET
Basophils Absolute: 0 10*3/uL (ref 0.0–0.2)
Basos: 0 %
EOS (ABSOLUTE): 0.2 10*3/uL (ref 0.0–0.4)
Eos: 3 %
Hematocrit: 38 % (ref 34.0–46.6)
Hemoglobin: 11.7 g/dL (ref 11.1–15.9)
Immature Grans (Abs): 0 10*3/uL (ref 0.0–0.1)
Immature Granulocytes: 0 %
Lymphocytes Absolute: 1.3 10*3/uL (ref 0.7–3.1)
Lymphs: 22 %
MCH: 21.7 pg — ABNORMAL LOW (ref 26.6–33.0)
MCHC: 30.8 g/dL — ABNORMAL LOW (ref 31.5–35.7)
MCV: 71 fL — ABNORMAL LOW (ref 79–97)
Monocytes Absolute: 0.5 10*3/uL (ref 0.1–0.9)
Monocytes: 8 %
Neutrophils Absolute: 3.9 10*3/uL (ref 1.4–7.0)
Neutrophils: 67 %
Platelets: 270 10*3/uL (ref 150–450)
RBC: 5.39 x10E6/uL — ABNORMAL HIGH (ref 3.77–5.28)
RDW: 16 % — ABNORMAL HIGH (ref 11.7–15.4)
WBC: 5.9 10*3/uL (ref 3.4–10.8)

## 2021-06-23 LAB — LIPID PANEL
Chol/HDL Ratio: 3.9 ratio (ref 0.0–4.4)
Cholesterol, Total: 177 mg/dL (ref 100–199)
HDL: 45 mg/dL (ref >39)
LDL Chol Calc (NIH): 111 mg/dL — ABNORMAL HIGH (ref 0–99)
Triglycerides: 118 mg/dL (ref 0–149)
VLDL Cholesterol Cal: 21 mg/dL (ref 5–40)

## 2021-07-01 ENCOUNTER — Telehealth: Payer: Self-pay

## 2021-07-01 ENCOUNTER — Other Ambulatory Visit: Payer: Self-pay

## 2021-07-01 ENCOUNTER — Ambulatory Visit: Payer: Medicaid Other | Admitting: Internal Medicine

## 2021-07-01 ENCOUNTER — Other Ambulatory Visit (HOSPITAL_COMMUNITY)
Admission: RE | Admit: 2021-07-01 | Discharge: 2021-07-01 | Disposition: A | Discharge status: Home / Self Care | Payer: Medicaid Other | Source: Ambulatory Visit | Attending: Student in an Organized Health Care Education/Training Program | Admitting: Student in an Organized Health Care Education/Training Program

## 2021-07-01 DIAGNOSIS — Z124 Encounter for screening for malignant neoplasm of cervix: Secondary | ICD-10-CM | POA: Insufficient documentation

## 2021-07-01 DIAGNOSIS — R030 Elevated blood-pressure reading, without diagnosis of hypertension: Secondary | ICD-10-CM | POA: Diagnosis not present

## 2021-07-01 NOTE — Patient Instructions (Signed)
It was nice seeing you today! Thank you for choosing [facility] for your Primary Care.    Today we talked about:   Pap smear: I will call you with the results.  Your blood pressure today is much better! We do not need to start any medications.

## 2021-07-01 NOTE — Assessment & Plan Note (Signed)
BP: 132/90   Blood pressure improved today compared to last visit. No history of hypertension. No indication to start anti-hypertensives at this time.

## 2021-07-01 NOTE — Assessment & Plan Note (Signed)
Pap smear obtained today. Abnormalities on examination include a small papule on the left side of the cervix, friability and scant amount of green-tinged discharge.   - Pap smear results pending

## 2021-07-01 NOTE — Progress Notes (Signed)
   CC: Cervical cancer screening  HPI:  Elizabeth Saunders is a 65 y.o. with a PMHx as listed below who presents to the clinic for Cervical cancer screening.   Please see the Encounters tab for problem-based Assessment & Plan regarding status of patient's acute and chronic conditions.  Past Medical History:  Diagnosis Date   Anemia 12/16/2017   Cancer [facility])    colorectal cancer 2019   History of blood transfusion    History of chemotherapy    Review of Systems: Review of Systems  Genitourinary:        - vaginal bleeding   Physical Exam:  BP: 132/90   Physical Exam Vitals and nursing note reviewed. Exam conducted with a chaperone present.  Constitutional:      General: She is not in acute distress.    Appearance: She is normal weight.  Genitourinary:    General: Normal vulva.     Labia:        Right: No rash, tenderness, lesion or injury.        Left: No rash, tenderness, lesion or injury.      Vagina: No signs of injury. No vaginal discharge, erythema, tenderness, bleeding or lesions.     Cervix: Dilated. Discharge (green-tinged but clear discharge, scant amount) and friability present. No erythema or eversion.     Neurological:     Mental Status: She is alert.   Assessment & Plan:   See Encounters Tab for problem based charting.  Patient discussed with Dr. Evette Doffing

## 2021-07-01 NOTE — Telephone Encounter (Signed)
   Telephone encounter was:  Successful.  07/01/2021 Name: Elizabeth Saunders MRN: [id number] DOB: [birth year]  Elizabeth Saunders is a 65 y.o. year old female who is a primary care patient of Zinoviev, Harmon Pier, MD . The community resource team was consulted for assistance with [place] guide performed the following interventions: Spoke with patient's son [employer] does not have a [place] representative.  He stated that patient is currently receiving food stamps. Confirmed his email and sent information for [employer] that offers free assistance with [employer], transportation and other services. Letter in [place].  Follow Up Plan:  Client will send confirmation email he has my name and number and No further follow up planned at this time. The patient has been provided with needed resources.  Leyah Bocchino, AAS Paralegal, [place] [facility]  [address], [place] [address] ??[email or web address]  ?? [phone]   [email or web address]

## 2021-07-01 NOTE — Progress Notes (Signed)
Internal Medicine Clinic Attending  Case discussed with Dr. Basaraba  At the time of the visit.  We reviewed the resident's history and exam and pertinent patient test results.  I agree with the assessment, diagnosis, and plan of care documented in the resident's note.  

## 2021-07-06 LAB — CYTOLOGY - PAP
Comment: NEGATIVE
Diagnosis: NEGATIVE
High risk HPV: NEGATIVE

## 2021-07-15 NOTE — Progress Notes (Signed)
Pap smear cytology and HPV negative. Next pap smear due 06/2026. Results relayed to patient's son who will update Ms. Rehberg.

## 2021-07-18 NOTE — Progress Notes (Signed)
Internal Medicine Clinic Attending ° °I saw and evaluated the patient.  I personally confirmed the key portions of the history and exam documented by Dr. Zinoviev and I reviewed pertinent patient test results.  The assessment, diagnosis, and plan were formulated together and I agree with the documentation in the resident’s note.  °

## 2021-08-03 ENCOUNTER — Other Ambulatory Visit: Payer: Self-pay

## 2021-08-03 ENCOUNTER — Ambulatory Visit (AMBULATORY_SURGERY_CENTER): Payer: Self-pay | Admitting: *Deleted

## 2021-08-03 VITALS — Ht 61.0 in | Wt 129.0 lb

## 2021-08-03 DIAGNOSIS — Z86010 Personal history of colonic polyps: Secondary | ICD-10-CM

## 2021-08-03 DIAGNOSIS — C20 Malignant neoplasm of rectum: Secondary | ICD-10-CM

## 2021-08-03 MED ORDER — NA SULFATE-K SULFATE-MG SULF 17.5-3.13-1.6 GM/177ML PO SOLN: 1.0000 | ORAL | 0 refills | Status: DC

## 2021-08-03 NOTE — Progress Notes (Signed)
Patient and interpreter is here in-person for PV. Interpreter went over consent from with the patient. Patient denies any allergies to eggs or soy. Patient denies any problems with anesthesia/sedation. Patient is not on any oxygen at home. Patient is not taking any diet/weight loss medications or blood thinners. Patient is aware of our care-partner policy and [id number] safety protocol. Pt's son aware of appointment and the need for care partner. English prep instructions given to son.  EMMI education assigned to the patient for the procedure, sent to [place].

## 2021-08-17 ENCOUNTER — Other Ambulatory Visit: Payer: Self-pay

## 2021-08-17 ENCOUNTER — Other Ambulatory Visit: Payer: Medicaid Other

## 2021-08-17 ENCOUNTER — Encounter: Payer: Self-pay | Admitting: Gastroenterology

## 2021-08-17 ENCOUNTER — Ambulatory Visit (AMBULATORY_SURGERY_CENTER): Payer: Medicaid Other | Admitting: Gastroenterology

## 2021-08-17 VITALS — BP 132/66 | HR 71 | Temp 97.5°F | Resp 18 | Ht 61.0 in | Wt 129.0 lb

## 2021-08-17 DIAGNOSIS — D124 Benign neoplasm of descending colon: Secondary | ICD-10-CM | POA: Diagnosis not present

## 2021-08-17 DIAGNOSIS — D123 Benign neoplasm of transverse colon: Secondary | ICD-10-CM

## 2021-08-17 DIAGNOSIS — D122 Benign neoplasm of ascending colon: Secondary | ICD-10-CM | POA: Diagnosis not present

## 2021-08-17 DIAGNOSIS — D649 Anemia, unspecified: Secondary | ICD-10-CM

## 2021-08-17 DIAGNOSIS — R718 Other abnormality of red blood cells: Secondary | ICD-10-CM

## 2021-08-17 DIAGNOSIS — Z86010 Personal history of colonic polyps: Secondary | ICD-10-CM

## 2021-08-17 DIAGNOSIS — Z85038 Personal history of other malignant neoplasm of large intestine: Secondary | ICD-10-CM | POA: Diagnosis not present

## 2021-08-17 DIAGNOSIS — Z85048 Personal history of other malignant neoplasm of rectum, rectosigmoid junction, and anus: Secondary | ICD-10-CM

## 2021-08-17 DIAGNOSIS — D125 Benign neoplasm of sigmoid colon: Secondary | ICD-10-CM | POA: Diagnosis not present

## 2021-08-17 DIAGNOSIS — C20 Malignant neoplasm of rectum: Secondary | ICD-10-CM

## 2021-08-17 DIAGNOSIS — Z1211 Encounter for screening for malignant neoplasm of colon: Secondary | ICD-10-CM | POA: Diagnosis not present

## 2021-08-17 MED ORDER — SODIUM CHLORIDE 0.9 % IV SOLN: 500.0000 mL | Freq: Once | INTRAVENOUS | Status: DC

## 2021-08-17 NOTE — Progress Notes (Signed)
Vital signs checked by:AG  The patient states no changes in medical or surgical history since pre-visit screening on 08/03/2021.
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
## 2021-08-17 NOTE — Progress Notes (Signed)
Report given to PACU, vss 

## 2021-08-17 NOTE — Progress Notes (Signed)
Called to room to assist during endoscopic procedure.  Patient ID and intended procedure confirmed with present staff. Received instructions for my participation in the procedure from the performing physician.  

## 2021-08-17 NOTE — Patient Instructions (Signed)
Impression/Recommendations:  [place] handout given to patient.  Resume previous diet. Continue present medications. Await pathology results.  Recommend ferritin/TIBC panel to rule out iron deficiency.   If iron deficient, then recommend upper endoscopy.  YOU HAD AN ENDOSCOPIC PROCEDURE TODAY AT [facility]:   Refer to the procedure report that was given to you for any specific questions about what was found during the examination.  If the procedure report does not answer your questions, please call your gastroenterologist to clarify.  If you requested that your care partner not be given the details of your procedure findings, then the procedure report has been included in a sealed envelope for you to review at your convenience later.  YOU SHOULD EXPECT: Some feelings of bloating in the abdomen. Passage of more gas than usual.  Walking can help get rid of the air that was put into your GI tract during the procedure and reduce the bloating. If you had a lower endoscopy (such as a colonoscopy or flexible sigmoidoscopy) you may notice spotting of blood in your stool or on the toilet paper. If you underwent a bowel prep for your procedure, you may not have a normal bowel movement for a few days.  Please Note:  You might notice some irritation and congestion in your nose or some drainage.  This is from the oxygen used during your procedure.  There is no need for concern and it should clear up in a day or so.  SYMPTOMS TO REPORT IMMEDIATELY:  Following lower endoscopy (colonoscopy or flexible sigmoidoscopy):  Excessive amounts of blood in the stool  Significant tenderness or worsening of abdominal pains  Swelling of the abdomen that is new, acute  Fever of 100F or higher  For urgent or emergent issues, a gastroenterologist can be reached at any hour by calling [phone]. Do not use MyChart messaging for urgent concerns.    DIET:  We do recommend a small meal at first, but  then you may proceed to your regular diet.  Drink plenty of fluids but you should avoid alcoholic beverages for 24 hours.  ACTIVITY:  You should plan to take it easy for the rest of today and you should NOT DRIVE or use heavy machinery until tomorrow (because of the sedation medicines used during the test).    FOLLOW UP: Our staff will call the number listed on your records 48-72 hours following your procedure to check on you and address any questions or concerns that you may have regarding the information given to you following your procedure. If we do not reach you, we will leave a message.  We will attempt to reach you two times.  During this call, we will ask if you have developed any symptoms of COVID 19. If you develop any symptoms (ie: fever, flu-like symptoms, shortness of breath, cough etc.) before then, please call [phone].  If you test positive for Covid 19 in the 2 weeks post procedure, please call and report this information to [place].    If any biopsies were taken you will be contacted by phone or by letter within the next 1-3 weeks.  Please call us at [phone] if you have not heard about the biopsies in 3 weeks.    SIGNATURES/CONFIDENTIALITY: You and/or your care partner have signed paperwork which will be entered into your electronic medical record.  These signatures attest to the fact that that the information above on your After Visit Summary has been reviewed and is understood.  Full responsibility of the confidentiality of this discharge information lies with you and/or your care-partner.

## 2021-08-17 NOTE — Op Note (Signed)
[facility] Patient Name: Elizabeth Saunders Procedure Date: 08/17/2021 11:39 AM MRN: [id number] Endoscopist: Remo Lipps P. Havery Moros , MD Age: 65 Referring MD:  Date of Birth: [birth year] Gender: Female Account #: [id number] Procedure:                Colonoscopy Indications:              High risk colon cancer surveillance: Personal                            history of colon cancer - rectosigmoid                            adenocarcinoma in 11/2017, last colonoscopy 07/2018                            with 6 adenomas. Patient feels well without                            complaints but does have microcytic mild anemia. Medicines:                Monitored Anesthesia Care Procedure:                Pre-Anesthesia Assessment:                           - Prior to the procedure, a History and Physical                            was performed, and patient medications and                            allergies were reviewed. The patient's tolerance of                            previous anesthesia was also reviewed. The risks                            and benefits of the procedure and the sedation                            options and risks were discussed with the patient.                            All questions were answered, and informed consent                            was obtained. Prior Anticoagulants: The patient has                            taken no previous anticoagulant or antiplatelet                            agents. ASA Grade Assessment: I - A normal, healthy  patient. After reviewing the risks and benefits,                            the patient was deemed in satisfactory condition to                            undergo the procedure.                           After obtaining informed consent, the colonoscope                            was passed under direct vision. Throughout the                            procedure, the patient's blood pressure,  pulse, and                            oxygen saturations were monitored continuously. The                            Olympus Colonoscope [id number] was introduced through                            the anus and advanced to the the cecum, identified                            by appendiceal orifice and ileocecal valve. The                            colonoscopy was performed without difficulty. The                            patient tolerated the procedure well. The quality                            of the bowel preparation was good. The ileocecal                            valve, appendiceal orifice, and rectum were                            photographed. Scope In: 11:49:14 AM Scope Out: 12:04:29 PM Scope Withdrawal Time: 0 hours 13 minutes 35 seconds  Total Procedure Duration: 0 hours 15 minutes 15 seconds  Findings:                 The perianal and digital rectal examinations were                            normal.                           Two sessile polyps were found in the ascending  colon. The polyps were diminutive in size. These                            polyps were removed with a cold snare. Resection                            and retrieval were complete.                           A 3 mm polyp was found in the hepatic flexure. The                            polyp was sessile. The polyp was removed with a                            cold snare. Resection and retrieval were complete.                           A 3 mm polyp was found in the transverse colon. The                            polyp was sessile. The polyp was removed with a                            cold snare. Resection and retrieval were complete.                           Two sessile polyps were found in the descending                            colon. The polyps were diminutive in size. These                            polyps were removed with a cold snare. Resection                             and retrieval were complete.                           A 3 mm polyp was found in the sigmoid colon. The                            polyp was sessile. The polyp was removed with a                            cold snare. Resection and retrieval were complete.                           There was evidence of a prior end-to-end                            colo-colonic anastomosis in the recto-sigmoid  colon, with a blind pouch. This was patent and was                            characterized by healthy appearing mucosa.                           The exam was otherwise without abnormality. Rectal                            vault was small. No retroflexed views obtained. Complications:            No immediate complications. Estimated blood loss:                            Minimal. Estimated Blood Loss:     Estimated blood loss was minimal. Impression:               - Two diminutive polyps in the ascending colon,                            removed with a cold snare. Resected and retrieved.                           - One 3 mm polyp at the hepatic flexure, removed                            with a cold snare. Resected and retrieved.                           - One 3 mm polyp in the transverse colon, removed                            with a cold snare. Resected and retrieved.                           - Two diminutive polyps in the descending colon,                            removed with a cold snare. Resected and retrieved.                           - One 3 mm polyp in the sigmoid colon, removed with                            a cold snare. Resected and retrieved.                           - Patent end-to-end colo-colonic anastomosis,                            characterized by healthy appearing mucosa.                           - The examination was otherwise normal. Recommendation:           -  Patient has a contact number available for                             emergencies. The signs and symptoms of potential                            delayed complications were discussed with the                            patient. Return to normal activities tomorrow.                            Written discharge instructions were provided to the                            patient.                           - Resume previous diet.                           - Continue present medications.                           - Await pathology results.                           - Recommend ferritin / TIBC panel to rule out iron                            deficiency. If the patient is iron deficient would                            then recommend upper endoscopy to clear her upper                            tract Carlota Raspberry. Makenlee Mckeag, MD 08/17/2021 12:10:38 PM This report has been signed electronically.

## 2021-08-17 NOTE — Progress Notes (Signed)
Translator Yo Hin in attendance during time in PACU.

## 2021-08-18 LAB — IRON,TIBC AND FERRITIN PANEL
%SAT: 25 % (calc) (ref 16–45)
Ferritin: 31 ng/mL (ref 16–288)
Iron: 100 ug/dL (ref 45–160)
TIBC: 402 mcg/dL (calc) (ref 250–450)

## 2021-08-19 ENCOUNTER — Telehealth: Payer: Self-pay

## 2021-08-19 NOTE — Telephone Encounter (Signed)
First attempt follow up call to pt, no answer. 

## 2021-08-19 NOTE — Telephone Encounter (Signed)
Second attempt follow up call to pt, no answer.  

## 2021-12-13 ENCOUNTER — Telehealth: Payer: Self-pay

## 2021-12-13 ENCOUNTER — Inpatient Hospital Stay: Payer: Medicaid Other

## 2021-12-13 ENCOUNTER — Inpatient Hospital Stay: Payer: Medicaid Other | Attending: Oncology | Admitting: Oncology

## 2021-12-13 ENCOUNTER — Encounter: Payer: Self-pay | Admitting: Oncology

## 2021-12-13 VITALS — BP 132/79 | HR 83 | Temp 98.2°F | Resp 18 | Ht 61.0 in | Wt 130.4 lb

## 2021-12-13 DIAGNOSIS — Z85048 Personal history of other malignant neoplasm of rectum, rectosigmoid junction, and anus: Secondary | ICD-10-CM | POA: Diagnosis not present

## 2021-12-13 DIAGNOSIS — Z923 Personal history of irradiation: Secondary | ICD-10-CM | POA: Insufficient documentation

## 2021-12-13 DIAGNOSIS — Z933 Colostomy status: Secondary | ICD-10-CM | POA: Insufficient documentation

## 2021-12-13 DIAGNOSIS — C20 Malignant neoplasm of rectum: Secondary | ICD-10-CM | POA: Diagnosis not present

## 2021-12-13 DIAGNOSIS — Z9221 Personal history of antineoplastic chemotherapy: Secondary | ICD-10-CM | POA: Diagnosis not present

## 2021-12-13 DIAGNOSIS — D509 Iron deficiency anemia, unspecified: Secondary | ICD-10-CM | POA: Insufficient documentation

## 2021-12-13 LAB — CEA (ACCESS): CEA (CHCC): 2.2 ng/mL (ref 0.00–5.00)

## 2021-12-13 NOTE — Telephone Encounter (Signed)
Patient's son gave verbal understanding and had no further questions or concerns ?
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
## 2021-12-13 NOTE — Telephone Encounter (Signed)
-----   Message from Ladell Pier, MD sent at 12/13/2021  1:11 PM EDT ----- ?Please call son, CEA is normal, follow-up as scheduled ? ?

## 2022-02-20 ENCOUNTER — Encounter: Payer: Self-pay | Admitting: *Deleted

## 2022-06-14 ENCOUNTER — Other Ambulatory Visit: Payer: Self-pay

## 2022-06-14 ENCOUNTER — Encounter: Payer: Self-pay | Admitting: Nurse Practitioner

## 2022-06-14 ENCOUNTER — Inpatient Hospital Stay: Payer: Medicaid Other

## 2022-06-14 ENCOUNTER — Inpatient Hospital Stay (HOSPITAL_BASED_OUTPATIENT_CLINIC_OR_DEPARTMENT_OTHER): Payer: Medicaid Other | Admitting: Nurse Practitioner

## 2022-06-14 ENCOUNTER — Inpatient Hospital Stay: Payer: Medicaid Other | Attending: Oncology

## 2022-06-14 VITALS — BP 134/71 | HR 78 | Temp 98.2°F | Resp 18 | Ht 61.0 in | Wt 122.6 lb

## 2022-06-14 DIAGNOSIS — C20 Malignant neoplasm of rectum: Secondary | ICD-10-CM

## 2022-06-14 DIAGNOSIS — Z23 Encounter for immunization: Secondary | ICD-10-CM

## 2022-06-14 DIAGNOSIS — C19 Malignant neoplasm of rectosigmoid junction: Secondary | ICD-10-CM | POA: Insufficient documentation

## 2022-06-14 DIAGNOSIS — D509 Iron deficiency anemia, unspecified: Secondary | ICD-10-CM | POA: Insufficient documentation

## 2022-06-14 LAB — CEA (ACCESS): CEA (CHCC): 2.33 ng/mL (ref 0.00–5.00)

## 2022-06-14 MED ORDER — INFLUENZA VAC SPLIT QUAD 0.5 ML IM SUSY: 0.5000 mL | PREFILLED_SYRINGE | Freq: Once | INTRAMUSCULAR | Status: DC

## 2022-06-14 MED ORDER — INFLUENZA VAC SPLIT QUAD 0.5 ML IM SUSY
0.5000 mL | PREFILLED_SYRINGE | Freq: Once | INTRAMUSCULAR | Status: AC
  Administered 2022-06-14: 0.5 mL via INTRAMUSCULAR

## 2022-06-14 MED ORDER — INFLUENZA VAC SPLIT QUAD 0.5 ML IM SUSY
0.5000 mL | PREFILLED_SYRINGE | Freq: Once | INTRAMUSCULAR | Status: DC
  Filled 2022-06-14: qty 0.5

## 2022-06-14 NOTE — Patient Instructions (Signed)
Influenza Virus Vaccine injection ?y l thu?c g? THU?C CHU?NG NG??A VIRUS CU?M (INFLUENZA) giu?p gia?m nguy c? b? b?nh cu?m (influenza), cn ???c g?i l c?m cm (flu). Thu?c chu?ng ng??a na?y chi? giu?p ba?o v? quy? vi? kho?i m?t s? chu?ng cu?m. Thu?c ny c th? ???c dng cho nh?ng m?c ?ch khc; hy h?i ng??i cung c?p d?ch v? y t? ho?c d??c s? c?a mnh, n?u qu v? c th?c m?c. (CC) NHN HI?U PH? BI?N: Afluria, Afluria Quadrivalent, Agriflu, Alfuria, FLUAD, FLUAD Quadrivalent, Fluarix, Fluarix Quadrivalent, Flublok, Flublok Quadrivalent, FLUCELVAX, FLUCELVAX Quadrivalent, Flulaval, Flulaval Quadrivalent, Fluvirin, Fluzone, Fluzone High-Dose, Fluzone Intradermal, Fluzone Quadrivalent Ti c?n ph?i bo cho ng??i cung c?p d?ch v? y t? c?a mnh ?i?u g tr??c khi dng thu?c ny? H? c?n bi?t li?u qu v? c b?t k? tnh tr?ng no sau ?y khng: cc r?i loa?n v? ch?y mu, ch?ng h?n nh? b?nh ?a ch?y mu (hemophilia) s?t, nhi?m trng H?i ch??ng Guillain-Barre ho??c ca?c v?n ?? khc v? th?n kinh cc v?n ?? v? h? mi?n d?ch nhi?m HIV ho?c AIDS l??ng ti?u c?u trong ma?u th?p ch?ng ?a x? c?ng ph?n ?ng b?t th??ng ho?c d? ?ng v?i thu?c ch?ng ng?a virus cm (influenza virus) ho?c nh?a latex pha?n ??ng b?t th???ng ho??c di? ??ng v??i ca?c d??c ph?m kha?c, th?c ph?m, thu?c nhu?m, ho??c ch?t ba?o qua?n. Thu?c ch?ng ng?a c?a cc th??ng hi?u khc nhau c ch?a cc ch?t gy d? ?ng khc nhau. M?t s? thu?c ch?ng ng?a c th? c ch?a nh?a latex ho?c tr?ng. Hy ni v?i bc s? v? cc ch?ng d? ?ng c?a mnh ?? b?o ??m r?ng qu v? s? nh?n ???c lo?i thu?c ch?ng ng?a thch h?p. ?ang c thai ho??c ??nh Graelyn? thai ?ang cho con bu? Ti nn s? d?ng thu?c ny nh? th? no? Thu?c chu?ng ng??a ny ?? tim vo b?p th?t ho??c tim d???i da. Thu?c th??ng ???c s? d?ng b?i chuyn vin y t?. T? [place] v? Thu?c Ch?ng Ng?a s? ???c pht tr??c m?i l?n ch?ng ng?a. Hy ??c k? t? thng tin ny m?i l?n. T? thng tin c th? thay ??i  th??ng xuyn. Hy bn v?i bc s? ho?c chuyn vin y t? c?a mnh ?? xem thu?c ch?ng ng?a no thch h?p v?i qu v?. M?t vi lo?i thu?c ch?ng ng?a khng ???c ?em dng cho t?t c? cc nhm tu?i. Qu li?u: N?u qu v? cho r?ng mnh ? dng qu nhi?u thu?c ny, th hy lin l?c v?i trung tm ki?m sot ch?t ??c ho?c phng c?p c?u ngay l?p t?c. L?U : Thu?c ny ch? dnh ring cho qu v?. Khng chia s? thu?c ny v?i nh?ng ng??i khc. N?u ti l? qun m?t li?u th sao? ?i?u ny khng p d?ng. Nh?ng g c th? t??ng tc v?i thu?c ny? Ho?a tri? li?u ho??c xa? tri? li?u m?t s? thu?c la?m y?u h? th?ng mi?n d?ch c?a qu v?, ch?ng h?n nh? etanercept, anakinra, infliximab, va? adalimumab m?t s? thu?c ?i?u tr? ho?c phng ng?a c?c mu ?ng, ch?ng h?n nh? warfarin phenytoin cc thu?c steroid, ch?ng h?n nh? prednisone ho?c cortisone theophylline cc thu?c ch?ng ng?a Danh sch ny c th? khng m t? ?? h?t cc t??ng tc c th? x?y ra. Hy ??a cho ng??i cung c?p d?ch v? y t? c?a mnh danh sch t?t c? cc thu?c, th?o d??c, cc thu?c khng c?n toa, ho?c cc ch? ph?m b? sung m qu v? dng. C?ng nn bo cho h? bi?t r?ng qu v? c ht thu?c, u?ng  r??u, ho?c c s? d?ng ma ty tri php hay khng. Vi th? c th? t??ng tc v?i thu?c c?a qu v?. Ti c?n ph?i theo di ?i?u g trong khi dng thu?c ny? Ha?y ba?o cho ba?c si? ho??c chuyn vin y t? bi?t b?t ky? ta?c du?ng phu? na?o khng bi?n m?t trong vo?ng 3 nga?y. Ha?y lin la?c v??i ba?c si? ho??c chuyn vin y t? c?a mnh, n?u b?t ky? tri?u ch??ng b?t th???ng na?o xu?t hi?n trong vo?ng 6 tu?n k? t?? khi tim thu?c chu?ng ng??a na?y. Quy? vi? v?n Jaslyn? th? b? cu?m, nh?ng b?nh se? khng n??ng nh? th???ng l?. Quy? vi? khng th? b? cu?m do thu?c chu?ng ng??a. Thu?c chu?ng ng??a se? khng ba?o v? kho?i cc ch?ng ca?m la?nh ho??c ca?c b?nh Krisi? th? gy s?t khc. C?n tim thu?c chu?ng ng??a na?y ha?ng n?m. Ti c th? nh?n th?y nh?ng tc d?ng ph? no khi dng  thu?c ny? Nh?ng tc d?ng ph? qu v? c?n ph?i bo cho ??i ng? ch?m [place] cng s?m cng t?t: cc ph?n ?ng d? ?ng, ch?ng h?n nh? da b? m?n ??, ng?a, n?i my ?ay, s?ng ? m?t, mi, ho?c l??i Cc tc d?ng ph? khng c?n ph?i ch?m [place] y t? (hy bo cho ??i ng? ch?m  n?u cc tc d?ng ph? ny ti?p di?n ho?c gy phi?n toi): s?t ?au ??u ?au ho?c nh?c c? b?p b? ?au,  ?m, ??, ho?c s?ng ? ch? tim c?m th?y m?t m?i Danh sch ny c th? khng m t? ?? h?t cc tc d?ng ph? c th? x?y ra. Xin g?i t?i bc s? c?a mnh ?? ???c c? v?n chuyn mn v? cc tc d?ng ph?Sander Nephew v? c th? t??ng trnh cc tc d?ng ph? cho FDA theo s? [phone]. Ti nn c?t gi? thu?c c?a mnh ? ?u? Thu?c chu?ng ng??a na?y se? ????c tim b??i chuyn vin y t? ta?i pho?ng ma?ch, hi?u thu?c, v?n pho?ng ba?c si? ho??c c? s?? y t? kha?c. Qu v? s? khng ???c c?p thu?c chu?ng ng??a ny ?? c?t gi? t?i nh. L?U : ?y l b?n tm t?t. N c th? khng bao hm t?t c? thng tin c th? c. N?u qu v? th?c m?c v? thu?c ny, xin trao ??i v?i bc s?, d??c s?, ho?c ng??i cung c?p d?ch v? y t? c?a mnh.  2023 Elsevier/Gold Standard (2021-04-21 00:00:00)

## 2022-06-14 NOTE — Progress Notes (Signed)
Visit today assisted by [facility] w/CAP.
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
## 2022-06-14 NOTE — Addendum Note (Signed)
Addended by: Roselind Messier A on: 06/14/2022 09:02 AM   Modules accepted: Orders

## 2022-12-15 ENCOUNTER — Inpatient Hospital Stay (HOSPITAL_BASED_OUTPATIENT_CLINIC_OR_DEPARTMENT_OTHER): Payer: Medicaid Other | Admitting: Oncology

## 2022-12-15 ENCOUNTER — Inpatient Hospital Stay: Payer: Medicaid Other | Attending: Oncology

## 2022-12-15 VITALS — BP 139/65 | HR 96 | Temp 98.2°F | Resp 20 | Ht 61.0 in | Wt 121.0 lb

## 2022-12-15 DIAGNOSIS — C20 Malignant neoplasm of rectum: Secondary | ICD-10-CM

## 2022-12-15 DIAGNOSIS — Z85048 Personal history of other malignant neoplasm of rectum, rectosigmoid junction, and anus: Secondary | ICD-10-CM | POA: Diagnosis present

## 2022-12-15 DIAGNOSIS — Z08 Encounter for follow-up examination after completed treatment for malignant neoplasm: Secondary | ICD-10-CM | POA: Insufficient documentation

## 2022-12-15 DIAGNOSIS — D509 Iron deficiency anemia, unspecified: Secondary | ICD-10-CM | POA: Diagnosis not present

## 2022-12-15 LAB — CEA (ACCESS): CEA (CHCC): 2.08 ng/mL (ref 0.00–5.00)
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
## 2022-12-28 ENCOUNTER — Ambulatory Visit (INDEPENDENT_AMBULATORY_CARE_PROVIDER_SITE_OTHER): Payer: Medicaid Other | Admitting: Student

## 2022-12-28 ENCOUNTER — Encounter: Payer: Self-pay | Admitting: Student

## 2022-12-28 ENCOUNTER — Other Ambulatory Visit: Payer: Self-pay

## 2022-12-28 VITALS — BP 132/63 | HR 63 | Temp 98.1°F | Ht 65.0 in | Wt 125.3 lb

## 2022-12-28 DIAGNOSIS — D509 Iron deficiency anemia, unspecified: Secondary | ICD-10-CM

## 2022-12-28 DIAGNOSIS — C20 Malignant neoplasm of rectum: Secondary | ICD-10-CM | POA: Diagnosis not present

## 2022-12-28 DIAGNOSIS — Z1159 Encounter for screening for other viral diseases: Secondary | ICD-10-CM

## 2022-12-28 DIAGNOSIS — D508 Other iron deficiency anemias: Secondary | ICD-10-CM

## 2022-12-28 DIAGNOSIS — Z1231 Encounter for screening mammogram for malignant neoplasm of breast: Secondary | ICD-10-CM

## 2022-12-28 DIAGNOSIS — Z0000 Encounter for general adult medical examination without abnormal findings: Secondary | ICD-10-CM

## 2022-12-28 NOTE — Assessment & Plan Note (Signed)
Diagnosis 5 years ago status post resection, chemotherapy and radiation.  She has been in remission since then. She tells me she has been doing well overall and followed up with oncology 2 weeks ago.  She takes some Tylenol occasionally for headache but is not on any other medications. She reports some fatigue but otherwise denies any shortness of breath, dizziness, chest pain, abdominal pain, bloody stools or rectal pain. She is on every 3 years colonoscopy surveillance. Last colonoscopy in 2022 so she will be due for her next one next year. -Follow-up with oncology as scheduled on 06/16/2023 -Follow-up with GI for colonoscopy next year

## 2022-12-28 NOTE — Progress Notes (Addendum)
   CC: Follow-up  HPI:  Elizabeth Saunders is a 67 y.o. female with PMH as below who presents to clinic accompanied by her son for follow-up on her chronic medical problems. Please see problem based charting for evaluation, assessment and plan.  Past Medical History:  Diagnosis Date   Anemia 12/16/2017   Cancer (HCC)    colorectal cancer 2019   History of blood transfusion    History of chemotherapy    Review of Systems:  Constitutional: Positive for occasional fatigue Eyes: Negative for visual changes Respiratory: Negative for shortness of breath Cardiac: Negative for chest pain or palpitations Abdomen: Negative for abdominal pain, N/V, constipation, bloody stools or diarrhea Neuro: Negative for headache, dizziness or weakness  Physical Exam: General: Pleasant, well-appearing [place] woman. No acute distress. HEENT: Anicteric sclerae.  EOMI.  PERRLA. Cardiac: RRR. No murmurs, rubs or gallops. No LE edema Respiratory: Lungs CTAB. No wheezing or crackles. Abdominal: Soft.  NT/ND. Healed abdominal midline incision. Normal BS. Skin: Warm, dry and intact without rashes or lesions Extremities: Atraumatic. Full ROM. Palpable radial and DP pulses. Neuro: A&O x 3. Moves all extremities Psych: Appropriate mood and affect.  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  She has been in remission since then. She tells me she has been doing well overall and followed up with oncology 2 weeks ago.  She takes some Tylenol occasionally for headache but is not on any other medications. She reports some fatigue but otherwise denies any shortness of breath, dizziness, chest pain, abdominal pain, bloody stools or rectal pain. She is on every 3 years  colonoscopy surveillance. Last colonoscopy in 2022 so she will be due for her next one next year. -Follow-up with oncology as scheduled on 06/16/2023 -Follow-up with GI for colonoscopy next year  Microcytic anemia Thought to be a combination of iron deficiency anemia and thalassemia variant. Last labs in 2022 showed Hgb 11.7, MCV 71, RDW 16, iron 100, iron sat 25% and ferritin 31. She reports occasional fatigue but denies any dizziness, shortness of breath, bloody stools or palpitations. -Check CBC, iron studies, ferritin  Addendum: Iron studies show worsening iron deficiency with iron of 76, iron sat 19%, TIBC 403, ferritin of 26. Hgb is stable at 11.4. Due to her occasional fatigue and these findings, I will start patient on iron supplementation. -Start ferrous sulfate 325 mg every other day  Healthcare maintenance Negative hepatitis C screening   Breast cancer screening  Last documented mammogram was in 2006. Patient agreeable to repeat screening. Order for mammogram placed.  See Encounters Tab for problem based charting.  Patient discussed with Dr.  Oretha Milch, MD, MPH

## 2022-12-28 NOTE — Assessment & Plan Note (Addendum)
Negative hepatitis C screening

## 2022-12-28 NOTE — Assessment & Plan Note (Addendum)
Thought to be a combination of iron deficiency anemia and thalassemia variant. Last labs in 2022 showed Hgb 11.7, MCV 71, RDW 16, iron 100, iron sat 25% and ferritin 31. She reports occasional fatigue but denies any dizziness, shortness of breath, bloody stools or palpitations. -Check CBC, iron studies, ferritin  Addendum: Iron studies show worsening iron deficiency with iron of 76, iron sat 19%, TIBC 403, ferritin of 26. Hgb is stable at 11.4. Due to her occasional fatigue and these findings, I will start patient on iron supplementation. -Start ferrous sulfate 325 mg every other day

## 2022-12-28 NOTE — Patient Instructions (Signed)
Thank you, [name] for allowing [place] to provide your care today. Today we discussed your cancer and your overall wellbeing today.  I am glad that you are still in a remission.  Please make sure to follow-up with your oncologist as scheduled.  I have ordered the following labs for you:   Lab Orders         CBC no Diff         Ferritin         Hepatitis C Ab reflex to Quant PCR         Iron and IBC ([id number])      I will call if any are abnormal. All of your labs can be accessed through "My Chart".  I have ordered the following tests: Mammogram My Chart Access: [email or web address]  Please follow-up in 6 months  Please make sure to arrive 15 minutes prior to your next appointment. If you arrive late, you may be asked to reschedule.    We look forward to seeing you next time. Please call our clinic at [phone] if you have any questions or concerns. The best time to call is Monday-Friday from 9am-4pm, but there is someone available 24/7. If after hours or the weekend, call the main hospital number and ask for the Internal Medicine Resident On-Call. If you need medication refills, please notify your pharmacy one week in advance and they will send [place] a request.   Thank you for letting us take part in your care. Wishing you the best!  Steffanie Rainwater, MD 12/28/2022, 9:28 AM IM Resident, PGY-3 [place]wayne Heck 41:10

## 2022-12-29 LAB — CBC
Hematocrit: 38.5 % (ref 34.0–46.6)
Hemoglobin: 11.4 g/dL (ref 11.1–15.9)
MCH: 20.8 pg — ABNORMAL LOW (ref 26.6–33.0)
MCHC: 29.6 g/dL — ABNORMAL LOW (ref 31.5–35.7)
MCV: 70 fL — ABNORMAL LOW (ref 79–97)
Platelets: 236 10*3/uL (ref 150–450)
RBC: 5.47 x10E6/uL — ABNORMAL HIGH (ref 3.77–5.28)
RDW: 16.8 % — ABNORMAL HIGH (ref 11.7–15.4)
WBC: 5.8 10*3/uL (ref 3.4–10.8)

## 2022-12-29 LAB — IRON AND TIBC
Iron Saturation: 19 % (ref 15–55)
Iron: 76 ug/dL (ref 27–139)
Total Iron Binding Capacity: 403 ug/dL (ref 250–450)
UIBC: 327 ug/dL (ref 118–369)

## 2022-12-29 LAB — HCV AB W REFLEX TO QUANT PCR: HCV Ab: NONREACTIVE

## 2022-12-29 LAB — FERRITIN: Ferritin: 26 ng/mL (ref 15–150)

## 2022-12-29 LAB — HCV INTERPRETATION

## 2022-12-30 MED ORDER — FERROUS SULFATE 325 (65 FE) MG PO TBEC: DELAYED_RELEASE_TABLET | ORAL | 1 refills | Status: DC

## 2022-12-30 NOTE — Assessment & Plan Note (Signed)
Last documented mammogram was in 2006. Patient agreeable to repeat screening. Order for mammogram placed.

## 2022-12-30 NOTE — Addendum Note (Signed)
Addended bySharrell Ku on: 12/30/2022 10:49 AM   Modules accepted: Orders

## 2022-12-30 NOTE — Progress Notes (Signed)
Her iron studies show worsening iron deficiency anemia. I have started patient on every other day ferrous sulfate. I was unable to reach her son after 2 attempts. I will attempt to reach him at another time to discuss the plan.

## 2023-01-03 NOTE — Progress Notes (Signed)
Internal Medicine Clinic Attending  Case discussed with Dr. Kirke Corin  At the time of the visit.  We reviewed the resident's history and exam and pertinent patient test results.  I agree with the assessment, diagnosis, and plan of care documented in the resident's note with the following clarification--no evidence of anemia although appears to have persistent iron deficiency. Previous GI note mentioned upper endoscopy if persisting, I have asked Dr. Kirke Corin to f/u with GI for discussion.

## 2023-01-03 NOTE — Progress Notes (Signed)
I was able to get in touch with her son today. I have informed him to pick up the iron supplementation and provided him the number for [facility] for him to make a follow up appointment with them.

## 2023-01-11 ENCOUNTER — Telehealth: Payer: Self-pay

## 2023-01-11 NOTE — Telephone Encounter (Signed)
Scheduled follow up with patient's son - 04/19/23 at 8:30 am. Pt's son is aware that I will mail appt information, he confirmed address on file.

## 2023-01-13 ENCOUNTER — Ambulatory Visit
Admission: RE | Admit: 2023-01-13 | Discharge: 2023-01-13 | Disposition: A | Discharge status: Home / Self Care | Payer: Medicaid Other | Source: Ambulatory Visit | Attending: Internal Medicine | Admitting: Internal Medicine

## 2023-01-13 DIAGNOSIS — Z1231 Encounter for screening mammogram for malignant neoplasm of breast: Secondary | ICD-10-CM | POA: Diagnosis not present

## 2023-01-16 ENCOUNTER — Other Ambulatory Visit: Payer: Self-pay | Admitting: Student

## 2023-01-16 DIAGNOSIS — R928 Other abnormal and inconclusive findings on diagnostic imaging of breast: Secondary | ICD-10-CM

## 2023-01-21 NOTE — Progress Notes (Signed)
Patient was found to have asymmetry in her left breast. A diagnostic mammogram and ultrasound was recommended so I ordered these and she has been scheduled for 5/31 to complete these.  I also reached out to GI concerning her iron deficiency she has been scheduled for a follow-up in August.

## 2023-01-27 ENCOUNTER — Ambulatory Visit
Admission: RE | Admit: 2023-01-27 | Discharge: 2023-01-27 | Disposition: A | Discharge status: Home / Self Care | Payer: Medicaid Other | Source: Ambulatory Visit | Attending: Internal Medicine | Admitting: Internal Medicine

## 2023-01-27 ENCOUNTER — Other Ambulatory Visit: Payer: Self-pay | Admitting: Student

## 2023-01-27 DIAGNOSIS — R928 Other abnormal and inconclusive findings on diagnostic imaging of breast: Secondary | ICD-10-CM

## 2023-01-27 DIAGNOSIS — N6489 Other specified disorders of breast: Secondary | ICD-10-CM | POA: Diagnosis not present

## 2023-01-30 NOTE — Progress Notes (Signed)
Patient's diagnostic left mammogram and left breast ultrasound showed findings that are likely benign. I called her son to discuss the findings and he informed they were already aware of the findings. She will need a repeat left diagnostic mammogram in 6 months to ensure stability.

## 2023-04-12 ENCOUNTER — Encounter: Payer: Self-pay | Admitting: Oncology

## 2023-04-19 ENCOUNTER — Ambulatory Visit: Payer: Medicare Other | Admitting: Gastroenterology

## 2023-04-19 NOTE — Progress Notes (Deleted)
HPI :   Referred for IDA   67 y/o female here for a follow up visit. I met her in April when she was admitted to the hospital with profound iron deficiency and partial colonic obstruction. Flex sig showed a nearly completely obstructing mass lesion. 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 Followed by Oncology and completed chemotherapy with Folfox and Xeloda about a month ago, and also received XRT. She is hoping to have takedown of colostomy and is in need for pre-operative colonoscopy.   She has no complaints today. She has recovered quite well. Reports normal functioning of her ostomy and bowels. No abdominal pains. No blood in the stools. Weight is stable.    Flex sig 12/17/2017 - A fungating nearly completely obstructing large mass was found in the recto-sigmoid colon at an angulated turn, roughly 13-15cm from anal verge. The mass was circumferential.   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 2/17 lymph node positive. CT abdomen/pelvis 12/16/2017-negative for metastatic disease CT chest 12/18/2017-indeterminate right lung nodule     Colonoscopy 08/10/2018: - The perianal and digital rectal examinations were normal. - A 4 mm polyp was found in the ascending colon. The polyp was sessile. The polyp was removed with a cold snare. Resection and retrieval were complete. - Four sessile polyps were found in the transverse colon. The polyps were 3 mm in size. These polyps were removed with a cold snare. Resection and retrieval were complete. - A 3 mm polyp was found in the sigmoid colon. The polyp was sessile. The polyp was removed with a cold snare. Resection and retrieval were complete. - The exam was otherwise normal throughout the examined colon. The ostomy was normal. Rectal remant appeared  normal with some diversion changes and mucous - prep inadequate for screening purposes in the rectal remnant.   Surgical [P], sigmoid colon, ascending, transverse, polyp (6) - TUBULAR ADENOMA (SIX). - NO HIGH GRADE DYSPLASIA OR MALIGNANCY.   Colonoscopy 08/17/2021: - Two diminutive polyps in the ascending colon, removed with a cold snare. Resected and retrieved. - One 3 mm polyp at the hepatic flexure, removed with a cold snare. Resected and retrieved. - One 3 mm polyp in the transverse colon, removed with a cold snare. Resected and retrieved. - Two diminutive polyps in the descending colon, removed with a cold snare. Resected and retrieved. - One 3 mm polyp in the sigmoid colon, removed with a cold snare. Resected and retrieved. - Patent end-to-end colo-colonic anastomosis, characterized by healthy appearing mucosa. - The examination was otherwise normal. Diagnosis Surgical [P], colon, hepatic flexure, ascending, transverse, descending and sigmoid, polyp (7) - TUBULAR ADENOMAS, NEGATIVE FOR HIGH-GRADE DYSPLASIA. - NEGATIVE FOR MALIGNANCY.  Repeat in 3 years    CT CAP 11/2020: IMPRESSION: 1. Surgical changes from rectosigmoid resection. No findings suspicious for recurrent tumor, locoregional adenopathy or distant metastatic disease. 2. Stable bilateral pleural calcifications, right greater than left. 3. Diffuse fatty infiltration of the liver. 4. Cholelithiasis.      Past Medical History:  Diagnosis Date   Anemia 12/16/2017   Cancer [facility])    colorectal cancer 2019   History of blood transfusion    History of chemotherapy      Past Surgical History:  Procedure Laterality Date   COLON RESECTION N/A 12/18/2017   Procedure: COLON RESECTION;  Surgeon: Phylliss Blakes  A, MD;  Location: [facility] OR;  Service: General;  Laterality: N/A;   COLOSTOMY N/A 12/18/2017   Procedure: COLOSTOMY;  Surgeon: Berna Bue, MD;  Location: [facility] OR;  Service: General;  Laterality: N/A;   COLOSTOMY  TAKEDOWN N/A 10/05/2018   Procedure: LAPAROSCOPIC CONVERTED TO OPEN REVERSAL OF END COLOSTOMY;  Surgeon: Berna Bue, MD;  Location: [facility] ORS;  Service: General;  Laterality: N/A;   CYSTOSCOPY WITH STENT PLACEMENT Left 10/05/2018   Procedure: CYSTOSCOPY WITH STENT PLACEMENT;  Surgeon: Ihor Gully, MD;  Location: [facility] ORS;  Service: Urology;  Laterality: Left;   FLEXIBLE SIGMOIDOSCOPY N/A 12/17/2017   Procedure: FLEXIBLE SIGMOIDOSCOPY;  Surgeon: Benancio Deeds, MD;  Location: [facility] ENDOSCOPY;  Service: Gastroenterology;  Laterality: N/A;   ILEOSTOMY  10/05/2018   Procedure: ILEOSTOMY;  Surgeon: Berna Bue, MD;  Location: [facility] ORS;  Service: General;;   ILEOSTOMY CLOSURE N/A 03/27/2019   Procedure: REVERSAL OF LOOP ILEOSTOMY;  Surgeon: Berna Bue, MD;  Location: [facility] ORS;  Service: General;  Laterality: N/A;   IR FLUORO GUIDE PORT INSERTION RIGHT  01/02/2018   IR US GUIDE VASC ACCESS RIGHT  01/02/2018   LAPAROSCOPIC LYSIS OF ADHESIONS  10/05/2018   Procedure: LAPAROSCOPIC LYSIS OF ADHESIONS EXTENSIVE;  Surgeon: Berna Bue, MD;  Location: [facility] ORS;  Service: General;;   PORT-A-CATH REMOVAL N/A 10/05/2018   Procedure: REMOVAL PORT-A-CATH;  Surgeon: Berna Bue, MD;  Location: [facility] ORS;  Service: General;  Laterality: N/A;   Family History  Problem Relation Age of Onset   Colon cancer Neg Hx    Esophageal cancer Neg Hx    Rectal cancer Neg Hx    Stomach cancer Neg Hx    Social History   Tobacco Use   Smoking status: Never   Smokeless tobacco: Never  Vaping Use   Vaping status: Never Used  Substance Use Topics   Alcohol use: No    Alcohol/week: 0.0 standard drinks of alcohol   Drug use: No   Current Outpatient Medications  Medication Sig Dispense Refill   acetaminophen (TYLENOL) 325 MG tablet Take 650 mg by mouth every 6 (six) hours as needed.     ferrous sulfate 325 (65 FE) MG EC tablet Take 1 tablet every other day 60 tablet 1   No current facility-administered  medications for this visit.   No Active Allergies   Review of Systems: All systems reviewed and negative except where noted in HPI.    No results found.  Physical Exam: There were no vitals taken for this visit. Constitutional: Pleasant,well-developed, ***female in no acute distress. HEENT: Normocephalic and atraumatic. Conjunctivae are normal. No scleral icterus. Neck supple.  Cardiovascular: Normal rate, regular rhythm.  Pulmonary/chest: Effort normal and breath sounds normal. No wheezing, rales or rhonchi. Abdominal: Soft, nondistended, nontender. Bowel sounds active throughout. There are no masses palpable. No hepatomegaly. Extremities: no edema Lymphadenopathy: No cervical adenopathy noted. Neurological: Alert and oriented to person place and time. Skin: Skin is warm and dry. No rashes noted. Psychiatric: Normal mood and affect. Behavior is normal.   ASSESSMENT: 67 y.o. female here for assessment of the following  No diagnosis found.  PLAN:   Olegario Messier, MD

## 2023-06-09 ENCOUNTER — Encounter: Payer: Self-pay | Admitting: Oncology

## 2023-06-10 ENCOUNTER — Ambulatory Visit: Payer: Medicare Other

## 2023-06-10 DIAGNOSIS — Z23 Encounter for immunization: Secondary | ICD-10-CM

## 2023-06-16 ENCOUNTER — Inpatient Hospital Stay: Payer: Medicare Other | Attending: Student

## 2023-06-16 ENCOUNTER — Inpatient Hospital Stay: Payer: Medicare Other | Admitting: Nurse Practitioner

## 2023-06-30 ENCOUNTER — Encounter: Payer: Medicare Other | Admitting: Student

## 2023-07-06 ENCOUNTER — Encounter: Payer: Self-pay | Admitting: Oncology

## 2023-07-19 ENCOUNTER — Encounter: Payer: Self-pay | Admitting: Oncology

## 2023-07-26 ENCOUNTER — Ambulatory Visit: Payer: Medicare Other | Admitting: Student

## 2023-07-26 ENCOUNTER — Encounter: Payer: Self-pay | Admitting: Student

## 2023-07-26 VITALS — BP 120/72 | HR 65 | Temp 98.4°F | Ht 65.0 in | Wt 118.9 lb

## 2023-07-26 DIAGNOSIS — D509 Iron deficiency anemia, unspecified: Secondary | ICD-10-CM

## 2023-07-26 DIAGNOSIS — C20 Malignant neoplasm of rectum: Secondary | ICD-10-CM

## 2023-07-26 DIAGNOSIS — H538 Other visual disturbances: Secondary | ICD-10-CM | POA: Diagnosis present

## 2023-07-26 DIAGNOSIS — Z1231 Encounter for screening mammogram for malignant neoplasm of breast: Secondary | ICD-10-CM

## 2023-07-26 NOTE — Assessment & Plan Note (Signed)
Slow progression.  Not sudden.  Has some trouble reading.  Will refer her to optometry.

## 2023-07-26 NOTE — Addendum Note (Signed)
Addended by: Carlynn Purl C on: 07/26/2023 03:10 PM   Modules accepted: Level of Service

## 2023-07-26 NOTE — Progress Notes (Signed)
CC: Follow up  HPI:  Elizabeth Saunders is a 67 y.o. female living with a history stated below and presents today for Follow up. Please see problem based assessment and plan for additional details.  Past Medical History:  Diagnosis Date   Anemia 12/16/2017   Cancer [facility])    colorectal cancer 2019   History of blood transfusion    History of chemotherapy     Current Outpatient Medications on File Prior to Visit  Medication Sig Dispense Refill   acetaminophen (TYLENOL) 325 MG tablet Take 650 mg by mouth every 6 (six) hours as needed.     ferrous sulfate 325 (65 FE) MG EC tablet Take 1 tablet every other day 60 tablet 1   No current facility-administered medications on file prior to visit.    Family History  Problem Relation Age of Onset   Colon cancer Neg Hx    Esophageal cancer Neg Hx    Rectal cancer Neg Hx    Stomach cancer Neg Hx     Social History   Socioeconomic History   Marital status: Single    Spouse name: Not on file   Number of children: 1   Years of education: Not on file   Highest education level: Not on file  Occupational History   Not on file  Tobacco Use   Smoking status: Never   Smokeless tobacco: Never  Vaping Use   Vaping status: Never Used  Substance and Sexual Activity   Alcohol use: No    Alcohol/week: 0.0 standard drinks of alcohol   Drug use: No   Sexual activity: Not on file  Other Topics Concern   Not on file  Social History Narrative   Unable to ask intimate partner violence questions, daughter present   Social Determinants of Health   Financial Resource Strain: Not on file  Food Insecurity: Not on file  Transportation Needs: Not on file  Physical Activity: Not on file  Stress: Not on file  Social Connections: Not on file  Intimate Partner Violence: Not on file    Review of Systems: ROS negative except for what is noted on the assessment and plan.  Vitals:   07/26/23 0841  BP: 120/72  Pulse: 65  Temp: 98.4 F (36.9 C)   TempSrc: Oral  SpO2: 100%  Weight: 118 lb 14.4 oz (53.9 kg)  Height: 5\' 5"  (1.651 m)    Physical Exam: Constitutional: well-appearing female sitting in chair, in no acute distress HENT: normocephalic atraumatic, mucous membranes moist Eyes: conjunctiva non-erythematous Cardiovascular: regular rate and rhythm, no m/r/g Pulmonary/Chest: normal work of breathing on room air, lungs clear to auscultation bilaterally MSK: normal bulk and tone Neurological: alert & oriented x 3, no focal deficit Skin: warm and dry Psych: normal mood and behavior  Assessment & Plan:   Patient seen with Dr. Cleda Daub  Microcytic anemia Was prescribed iron at last visit earlier this year, but never took it.  Was symptomatic then but is no longer.  Lab work then showed low normal levels of iron and CBC.  She is microcytic but not technically anemic.  Will hold off on supplementation for now.  No signs of bleeding.  Blurry vision, bilateral Slow progression.  Not sudden.  Has some trouble reading.  Will refer her to optometry.  Breast cancer screening Screening mammogram earlier showed a nodule on the left breast requiring follow-up.  A diagnostic mammogram on the left is scheduled for December of this month, she is aware  and planning to go.  Adenocarcinoma of rectum (HCC) 5 years status post resection, chemotherapy, radiation.  Remains in touch with oncology, goes every 6 months.  She gets colonoscopies every 3 years.  Next is scheduled for the end of 2025.  No bleeding or changes in bowel habits.  RTC 6 months for checkup.  Katheran James, D.O. [facility] Internal Medicine, PGY-1 Phone: [phone] Date 07/26/2023 Time 10:21 AM

## 2023-07-26 NOTE — Assessment & Plan Note (Signed)
5 years status post resection, chemotherapy, radiation.  Remains in touch with oncology, goes every 6 months.  She gets colonoscopies every 3 years.  Next is scheduled for the end of 2025.  No bleeding or changes in bowel habits.

## 2023-07-26 NOTE — Assessment & Plan Note (Signed)
Was prescribed iron at last visit earlier this year, but never took it.  Was symptomatic then but is no longer.  Lab work then showed low normal levels of iron and CBC.  She is microcytic but not technically anemic.  Will hold off on supplementation for now.  No signs of bleeding.

## 2023-07-26 NOTE — Assessment & Plan Note (Signed)
Screening mammogram earlier showed a nodule on the left breast requiring follow-up.  A diagnostic mammogram on the left is scheduled for December of this month, she is aware and planning to go.

## 2023-07-26 NOTE — Progress Notes (Signed)
Internal Medicine Clinic Attending  I was physically present during the key portions of the resident provided service and participated in the medical decision making of patient's management care. I reviewed pertinent patient test results.  The assessment, diagnosis, and plan were formulated together and I agree with the documentation in the resident's note.  Gust Rung, DO

## 2023-08-01 ENCOUNTER — Ambulatory Visit
Admission: RE | Admit: 2023-08-01 | Discharge: 2023-08-01 | Disposition: A | Discharge status: Home / Self Care | Payer: Medicare Other | Source: Ambulatory Visit | Attending: Internal Medicine

## 2023-08-01 ENCOUNTER — Other Ambulatory Visit: Payer: Self-pay | Admitting: Student

## 2023-08-01 DIAGNOSIS — N6489 Other specified disorders of breast: Secondary | ICD-10-CM

## 2023-08-01 DIAGNOSIS — R928 Other abnormal and inconclusive findings on diagnostic imaging of breast: Secondary | ICD-10-CM

## 2024-01-13 ENCOUNTER — Encounter: Payer: Self-pay | Admitting: Oncology

## 2024-02-05 ENCOUNTER — Encounter: Payer: Self-pay | Admitting: Oncology

## 2024-02-12 ENCOUNTER — Ambulatory Visit
Admission: RE | Admit: 2024-02-12 | Discharge: 2024-02-12 | Disposition: A | Discharge status: Home / Self Care | Payer: Medicare Other | Source: Ambulatory Visit | Attending: Internal Medicine | Admitting: Internal Medicine

## 2024-02-12 DIAGNOSIS — N6489 Other specified disorders of breast: Secondary | ICD-10-CM

## 2024-05-11 ENCOUNTER — Encounter (HOSPITAL_COMMUNITY): Payer: Self-pay

## 2024-05-11 ENCOUNTER — Other Ambulatory Visit: Payer: Self-pay

## 2024-05-11 ENCOUNTER — Emergency Department (HOSPITAL_COMMUNITY)

## 2024-05-11 ENCOUNTER — Inpatient Hospital Stay (HOSPITAL_COMMUNITY)
Admission: EM | Admit: 2024-05-11 | Discharge: 2024-05-13 | DRG: 418 | Disposition: A | Discharge status: Home / Self Care | Attending: Infectious Diseases | Admitting: Infectious Diseases

## 2024-05-11 DIAGNOSIS — E1169 Type 2 diabetes mellitus with other specified complication: Secondary | ICD-10-CM | POA: Diagnosis present

## 2024-05-11 DIAGNOSIS — Z9221 Personal history of antineoplastic chemotherapy: Secondary | ICD-10-CM | POA: Diagnosis not present

## 2024-05-11 DIAGNOSIS — K8042 Calculus of bile duct with acute cholecystitis without obstruction: Secondary | ICD-10-CM | POA: Diagnosis not present

## 2024-05-11 DIAGNOSIS — Z85048 Personal history of other malignant neoplasm of rectum, rectosigmoid junction, and anus: Secondary | ICD-10-CM | POA: Diagnosis not present

## 2024-05-11 DIAGNOSIS — Z923 Personal history of irradiation: Secondary | ICD-10-CM

## 2024-05-11 DIAGNOSIS — K8063 Calculus of gallbladder and bile duct with acute cholecystitis with obstruction: Secondary | ICD-10-CM | POA: Diagnosis present

## 2024-05-11 DIAGNOSIS — K8064 Calculus of gallbladder and bile duct with chronic cholecystitis without obstruction: Secondary | ICD-10-CM | POA: Diagnosis not present

## 2024-05-11 DIAGNOSIS — Z79899 Other long term (current) drug therapy: Secondary | ICD-10-CM | POA: Diagnosis not present

## 2024-05-11 DIAGNOSIS — E119 Type 2 diabetes mellitus without complications: Secondary | ICD-10-CM | POA: Diagnosis not present

## 2024-05-11 DIAGNOSIS — Z860101 Personal history of adenomatous and serrated colon polyps: Secondary | ICD-10-CM

## 2024-05-11 DIAGNOSIS — R7989 Other specified abnormal findings of blood chemistry: Secondary | ICD-10-CM | POA: Diagnosis not present

## 2024-05-11 DIAGNOSIS — R1011 Right upper quadrant pain: Principal | ICD-10-CM

## 2024-05-11 DIAGNOSIS — Z85038 Personal history of other malignant neoplasm of large intestine: Secondary | ICD-10-CM

## 2024-05-11 DIAGNOSIS — D509 Iron deficiency anemia, unspecified: Secondary | ICD-10-CM | POA: Diagnosis present

## 2024-05-11 DIAGNOSIS — Z794 Long term (current) use of insulin: Secondary | ICD-10-CM | POA: Diagnosis not present

## 2024-05-11 DIAGNOSIS — K760 Fatty (change of) liver, not elsewhere classified: Secondary | ICD-10-CM | POA: Diagnosis present

## 2024-05-11 DIAGNOSIS — Z9049 Acquired absence of other specified parts of digestive tract: Secondary | ICD-10-CM | POA: Diagnosis not present

## 2024-05-11 DIAGNOSIS — E1165 Type 2 diabetes mellitus with hyperglycemia: Secondary | ICD-10-CM | POA: Diagnosis present

## 2024-05-11 DIAGNOSIS — E872 Acidosis, unspecified: Secondary | ICD-10-CM | POA: Diagnosis present

## 2024-05-11 LAB — URINALYSIS, ROUTINE W REFLEX MICROSCOPIC
Bacteria, UA: NONE SEEN
Bilirubin Urine: NEGATIVE
Glucose, UA: >=500 mg/dL — AB
Hgb urine dipstick: NEGATIVE
Ketones, ur: 20 mg/dL — AB
Leukocytes,Ua: NEGATIVE
Nitrite: NEGATIVE
Protein, ur: 30 mg/dL — AB
Specific Gravity, Urine: 1.039 — ABNORMAL HIGH (ref 1.005–1.030)
pH: 6 (ref 5.0–8.0)

## 2024-05-11 LAB — I-STAT CHEM 8, ED
BUN: 5 mg/dL — ABNORMAL LOW (ref 8–23)
Calcium, Ion: 1.09 mmol/L — ABNORMAL LOW (ref 1.15–1.40)
Chloride: 100 mmol/L (ref 98–111)
Creatinine, Ser: 0.5 mg/dL (ref 0.44–1.00)
Glucose, Bld: 323 mg/dL — ABNORMAL HIGH (ref 70–99)
HCT: 42 % (ref 36.0–46.0)
Hemoglobin: 14.3 g/dL (ref 12.0–15.0)
Potassium: 4.1 mmol/L (ref 3.5–5.1)
Sodium: 135 mmol/L (ref 135–145)
TCO2: 22 mmol/L (ref 22–32)

## 2024-05-11 LAB — CBC
HCT: 41.6 % (ref 36.0–46.0)
Hemoglobin: 12.5 g/dL (ref 12.0–15.0)
MCH: 21.8 pg — ABNORMAL LOW (ref 26.0–34.0)
MCHC: 30 g/dL (ref 30.0–36.0)
MCV: 72.6 fL — ABNORMAL LOW (ref 80.0–100.0)
Platelets: 243 K/uL (ref 150–400)
RBC: 5.73 MIL/uL — ABNORMAL HIGH (ref 3.87–5.11)
RDW: 14.4 % (ref 11.5–15.5)
WBC: 9.1 K/uL (ref 4.0–10.5)
nRBC: 0 % (ref 0.0–0.2)

## 2024-05-11 LAB — COMPREHENSIVE METABOLIC PANEL WITH GFR
ALT: 121 U/L — ABNORMAL HIGH (ref 0–44)
AST: 318 U/L — ABNORMAL HIGH (ref 15–41)
Albumin: 3.8 g/dL (ref 3.5–5.0)
Alkaline Phosphatase: 153 U/L — ABNORMAL HIGH (ref 38–126)
Anion gap: 16 — ABNORMAL HIGH (ref 5–15)
BUN: 6 mg/dL — ABNORMAL LOW (ref 8–23)
CO2: 20 mmol/L — ABNORMAL LOW (ref 22–32)
Calcium: 8.8 mg/dL — ABNORMAL LOW (ref 8.9–10.3)
Chloride: 99 mmol/L (ref 98–111)
Creatinine, Ser: 0.61 mg/dL (ref 0.44–1.00)
GFR, Estimated: >60 mL/min (ref >60)
Glucose, Bld: 323 mg/dL — ABNORMAL HIGH (ref 70–99)
Potassium: 4.3 mmol/L (ref 3.5–5.1)
Sodium: 135 mmol/L (ref 135–145)
Total Bilirubin: 3.2 mg/dL — ABNORMAL HIGH (ref 0.0–1.2)
Total Protein: 7.3 g/dL (ref 6.5–8.1)

## 2024-05-11 LAB — BASIC METABOLIC PANEL WITH GFR
Anion gap: 13 (ref 5–15)
BUN: 8 mg/dL (ref 8–23)
CO2: 23 mmol/L (ref 22–32)
Calcium: 8.8 mg/dL — ABNORMAL LOW (ref 8.9–10.3)
Chloride: 100 mmol/L (ref 98–111)
Creatinine, Ser: 0.57 mg/dL (ref 0.44–1.00)
GFR, Estimated: >60 mL/min (ref >60)
Glucose, Bld: 212 mg/dL — ABNORMAL HIGH (ref 70–99)
Potassium: 4.1 mmol/L (ref 3.5–5.1)
Sodium: 136 mmol/L (ref 135–145)

## 2024-05-11 LAB — SURGICAL PCR SCREEN
MRSA, PCR: NEGATIVE
Staphylococcus aureus: NEGATIVE

## 2024-05-11 LAB — HEMOGLOBIN A1C
Hgb A1c MFr Bld: 10.7 % — ABNORMAL HIGH (ref 4.8–5.6)
Mean Plasma Glucose: 260.39 mg/dL

## 2024-05-11 LAB — LIPASE, BLOOD: Lipase: 40 U/L (ref 11–51)

## 2024-05-11 LAB — GLUCOSE, CAPILLARY
Glucose-Capillary: 305 mg/dL — ABNORMAL HIGH (ref 70–99)
Glucose-Capillary: 206 mg/dL — ABNORMAL HIGH (ref 70–99)
Glucose-Capillary: 266 mg/dL — ABNORMAL HIGH (ref 70–99)

## 2024-05-11 MED ORDER — SODIUM CHLORIDE 0.9 % IV SOLN
1.5000 g | Freq: Four times a day (QID) | INTRAVENOUS | Status: DC
  Administered 2024-05-11 – 2024-05-13 (×6): 1.5 g via INTRAVENOUS
  Filled 2024-05-11 (×9): qty 4

## 2024-05-11 MED ORDER — LACTATED RINGERS IV BOLUS
1000.0000 mL | Freq: Once | INTRAVENOUS | Status: AC
  Administered 2024-05-11: 1000 mL via INTRAVENOUS

## 2024-05-11 MED ORDER — INSULIN GLARGINE 100 UNIT/ML ~~LOC~~ SOLN
10.0000 [IU] | Freq: Every day | SUBCUTANEOUS | Status: DC
  Administered 2024-05-12: 10 [IU] via SUBCUTANEOUS
  Filled 2024-05-11: qty 0.1

## 2024-05-11 MED ORDER — INSULIN ASPART 100 UNIT/ML IJ SOLN
6.0000 [IU] | Freq: Once | INTRAMUSCULAR | Status: AC
Start: 2024-05-11 — End: 2024-05-11
  Administered 2024-05-11: 6 [IU] via SUBCUTANEOUS

## 2024-05-11 MED ORDER — INSULIN GLARGINE 100 UNIT/ML ~~LOC~~ SOLN
6.0000 [IU] | Freq: Once | SUBCUTANEOUS | Status: AC
  Administered 2024-05-11: 6 [IU] via SUBCUTANEOUS
  Filled 2024-05-11: qty 0.06

## 2024-05-11 MED ORDER — ONDANSETRON HCL 4 MG/2ML IJ SOLN: 4.0000 mg | Freq: Four times a day (QID) | INTRAMUSCULAR | Status: DC | PRN

## 2024-05-11 MED ORDER — OXYCODONE HCL 5 MG PO TABS
5.0000 mg | ORAL_TABLET | ORAL | Status: DC | PRN
  Administered 2024-05-12 – 2024-05-13 (×4): 5 mg via ORAL
  Filled 2024-05-11 (×4): qty 1

## 2024-05-11 MED ORDER — ONDANSETRON HCL 4 MG/2ML IJ SOLN
4.0000 mg | Freq: Once | INTRAMUSCULAR | Status: AC
  Administered 2024-05-11: 4 mg via INTRAVENOUS
  Filled 2024-05-11: qty 2

## 2024-05-11 MED ORDER — MORPHINE SULFATE (PF) 4 MG/ML IV SOLN
4.0000 mg | Freq: Once | INTRAVENOUS | Status: AC
  Administered 2024-05-11: 4 mg via INTRAVENOUS
  Filled 2024-05-11: qty 1

## 2024-05-11 MED ORDER — INSULIN ASPART 100 UNIT/ML IJ SOLN: 0.0000 [IU] | INTRAMUSCULAR | Status: DC | PRN

## 2024-05-11 MED ORDER — INSULIN GLARGINE-YFGN 100 UNIT/ML ~~LOC~~ SOLN: 10.0000 [IU] | Freq: Every day | SUBCUTANEOUS | Status: DC

## 2024-05-11 MED ORDER — INSULIN ASPART 100 UNIT/ML IJ SOLN
0.0000 [IU] | Freq: Three times a day (TID) | INTRAMUSCULAR | Status: DC
  Administered 2024-05-12: 2 [IU] via SUBCUTANEOUS
  Administered 2024-05-12: 5 [IU] via SUBCUTANEOUS

## 2024-05-11 MED ORDER — GADOBUTROL 1 MMOL/ML IV SOLN
5.5000 mL | Freq: Once | INTRAVENOUS | Status: AC | PRN
  Administered 2024-05-11: 5.5 mL via INTRAVENOUS

## 2024-05-11 MED ORDER — IBUPROFEN 200 MG PO TABS
600.0000 mg | ORAL_TABLET | Freq: Four times a day (QID) | ORAL | Status: DC | PRN
  Administered 2024-05-12: 600 mg via ORAL
  Filled 2024-05-11: qty 3

## 2024-05-11 NOTE — Consult Note (Addendum)
 Consultation  Referring Provider: [facility]/Robinson PA-C Primary Care Physician:  Nooruddin, Saad, MD Primary Gastroenterologist:  Dr.Armbruster  Reason for Consultation: Acute epigastric pain nausea vomiting  HPI: Elizabeth Saunders is a 68 y.o. non-English-speaking Montagnard female with history of colon cancer 2019 status post partial colectomy and end colostomy/stage III disease.  She underwent chemotherapy and radiation and has been followed by Dr. Cloretta.  Currently in remission.  She also has history of multiple adenomatous colon polyps and last had colonoscopy in December 2022 per Dr. Leigh with multiple polyps removed/tubular adenomas.  Indicated for 3-year interval follow-up.  Patient presented to the emergency room early this morning after she had onset of acute right upper quadrant pain radiating around into her back yesterday.  The pain was constant and associated with an episode of nausea and vomiting per her son.  Due to persistence of pain they came to the emergency room.  No associated fever or chills at home, no hematemesis, no diarrhea.  She had been in her normal state of health until acute onset of symptoms.  Workup in the emergency room with WBC 9.1/hemoglobin 12.5/Mehta crit 41.6/MCV 72 Sodium 135/potassium 4.3/glucose 323 T. bili 3.2/alk phos 153/AST 318/ALT 121.  Upper abdominal ultrasound shows multiple gallstones, no focal liver lesions, no definite gallbladder wall thickening but gallbladder distended and CBD of 8.5 mm, pancreas not seen.  MRI/MRCP has been done that shows diffuse hepatic steatosis, gallbladder appears distended and there is mild Abran cholecystic fluid, numerous tiny stones in the gallbladder measuring up to 3 mm, CBD 8 mm and finding of several small stones in the distal common bile duct measuring up to 3 mm.  Normal appearance of the pancreas, no abdominal adenopathy.  Patient is still feeling discomfort in the epigastrium/right upper quadrant but  not severe. She has been hemodynamically stable and afebrile.   Past Medical History:  Diagnosis Date   Anemia 12/16/2017   Cancer [facility])    colorectal cancer 2019   History of blood transfusion    History of chemotherapy     Past Surgical History:  Procedure Laterality Date   COLON RESECTION N/A 12/18/2017   Procedure: COLON RESECTION;  Surgeon: Signe Mitzie LABOR, MD;  Location: [facility] OR;  Service: General;  Laterality: N/A;   COLOSTOMY N/A 12/18/2017   Procedure: COLOSTOMY;  Surgeon: Signe Mitzie LABOR, MD;  Location: [facility] OR;  Service: General;  Laterality: N/A;   COLOSTOMY TAKEDOWN N/A 10/05/2018   Procedure: LAPAROSCOPIC CONVERTED TO OPEN REVERSAL OF END COLOSTOMY;  Surgeon: Signe Mitzie LABOR, MD;  Location: [facility] ORS;  Service: General;  Laterality: N/A;   CYSTOSCOPY WITH STENT PLACEMENT Left 10/05/2018   Procedure: CYSTOSCOPY WITH STENT PLACEMENT;  Surgeon: Ottelin, Mark, MD;  Location: [facility] ORS;  Service: Urology;  Laterality: Left;   FLEXIBLE SIGMOIDOSCOPY N/A 12/17/2017   Procedure: FLEXIBLE SIGMOIDOSCOPY;  Surgeon: Leigh Elspeth SQUIBB, MD;  Location: [facility] ENDOSCOPY;  Service: Gastroenterology;  Laterality: N/A;   ILEOSTOMY  10/05/2018   Procedure: ILEOSTOMY;  Surgeon: Signe Mitzie LABOR, MD;  Location: [facility] ORS;  Service: General;;   ILEOSTOMY CLOSURE N/A 03/27/2019   Procedure: REVERSAL OF LOOP ILEOSTOMY;  Surgeon: Signe Mitzie LABOR, MD;  Location: [facility] ORS;  Service: General;  Laterality: N/A;   IR FLUORO GUIDE PORT INSERTION RIGHT  01/02/2018   IR US  GUIDE VASC ACCESS RIGHT  01/02/2018   LAPAROSCOPIC LYSIS OF ADHESIONS  10/05/2018   Procedure: LAPAROSCOPIC LYSIS OF ADHESIONS EXTENSIVE;  Surgeon: Signe Mitzie LABOR, MD;  Location: [facility];  Service: General;;   PORT-A-CATH REMOVAL N/A 10/05/2018   Procedure: REMOVAL PORT-A-CATH;  Surgeon: Signe Mitzie LABOR, MD;  Location: [facility] ORS;  Service: General;  Laterality: N/A;    Prior to Admission medications   Medication Sig Start Date End Date Taking?  Authorizing Provider  acetaminophen  (TYLENOL ) 325 MG tablet Take 650 mg by mouth every 6 (six) hours as needed. Patient not taking: Reported on 05/11/2024    [provider]  ferrous sulfate  325 (65 FE) MG EC tablet Take 1 tablet every other day Patient not taking: Reported on 05/11/2024 12/30/22   Lou Claretta HERO, MD    No current facility-administered medications for this encounter.   Current Outpatient Medications  Medication Sig Dispense Refill   acetaminophen  (TYLENOL ) 325 MG tablet Take 650 mg by mouth every 6 (six) hours as needed. (Patient not taking: Reported on 05/11/2024)     ferrous sulfate  325 (65 FE) MG EC tablet Take 1 tablet every other day (Patient not taking: Reported on 05/11/2024) 60 tablet 1    Allergies as of 05/11/2024   (No Active Allergies)    Family History  Problem Relation Age of Onset   Colon cancer Neg Hx    Esophageal cancer Neg Hx    Rectal cancer Neg Hx    Stomach cancer Neg Hx     Social History   Socioeconomic History   Marital status: Single    Spouse name: Not on file   Number of children: 1   Years of education: Not on file   Highest education level: Not on file  Occupational History   Not on file  Tobacco Use   Smoking status: Never   Smokeless tobacco: Never  Vaping Use   Vaping status: Never Used  Substance and Sexual Activity   Alcohol use: No    Alcohol/week: 0.0 standard drinks of alcohol   Drug use: No   Sexual activity: Not on file  Other Topics Concern   Not on file  Social History Narrative   Unable to ask intimate partner violence questions, daughter present   Social Drivers of Corporate investment banker Strain: Not on file  Food Insecurity: Not on file  Transportation Needs: Not on file  Physical Activity: Not on file  Stress: Not on file  Social Connections: Not on file  Intimate Partner Violence: Not on file    Review of Systems: Pertinent positive and negative review of systems were noted in  the above HPI section.  All other review of systems was otherwise negative.   Physical Exam: Vital signs in last 24 hours: Temp:  [97.7 F (36.5 C)-98.5 F (36.9 C)] 98 F (36.7 C) (09/13 1129) Pulse Rate:  [62-71] 62 (09/13 1128) Resp:  [20-24] 20 (09/13 1128) BP: (124-162)/(62-72) 124/72 (09/13 1128) SpO2:  [100 %] 100 % (09/13 1128)   General:   Alert,  Well-developed, well-nourished, older Asian female pleasant and cooperative in NAD son at bedside who interprets for patient Head:  Normocephalic and atraumatic. Eyes:  Sclera with early icterus conjunctiva pink. Ears:  Normal auditory acuity. Nose:  No deformity, discharge,  or lesions. Mouth:  No deformity or lesions.   Neck:  Supple; no masses or thyromegaly. Lungs:  Clear throughout to auscultation.   No wheezes, crackles, or rhonchi.  Heart:  Regular rate and rhythm; no murmurs, clicks, rubs,  or gallops. Abdomen:  Soft, tender in the right upper quadrant/epigastrium no guarding or rebound, no palpable mass or hepatosplenomegaly, she  has several healed incisional scars Rectal: Not done Msk:  Symmetrical without gross deformities. . Pulses:  Normal pulses noted. Extremities:  Without clubbing or edema. Neurologic:  Alert and  oriented x4;  grossly normal neurologically. Skin:  Intact without significant lesions or rashes.. Psych:  Alert and cooperative. Normal mood and affect.  Intake/Output from previous day: No intake/output data recorded. Intake/Output this shift: No intake/output data recorded.  Lab Results: Recent Labs    05/11/24 0341 05/11/24 0346  WBC  --  9.1  HGB 14.3 12.5  HCT 42.0 41.6  PLT  --  243   BMET Recent Labs    05/11/24 0341 05/11/24 0346  NA 135 135  K 4.1 4.3  CL 100 99  CO2  --  20*  GLUCOSE 323* 323*  BUN 5* 6*  CREATININE 0.50 0.61  CALCIUM   --  8.8*   LFT Recent Labs    05/11/24 0346  PROT 7.3  ALBUMIN  3.8  AST 318*  ALT 121*  ALKPHOS 153*  BILITOT 3.2*    PT/INR No results for input(s): LABPROT, INR in the last 72 hours. Hepatitis Panel No results for input(s): HEPBSAG, HCVAB, HEPAIGM, HEPBIGM in the last 72 hours.   IMPRESSION:  #64 68 year old [place] female who presented to the emergency room earlier today after onset of acute right upper quadrant and epigastric pain radiating around into her back yesterday.  Pain was persistent and associated with nausea and vomiting. On further questioning she had also noted some darkening of her urine yesterday.  No fever or chills.  No previous similar episodes.  Workup in the ER with labs/abdominal ultrasound and now MRI/MRCP this morning all consistent with acute cholecystitis, and choledocholithiasis.  #2 hyperglycemia #3 previous history of colon cancer 2019 status post partial colectomy, colostomy then reversal.  Stage III at diagnosis, she completed chemotherapy and radiation and has been in remission. #4 history of multiple adenomatous polyps-up-to-date with colonoscopy last done December 2022, indicated for 3-year interval follow-up.  Plan; keep n.p.o. until seen by surgery, then if no plans for surgery today can have clear to full liquid diet Start IV Unasyn  Repeat labs in a.m. check INR Patient will need ERCP with stone extraction.  Procedure was discussed with the patient and her son at bedside today.  We do not have ERCP availability this weekend, and ERCP most likely to be scheduled for Tuesday.  GI will follow with you      Amy Esterwood PA-C 05/11/2024, 11:51 AM   Attending physician's note  I personally saw the patient and performed a substantive portion of the medical decision making process for this encounter (including a complete performance of the key components : MDM, Hx and Exam), in conjunction with the APP.  I agree with the APP's note, impression, and  the management plan for the number and complexity of problems addressed at the encounter for the patient  and take responsibility for that plan with its inherent risk of complications, morbidity, or mortality with additional input as follows.    68 year old female with history of colon cancer 2019 status post resection, chemoradiation in clinical remission presented with severe epigastric abdominal pain, nausea and vomiting  On exam tenderness in the right upper quadrant and epigastric regions, no distention or rebound  Abdominal ultrasound with distended gallbladder and gallstones MRI MRCP showed distended gallbladder with Peri cholecystic fluid, multiple gallstones, dilated CBD and small CBD stones  Patient will need cholecystectomy and also ERCP for CBD stone extraction Start  broad-spectrum IV antibiotics, IV Unasyn  Follow-up daily LFT  No ERCP coverage this weekend, earliest ERCP can be scheduled is likely Tuesday  GI will continue to follow along  High complex medical decision making. This includes chart review, review of results, face-to-face time used for counseling as well as treatment plan and follow-up. The patient was provided an opportunity to ask questions and all were answered. The patient agreed with the plan and demonstrated an understanding of the instructions.  LOIS Wilkie Mcgee , MD [phone]

## 2024-05-11 NOTE — H&P (Signed)
 Date: 05/11/2024               Patient Name:  Elizabeth Saunders MRN: [id number]  DOB: [birth year] Age / Sex: 68 y.o., female   PCP: Nelia Dirks, MD         Medical Service: Internal Medicine Teaching Service         Attending Physician: Dr. Reyes Fenton      First Contact: Schuyler Novak, DO    Second Contact: Dr. Ozell Kung, MD          Pager Information: First Contact Pager: [phone]   Second Contact Pager: [phone]   SUBJECTIVE   Chief Complaint: Abdominal pain  History of Present Illness: Elizabeth Saunders is a 68 y.o. female with PMH of adenocarcinoma of the rectum who presented today with RUQ pain with associated nausea. She reports that she has been having constant, dull pain in the RUQ that is more painful if you push on it. She has had associated nausea and 1 episode of emesis. She denies any changes in stool patterns. Nothing has made it better or worse. She does admit that she has had similar pains in the past, but they have not been persistent and generally go away quickly. She cannot elicit an inciting factor. The pain is significant enough that it makes her breath catch. She denies CP, SOB, palpitations, dysuria, or frequency.    ED Course: Labs significant for  UA: Glucose >500, Ketones 20,  CMP: AG 16, CO2 20 AST 318, ALT 121, ALP 153 Imaging RUQ US  with gallstones with the gallbladder without wall thickening Common bile duct measuring 8.16mm with intrahepatic bile duct dilatation MRCP with numerous small stones in distal CBD, gallbladder distention and mild pericholecystic fluid. Diffuse hepatic steatosis Hepatic steatosis Received Morphine , zofran , LR Consulted GI, Sx, IMTS  Meds:  Patient reported: No prescription medications  No outpatient medications have been marked as taking for the 05/11/24 encounter [facility] Encounter).    Past Medical History Adenocarcinoma of rectum   Past Surgical History Past Surgical History:  Procedure Laterality Date   COLON  RESECTION N/A 12/18/2017   Procedure: COLON RESECTION;  Surgeon: Signe Mitzie LABOR, MD;  Location: [facility] OR;  Service: General;  Laterality: N/A;   COLOSTOMY N/A 12/18/2017   Procedure: COLOSTOMY;  Surgeon: Signe Mitzie LABOR, MD;  Location: [facility] OR;  Service: General;  Laterality: N/A;   COLOSTOMY TAKEDOWN N/A 10/05/2018   Procedure: LAPAROSCOPIC CONVERTED TO OPEN REVERSAL OF END COLOSTOMY;  Surgeon: Signe Mitzie LABOR, MD;  Location: [facility];  Service: General;  Laterality: N/A;   CYSTOSCOPY WITH STENT PLACEMENT Left 10/05/2018   Procedure: CYSTOSCOPY WITH STENT PLACEMENT;  Surgeon: Ottelin, Mark, MD;  Location: [facility];  Service: Urology;  Laterality: Left;   FLEXIBLE SIGMOIDOSCOPY N/A 12/17/2017   Procedure: FLEXIBLE SIGMOIDOSCOPY;  Surgeon: Leigh Elspeth SQUIBB, MD;  Location: [facility] ENDOSCOPY;  Service: Gastroenterology;  Laterality: N/A;   ILEOSTOMY  10/05/2018   Procedure: ILEOSTOMY;  Surgeon: Signe Mitzie LABOR, MD;  Location: [facility];  Service: General;;   ILEOSTOMY CLOSURE N/A 03/27/2019   Procedure: REVERSAL OF LOOP ILEOSTOMY;  Surgeon: Signe Mitzie LABOR, MD;  Location: [facility];  Service: General;  Laterality: N/A;   IR FLUORO GUIDE PORT INSERTION RIGHT  01/02/2018   IR US  GUIDE VASC ACCESS RIGHT  01/02/2018   LAPAROSCOPIC LYSIS OF ADHESIONS  10/05/2018   Procedure: LAPAROSCOPIC LYSIS OF ADHESIONS EXTENSIVE;  Surgeon: Signe Mitzie LABOR, MD;  Location: [facility];  Service: General;;  PORT-A-CATH REMOVAL N/A 10/05/2018   Procedure: REMOVAL PORT-A-CATH;  Surgeon: Signe Mitzie LABOR, MD;  Location: [facility] ORS;  Service: General;  Laterality: N/A;     Social:  Lives With: Occupation: Support: Level of Function: PCP:  Nelia Dirks, MD  Substances: -Tobacco: denies -Alcohol: denies -Recreational Drug: denies  Family History:  Family History  Problem Relation Age of Onset   Colon cancer Neg Hx    Esophageal cancer Neg Hx    Rectal cancer Neg Hx    Stomach cancer Neg Hx      Allergies: Allergies as of  05/11/2024   (No Active Allergies)    Review of Systems: A complete ROS was negative except as per HPI.   OBJECTIVE:   Physical Exam: Blood pressure 117/72, pulse 67, temperature 98 F (36.7 C), temperature source Oral, resp. rate 18, SpO2 97%.  Constitutional: well-appearing female, in no acute distress HENT: normocephalic atraumatic, mucous membranes moist Eyes: conjunctiva non-erythematous Cardiovascular: regular rate and rhythm, no m/r/g Pulmonary/Chest: normal work of breathing on room air, lungs clear to auscultation bilaterally Abdominal: soft, non distended, tender to palpation in RUQ with positive murphys sign MSK: normal bulk and tone Skin: warm and dry  Labs: CBC    Component Value Date/Time   WBC 9.1 05/11/2024 0346   RBC 5.73 (H) 05/11/2024 0346   HGB 12.5 05/11/2024 0346   HGB 11.4 12/28/2022 0937   HCT 41.6 05/11/2024 0346   HCT 38.5 12/28/2022 0937   PLT 243 05/11/2024 0346   PLT 236 12/28/2022 0937   MCV 72.6 (L) 05/11/2024 0346   MCV 70 (L) 12/28/2022 0937   MCH 21.8 (L) 05/11/2024 0346   MCHC 30.0 05/11/2024 0346   RDW 14.4 05/11/2024 0346   RDW 16.8 (H) 12/28/2022 0937   LYMPHSABS 1.3 06/22/2021 0955   MONOABS 0.5 03/25/2019 1022   EOSABS 0.2 06/22/2021 0955   BASOSABS 0.0 06/22/2021 0955     CMP     Component Value Date/Time   NA 135 05/11/2024 0346   NA 141 07/18/2019 1102   K 4.3 05/11/2024 0346   CL 99 05/11/2024 0346   CO2 20 (L) 05/11/2024 0346   GLUCOSE 323 (H) 05/11/2024 0346   BUN 6 (L) 05/11/2024 0346   BUN 11 07/18/2019 1102   CREATININE 0.61 05/11/2024 0346   CREATININE 0.72 12/15/2020 0759   CALCIUM  8.8 (L) 05/11/2024 0346   PROT 7.3 05/11/2024 0346   PROT 7.7 07/18/2019 1102   ALBUMIN  3.8 05/11/2024 0346   AST 318 (H) 05/11/2024 0346   AST 25 05/15/2018 0915   ALT 121 (H) 05/11/2024 0346   ALT 15 05/15/2018 0915   ALKPHOS 153 (H) 05/11/2024 0346   BILITOT 3.2 (H) 05/11/2024 0346   BILITOT 0.9 05/15/2018 0915    GFRNONAA >60 05/11/2024 0346   GFRNONAA >60 12/15/2020 0759   GFRAA >60 12/16/2019 1110    Imaging:  MR ABDOMEN MRCP W WO CONTAST Result Date: 05/11/2024 EXAM: MRCP WITH AND WITHOUT IV CONTRAST 05/11/2024 10:13:00 AM TECHNIQUE: Multisequence, multiplanar magnetic resonance images of the abdomen with and without intravenous contrast. MRCP sequences were performed. COMPARISON: None available. CLINICAL HISTORY: RUQ abdominal pain, biliary disease suspected, US  nondiagnostic. FINDINGS: LIVER: There is diffuse hepatic steatosis. No focal liver lesion. GALLBLADDER AND BILIARY SYSTEM: Gallbladder appears distended. There is mild pericholecystic fluid. Numerous tiny stones are identified within the gallbladder lumen measuring up to 3 mm. The common bile duct measures 8 mm, image 12/3. There are several small stones in  the distal common bile duct which measure up to 3 mm, image 13/3. SPLEEN: Normal appearance. PANCREAS/PANCREATIC DUCT: Normal appearance of the pancreas without signs of main duct dilatation, inflammation or mass. ADRENAL GLANDS: Normal. KIDNEYS: Small, bilateral, less than 1 cm kidney cysts. No signs of obstructive uropathy. LYMPH NODES: No enlarged abdominal lymph nodes. VASULATURE: Unremarkable. PERITONEUM: No ascites. ABDOMINAL WALL: No hernia. No mass. BOWEL: Grossly unremarkable. No bowel obstruction. BONES: No acute abnormality or worrisome osseous lesion. SOFT TISSUES: Unremarkable. MISCELLANEOUS: No follow up imaging recommended. IMPRESSION: 1. Numerous tiny stones in the gallbladder lumen and several small stones in the distal common bile duct. 2. Gallbladder distention and mild pericholecystic fluid. 3. Diffuse hepatic steatosis. No focal liver lesion. Electronically signed by: Waddell Calk MD 05/11/2024 10:42 AM EDT RP Workstation: [id number]   US  Abdomen Limited RUQ (LIVER/GB) Result Date: 05/11/2024 EXAM: Right Upper Quadrant Abdominal Ultrasound TECHNIQUE: Real-time ultrasonography  of the right upper quadrant of the abdomen was performed. COMPARISON: 05/18/2021 CLINICAL HISTORY: RUQ abdominal pain FINDINGS: LIVER: Increased parenchymal echogenicity of the liver. No focal liver lesion. The portal vein is patent with normal flow towards the liver. BILIARY SYSTEM: Multiple stones identified within the gallbladder. No gallbladder wall thickening, pericholecystic fluid or sonographic Murphy's sign. Increased caliber of the common bile duct with intrahepatic bile duct dilatation noted. The common bile duct measures 8.5 mm. RIGHT KIDNEY: Visualized portions of the right kidney appear normal. PANCREAS: Pancreas is not well visualized. OTHER: No right upper quadrant ascites. IMPRESSION: 1. Multiple gallstones within the gallbladder without wall thickening, pericholecystic fluid, or sonographic Murphy sign. 2. Increased caliber of the common bile duct measuring 8.5 mm with intrahepatic bile duct dilatation. If there is a clinical concern for choledocholithiasis, consider more definitive characterization with MRCP. 3. Hepatic steatosis. Electronically signed by: Waddell Calk MD 05/11/2024 08:29 AM EDT RP Workstation: [id number]     ASSESSMENT & PLAN:   Assessment & Plan by Problem: Principal Problem:   Choledocholithiasis with acute cholecystitis Active Problems:   Type 2 diabetes mellitus without complication, without long-term current use of insulin  (HCC)   Elizabeth Saunders is a 68 y.o. person living with a history of adenocarcinoma of the rectum who presented with RUQ pain and admitted for choledocholithiasis with acute cholecystitis on hospital day 0  Choledocholithiasis with cholecystitis Elevated transaminases -The patient presented with a hx of intermittent RUQ pain, worsening over the past day.  -RUQ US  with multiple gallstones  -MRCP with numerous stones in the distal common bile duct, gallbladder distention and mild pericholecystic fluid, and diffuse hepatic steatosis -AST, ALT, ALP,  and bili elevated on admission, secondary to bile duct blockage will monitor closely.  -GI consulted and reported that the ERCP cannot be performed until Tuesday of next week -Discussed with general surgery who recommended lap chole today, however the patient had eaten lunch, so sx deferred until tomorrow  -Continue zofran  for nausea -Continue ibuprofen . Oxycodone  as needed for severe pain -Continue Unasyn    New-onset T2DM -UA with >500 glucose and CBG elevated 323 -Pt denies diagnosis of diabetes in the past and has never been treated.  -Will initiate insulin  here at 10 units glargine daily.  -A1C pending  Anion Gap Metabolic acidosis -UA with ketones. AG elevated at 16 with CO2 20. -Indicative of mild metabolic acidosis, potentially a DKA process, incited by acute gallbladder inflammation and infection.  -Will continue to monitor closely with serial CMP's and CBG checks.  -Pt received 1L LR in ED. Will administer more fluids  if indicated. -Pt NPO for surgery tomorrow.   Best practice: Diet: NPO VTE: On hold due to sx tomorrow IVF: pending,  Code: Full  Disposition planning: Prior to Admission Living Arrangement: Home, living with son Anticipated Discharge Location: Home  Dispo: Admit patient to Inpatient with expected length of stay greater than 2 midnights.  Signed: Myrna Bitters, DO Internal Medicine Resident  05/11/2024, 6:28 PM  On Call pager: [phone]

## 2024-05-11 NOTE — ED Notes (Signed)
 Awaiting ampicillin  to be verified

## 2024-05-11 NOTE — ED Provider Notes (Signed)
 [place] EMERGENCY DEPARTMENT AT [facility] Provider Note   CSN: [id number] Arrival date & time: 05/11/24  9675     Patient presents with: Abdominal Pain  HPI Kynnedy Shippee is a 68 y.o. female with history of colon cancer presenting for abdominal pain.  Started yesterday after noon.  Located in the right upper quadrant and radiates to the epigastric region.  Endorses nausea and vomiting.  Had a normal bowel movement yesterday.  Denies fever.  Denies urinary symptoms.  States she is followed by home for her cancer and has been in remission for over a year.    Abdominal Pain      Prior to Admission medications   Medication Sig Start Date End Date Taking? Authorizing Provider  acetaminophen  (TYLENOL ) 325 MG tablet Take 650 mg by mouth every 6 (six) hours as needed.    [provider]  ferrous sulfate  325 (65 FE) MG EC tablet Take 1 tablet every other day 12/30/22   Lou Claretta HERO, MD    Allergies: Patient has no active allergies.    Review of Systems  Gastrointestinal:  Positive for abdominal pain.    Updated Vital Signs BP (!) 152/70 (BP Location: Right Arm)   Pulse 62   Temp 98.1 F (36.7 C) (Oral)   Resp (!) 24   SpO2 100%   Physical Exam Vitals and nursing note reviewed.  HENT:     Head: Normocephalic and atraumatic.     Mouth/Throat:     Mouth: Mucous membranes are moist.  Eyes:     General:        Right eye: No discharge.        Left eye: No discharge.     Conjunctiva/sclera: Conjunctivae normal.  Cardiovascular:     Rate and Rhythm: Normal rate and regular rhythm.     Pulses: Normal pulses.     Heart sounds: Normal heart sounds.  Pulmonary:     Effort: Pulmonary effort is normal.     Breath sounds: Normal breath sounds.  Abdominal:     General: Abdomen is flat. There is no distension.     Palpations: Abdomen is soft.     Tenderness: There is abdominal tenderness in the right upper quadrant.  Skin:    General: Skin is warm and  dry.  Neurological:     General: No focal deficit present.  Psychiatric:        Mood and Affect: Mood normal.     (all labs ordered are listed, but only abnormal results are displayed) Labs Reviewed  COMPREHENSIVE METABOLIC PANEL WITH GFR - Abnormal; Notable for the following components:      Result Value   CO2 20 (*)    Glucose, Bld 323 (*)    BUN 6 (*)    Calcium  8.8 (*)    AST 318 (*)    ALT 121 (*)    Alkaline Phosphatase 153 (*)    Total Bilirubin 3.2 (*)    Anion gap 16 (*)    All other components within normal limits  CBC - Abnormal; Notable for the following components:   RBC 5.73 (*)    MCV 72.6 (*)    MCH 21.8 (*)    All other components within normal limits  URINALYSIS, ROUTINE W REFLEX MICROSCOPIC - Abnormal; Notable for the following components:   Specific Gravity, Urine 1.039 (*)    Glucose, UA >=500 (*)    Ketones, ur 20 (*)    Protein, ur 30 (*)  All other components within normal limits  I-STAT CHEM 8, ED - Abnormal; Notable for the following components:   BUN 5 (*)    Glucose, Bld 323 (*)    Calcium , Ion 1.09 (*)    All other components within normal limits  LIPASE, BLOOD    EKG: None  Radiology: US  Abdomen Limited RUQ (LIVER/GB) Result Date: 05/11/2024 EXAM: Right Upper Quadrant Abdominal Ultrasound TECHNIQUE: Real-time ultrasonography of the right upper quadrant of the abdomen was performed. COMPARISON: 05/18/2021 CLINICAL HISTORY: RUQ abdominal pain FINDINGS: LIVER: Increased parenchymal echogenicity of the liver. No focal liver lesion. The portal vein is patent with normal flow towards the liver. BILIARY SYSTEM: Multiple stones identified within the gallbladder. No gallbladder wall thickening, pericholecystic fluid or sonographic Murphy's sign. Increased caliber of the common bile duct with intrahepatic bile duct dilatation noted. The common bile duct measures 8.5 mm. RIGHT KIDNEY: Visualized portions of the right kidney appear normal. PANCREAS:  Pancreas is not well visualized. OTHER: No right upper quadrant ascites. IMPRESSION: 1. Multiple gallstones within the gallbladder without wall thickening, pericholecystic fluid, or sonographic Murphy sign. 2. Increased caliber of the common bile duct measuring 8.5 mm with intrahepatic bile duct dilatation. If there is a clinical concern for choledocholithiasis, consider more definitive characterization with MRCP. 3. Hepatic steatosis. Electronically signed by: Waddell Calk MD 05/11/2024 08:29 AM EDT RP Workstation: [id number]     Procedures   Medications Ordered in the ED  gadobutrol  (GADAVIST ) 1 MMOL/ML injection 5.5 mL (has no administration in time range)  lactated ringers  bolus 1,000 mL (0 mLs Intravenous Stopped 05/11/24 0845)  ondansetron  (ZOFRAN ) injection 4 mg (4 mg Intravenous Given 05/11/24 0743)  morphine  (PF) 4 MG/ML injection 4 mg (4 mg Intravenous Given 05/11/24 0743)                                    Medical Decision Making Amount and/or Complexity of Data Reviewed Labs: ordered. Radiology: ordered.  Risk Prescription drug management.   Initial Impression and Ddx 68 year old well-appearing female presenting for abdominal pain.  Exam notable for right upper quadrant tenderness.  DDx includes acute cholecystitis, choledocholithiasis, pyelonephritis, PE, kidney stone, bowel obstruction, other. Patient PMH that increases complexity of ED encounter:  history of colon cancer   Interpretation of Diagnostics - I independent reviewed and interpreted the labs as followed: Elevated AST ALT alk phos and total bili  - I independently visualized the following imaging with scope of interpretation limited to determining acute life threatening conditions related to emergency care: RUQ US , which revealed gallstones and common bile duct dilatation  Patient Reassessment and Ultimate Disposition/Management On reassessment still in pain but improved.  Workup suggestive of  choledocholithiasis.  Discussed patient with NP Amy Esterwood who advised MRCP and medical admission.  Patient remained stable on room air.  Admitted to internal medicine service with Dr. Norrine.  Patient management required discussion with the following services or consulting groups:  Hospitalist Service and Gastroenterology  Complexity of Problems Addressed Acute complicated illness or Injury  Additional Data Reviewed and Analyzed Further history obtained from: Past medical history and medications listed in the EMR and Prior ED visit notes  Patient Encounter Risk Assessment Consideration of hospitalization      Final diagnoses:  RUQ abdominal pain    ED Discharge Orders     None          Lang Norleen POUR, PA-C 05/11/24 1008  Bari Charmaine FALCON, MD 05/11/24 2328

## 2024-05-11 NOTE — H&P (Incomplete)
 Internal Medicine Teaching Service Attending Note Date: 05/11/2024  Patient name: Elizabeth Saunders  Medical record number: [id number]  Date of birth: 11/25/1955   I have seen and evaluated Laurel Worthington and discussed their care with the Residency Team.   68 yo f with hx of prev colon cancer (2019) now considered in remission.  Presents with RUQ pain, radiating to her back for last 8 hours (roughly).  She is currently denying pain. She was found to have cholecystitis and choledocholithiasis on CT and was planned for surgery today. She ate at 11am and her surgery is now deferred to 9-14.  She is afebrile and her WBC is normal.  She is noted to have Glc of 323 (new!).    Physical Exam: Blood pressure 124/82, pulse 74, temperature 98.3 F (36.8 C), temperature source Oral, resp. rate 20, SpO2 97%. General appearance: alert, cooperative, and no distress Resp: clear to auscultation bilaterally Cardio: regular rate and rhythm GI: normal findings: bowel sounds normal and mild RUQ tenderness with deep palpation.  Extremities: edema none  Lab results: Results for orders placed or performed during the hospital encounter of 05/11/24 (from the past 24 hours)  Urinalysis, Routine w reflex microscopic -Urine, Clean Catch     Status: Abnormal   Collection Time: 05/11/24  3:34 AM  Result Value Ref Range   Color, Urine YELLOW YELLOW   APPearance CLEAR CLEAR   Specific Gravity, Urine 1.039 (H) 1.005 - 1.030   pH 6.0 5.0 - 8.0   Glucose, UA >=500 (A) NEGATIVE mg/dL   Hgb urine dipstick NEGATIVE NEGATIVE   Bilirubin Urine NEGATIVE NEGATIVE   Ketones, ur 20 (A) NEGATIVE mg/dL   Protein, ur 30 (A) NEGATIVE mg/dL   Nitrite NEGATIVE NEGATIVE   Leukocytes,Ua NEGATIVE NEGATIVE   RBC / HPF 0-5 0 - 5 RBC/hpf   WBC, UA 0-5 0 - 5 WBC/hpf   Bacteria, UA NONE SEEN NONE SEEN   Squamous Epithelial / HPF 0-5 0 - 5 /HPF  I-stat chem 8, ed     Status: Abnormal   Collection Time: 05/11/24  3:41 AM  Result Value Ref Range    Sodium 135 135 - 145 mmol/L   Potassium 4.1 3.5 - 5.1 mmol/L   Chloride 100 98 - 111 mmol/L   BUN 5 (L) 8 - 23 mg/dL   Creatinine, Ser 9.49 0.44 - 1.00 mg/dL   Glucose, Bld 676 (H) 70 - 99 mg/dL   Calcium , Ion 1.09 (L) 1.15 - 1.40 mmol/L   TCO2 22 22 - 32 mmol/L   Hemoglobin 14.3 12.0 - 15.0 g/dL   HCT 57.9 63.9 - 53.9 %  Lipase, blood     Status: None   Collection Time: 05/11/24  3:46 AM  Result Value Ref Range   Lipase 40 11 - 51 U/L  Comprehensive metabolic panel     Status: Abnormal   Collection Time: 05/11/24  3:46 AM  Result Value Ref Range   Sodium 135 135 - 145 mmol/L   Potassium 4.3 3.5 - 5.1 mmol/L   Chloride 99 98 - 111 mmol/L   CO2 20 (L) 22 - 32 mmol/L   Glucose, Bld 323 (H) 70 - 99 mg/dL   BUN 6 (L) 8 - 23 mg/dL   Creatinine, Ser 9.38 0.44 - 1.00 mg/dL   Calcium  8.8 (L) 8.9 - 10.3 mg/dL   Total Protein 7.3 6.5 - 8.1 g/dL   Albumin  3.8 3.5 - 5.0 g/dL   AST 681 (H) 15 - 41  U/L   ALT 121 (H) 0 - 44 U/L   Alkaline Phosphatase 153 (H) 38 - 126 U/L   Total Bilirubin 3.2 (H) 0.0 - 1.2 mg/dL   GFR, Estimated >39 >39 mL/min   Anion gap 16 (H) 5 - 15  CBC     Status: Abnormal   Collection Time: 05/11/24  3:46 AM  Result Value Ref Range   WBC 9.1 4.0 - 10.5 K/uL   RBC 5.73 (H) 3.87 - 5.11 MIL/uL   Hemoglobin 12.5 12.0 - 15.0 g/dL   HCT 58.3 63.9 - 53.9 %   MCV 72.6 (L) 80.0 - 100.0 fL   MCH 21.8 (L) 26.0 - 34.0 pg   MCHC 30.0 30.0 - 36.0 g/dL   RDW 85.5 88.4 - 84.4 %   Platelets 243 150 - 400 K/uL   nRBC 0.0 0.0 - 0.2 %  Glucose, capillary     Status: Abnormal   Collection Time: 05/11/24  2:02 PM  Result Value Ref Range   Glucose-Capillary 305 (H) 70 - 99 mg/dL    Imaging results:  MR ABDOMEN MRCP W WO CONTAST Result Date: 05/11/2024 EXAM: MRCP WITH AND WITHOUT IV CONTRAST 05/11/2024 10:13:00 AM TECHNIQUE: Multisequence, multiplanar magnetic resonance images of the abdomen with and without intravenous contrast. MRCP sequences were performed. COMPARISON: None  available. CLINICAL HISTORY: RUQ abdominal pain, biliary disease suspected, US  nondiagnostic. FINDINGS: LIVER: There is diffuse hepatic steatosis. No focal liver lesion. GALLBLADDER AND BILIARY SYSTEM: Gallbladder appears distended. There is mild pericholecystic fluid. Numerous tiny stones are identified within the gallbladder lumen measuring up to 3 mm. The common bile duct measures 8 mm, image 12/3. There are several small stones in the distal common bile duct which measure up to 3 mm, image 13/3. SPLEEN: Normal appearance. PANCREAS/PANCREATIC DUCT: Normal appearance of the pancreas without signs of main duct dilatation, inflammation or mass. ADRENAL GLANDS: Normal. KIDNEYS: Small, bilateral, less than 1 cm kidney cysts. No signs of obstructive uropathy. LYMPH NODES: No enlarged abdominal lymph nodes. VASULATURE: Unremarkable. PERITONEUM: No ascites. ABDOMINAL WALL: No hernia. No mass. BOWEL: Grossly unremarkable. No bowel obstruction. BONES: No acute abnormality or worrisome osseous lesion. SOFT TISSUES: Unremarkable. MISCELLANEOUS: No follow up imaging recommended. IMPRESSION: 1. Numerous tiny stones in the gallbladder lumen and several small stones in the distal common bile duct. 2. Gallbladder distention and mild pericholecystic fluid. 3. Diffuse hepatic steatosis. No focal liver lesion. Electronically signed by: Waddell Calk MD 05/11/2024 10:42 AM EDT RP Workstation: [id number]   US  Abdomen Limited RUQ (LIVER/GB) Result Date: 05/11/2024 EXAM: Right Upper Quadrant Abdominal Ultrasound TECHNIQUE: Real-time ultrasonography of the right upper quadrant of the abdomen was performed. COMPARISON: 05/18/2021 CLINICAL HISTORY: RUQ abdominal pain FINDINGS: LIVER: Increased parenchymal echogenicity of the liver. No focal liver lesion. The portal vein is patent with normal flow towards the liver. BILIARY SYSTEM: Multiple stones identified within the gallbladder. No gallbladder wall thickening, pericholecystic fluid or  sonographic Murphy's sign. Increased caliber of the common bile duct with intrahepatic bile duct dilatation noted. The common bile duct measures 8.5 mm. RIGHT KIDNEY: Visualized portions of the right kidney appear normal. PANCREAS: Pancreas is not well visualized. OTHER: No right upper quadrant ascites. IMPRESSION: 1. Multiple gallstones within the gallbladder without wall thickening, pericholecystic fluid, or sonographic Murphy sign. 2. Increased caliber of the common bile duct measuring 8.5 mm with intrahepatic bile duct dilatation. If there is a clinical concern for choledocholithiasis, consider more definitive characterization with MRCP. 3. Hepatic steatosis. Electronically signed by: Waddell  Joan MD 05/11/2024 08:29 AM EDT RP Workstation: [id number]    Assessment and Plan: I agree with the formulated Assessment and Plan with the following changes:  Choledocholithiasis, Cholecystitis New Dx DM2  Will continue her on anbx Watch her exam Surgery in am, WBC  Start her on SSI to get an idea of whether she can be on po or insulin .     Eben, Reyes BROCKS, MD

## 2024-05-11 NOTE — ED Notes (Signed)
Ambulated to bathroom and back.  Tolerated well.

## 2024-05-11 NOTE — Consult Note (Addendum)
 Consulting Physician: Elizabeth Saunders  Referring Provider: Dr. Eben  Chief Complaint: Abdominal pain  Reason for Consult: Cholecystitis and choledocholithiasis   Subjective   HPI: Elizabeth Saunders is an 68 y.o. female who is here for abdominal pain.  She has a history of colon cancer with colectomy and end colostomy, colostomy takedown with loop ileostomy and subsquent ileosotmy takedown.  During her end colostomy takedown surgery, she required a two hour lysis of adhesions and conversion from laparoscopic surgery to open surgery.  At this point her cancer is in remission.  She now presents with abdominal pain.   The pain is located in the right upper quadrant and around to her back.  It is constant and associated with nausea and vomiting.  It started this morning.  She has had some darkening of her urine.  Past Medical History:  Diagnosis Date   Anemia 12/16/2017   Cancer [facility])    colorectal cancer 2019   History of blood transfusion    History of chemotherapy     Past Surgical History:  Procedure Laterality Date   COLON RESECTION N/A 12/18/2017   Procedure: COLON RESECTION;  Surgeon: Signe Mitzie LABOR, MD;  Location[facility] OR;  Service: General;  Laterality: N/A;   COLOSTOMY N/A 12/18/2017   Procedure: COLOSTOMY;  Surgeon: Signe Mitzie LABOR, MD;  Location: [facility] OR;  Service: General;  Laterality: N/A;   COLOSTOMY TAKEDOWN N/A 10/05/2018   Procedure: LAPAROSCOPIC CONVERTED TO OPEN REVERSAL OF END COLOSTOMY;  Surgeon: Signe Mitzie LABOR, MD;  Location: [facility] ORS;  Service: General;  Laterality: N/A;   CYSTOSCOPY WITH STENT PLACEMENT Left 10/05/2018   Procedure: CYSTOSCOPY WITH STENT PLACEMENT;  Surgeon: Ottelin, Mark, MD;  Location: [facility] ORS;  Service: Urology;  Laterality: Left;   FLEXIBLE SIGMOIDOSCOPY N/A 12/17/2017   Procedure: FLEXIBLE SIGMOIDOSCOPY;  Surgeon: Leigh Elspeth SQUIBB, MD;  Location: [facility] ENDOSCOPY;  Service: Gastroenterology;  Laterality: N/A;   ILEOSTOMY  10/05/2018   Procedure:  ILEOSTOMY;  Surgeon: Signe Mitzie LABOR, MD;  Location: [facility] ORS;  Service: General;;   ILEOSTOMY CLOSURE N/A 03/27/2019   Procedure: REVERSAL OF LOOP ILEOSTOMY;  Surgeon: Signe Mitzie LABOR, MD;  Location: [facility] ORS;  Service: General;  Laterality: N/A;   IR FLUORO GUIDE PORT INSERTION RIGHT  01/02/2018   IR US  GUIDE VASC ACCESS RIGHT  01/02/2018   LAPAROSCOPIC LYSIS OF ADHESIONS  10/05/2018   Procedure: LAPAROSCOPIC LYSIS OF ADHESIONS EXTENSIVE;  Surgeon: Signe Mitzie LABOR, MD;  Location: [facility] ORS;  Service: General;;   PORT-A-CATH REMOVAL N/A 10/05/2018   Procedure: REMOVAL PORT-A-CATH;  Surgeon: Signe Mitzie LABOR, MD;  Location: [facility] ORS;  Service: General;  Laterality: N/A;    Family History  Problem Relation Age of Onset   Colon cancer Neg Hx    Esophageal cancer Neg Hx    Rectal cancer Neg Hx    Stomach cancer Neg Hx     Social:  reports that she has never smoked. She has never used smokeless tobacco. She reports that she does not drink alcohol and does not use drugs.  Allergies: No Active Allergies  Medications: Current Outpatient Medications  Medication Instructions   acetaminophen  (TYLENOL ) 650 mg, Every 6 hours PRN   ferrous sulfate  325 (65 FE) MG EC tablet Take 1 tablet every other day    ROS - all of the below systems have been reviewed with the patient and positives are indicated with bold text General: chills, fever or night sweats Eyes: blurry vision or double vision  ENT: epistaxis or sore throat Allergy/Immunology: itchy/watery eyes or nasal congestion Hematologic/Lymphatic: bleeding problems, blood clots or swollen lymph nodes Endocrine: temperature intolerance or unexpected weight changes Breast: new or changing breast lumps or nipple discharge Resp: cough, shortness of breath, or wheezing CV: chest pain or dyspnea on exertion GI: as per HPI GU: dysuria, trouble voiding, or hematuria MSK: joint pain or joint stiffness Neuro: TIA or stroke symptoms Derm: pruritus and skin  lesion changes Psych: anxiety and depression  Objective   PE Blood pressure 124/72, pulse 62, temperature 98 F (36.7 C), temperature source Oral, resp. rate 20, SpO2 100%. Constitutional: NAD; conversant; no deformities Eyes: Moist conjunctiva; no lid lag; anicteric; PERRL Neck: Trachea midline; no thyromegaly Lungs: Normal respiratory effort; no tactile fremitus CV: RRR; no palpable thrills; no pitting edema GI: Abd Soft, tender RUQ; no palpable hepatosplenomegaly MSK: Normal range of motion of extremities; no clubbing/cyanosis Psychiatric: Appropriate affect; alert and oriented x3 Lymphatic: No palpable cervical or axillary lymphadenopathy  Results for orders placed or performed during the hospital encounter of 05/11/24 (from the past 24 hours)  Urinalysis, Routine w reflex microscopic -Urine, Clean Catch     Status: Abnormal   Collection Time: 05/11/24  3:34 AM  Result Value Ref Range   Color, Urine YELLOW YELLOW   APPearance CLEAR CLEAR   Specific Gravity, Urine 1.039 (H) 1.005 - 1.030   pH 6.0 5.0 - 8.0   Glucose, UA >=500 (A) NEGATIVE mg/dL   Hgb urine dipstick NEGATIVE NEGATIVE   Bilirubin Urine NEGATIVE NEGATIVE   Ketones, ur 20 (A) NEGATIVE mg/dL   Protein, ur 30 (A) NEGATIVE mg/dL   Nitrite NEGATIVE NEGATIVE   Leukocytes,Ua NEGATIVE NEGATIVE   RBC / HPF 0-5 0 - 5 RBC/hpf   WBC, UA 0-5 0 - 5 WBC/hpf   Bacteria, UA NONE SEEN NONE SEEN   Squamous Epithelial / HPF 0-5 0 - 5 /HPF  I-stat chem 8, ed     Status: Abnormal   Collection Time: 05/11/24  3:41 AM  Result Value Ref Range   Sodium 135 135 - 145 mmol/L   Potassium 4.1 3.5 - 5.1 mmol/L   Chloride 100 98 - 111 mmol/L   BUN 5 (L) 8 - 23 mg/dL   Creatinine, Ser 9.49 0.44 - 1.00 mg/dL   Glucose, Bld 676 (H) 70 - 99 mg/dL   Calcium , Ion 1.09 (L) 1.15 - 1.40 mmol/L   TCO2 22 22 - 32 mmol/L   Hemoglobin 14.3 12.0 - 15.0 g/dL   HCT 57.9 63.9 - 53.9 %  Lipase, blood     Status: None   Collection Time: 05/11/24   3:46 AM  Result Value Ref Range   Lipase 40 11 - 51 U/L  Comprehensive metabolic panel     Status: Abnormal   Collection Time: 05/11/24  3:46 AM  Result Value Ref Range   Sodium 135 135 - 145 mmol/L   Potassium 4.3 3.5 - 5.1 mmol/L   Chloride 99 98 - 111 mmol/L   CO2 20 (L) 22 - 32 mmol/L   Glucose, Bld 323 (H) 70 - 99 mg/dL   BUN 6 (L) 8 - 23 mg/dL   Creatinine, Ser 9.38 0.44 - 1.00 mg/dL   Calcium  8.8 (L) 8.9 - 10.3 mg/dL   Total Protein 7.3 6.5 - 8.1 g/dL   Albumin  3.8 3.5 - 5.0 g/dL   AST 681 (H) 15 - 41 U/L   ALT 121 (H) 0 - 44 U/L   Alkaline  Phosphatase 153 (H) 38 - 126 U/L   Total Bilirubin 3.2 (H) 0.0 - 1.2 mg/dL   GFR, Estimated >39 >39 mL/min   Anion gap 16 (H) 5 - 15  CBC     Status: Abnormal   Collection Time: 05/11/24  3:46 AM  Result Value Ref Range   WBC 9.1 4.0 - 10.5 K/uL   RBC 5.73 (H) 3.87 - 5.11 MIL/uL   Hemoglobin 12.5 12.0 - 15.0 g/dL   HCT 58.3 63.9 - 53.9 %   MCV 72.6 (L) 80.0 - 100.0 fL   MCH 21.8 (L) 26.0 - 34.0 pg   MCHC 30.0 30.0 - 36.0 g/dL   RDW 85.5 88.4 - 84.4 %   Platelets 243 150 - 400 K/uL   nRBC 0.0 0.0 - 0.2 %     Imaging Orders         US  Abdomen Limited RUQ (LIVER/GB)         MR ABDOMEN MRCP W WO CONTAST      Assessment and Plan   Elizabeth Saunders is an 68 y.o. female with cholecystitis and choledocholithiasis.  GI was consulted and does not have ERCP resources available till later in the week.  I recommend proceeding with laparoscopic, possibly open, cholecystectomy.  We discussed the procedure, its risks, benefits and alternatives and the patient granted consent to proceed.  Risks discussed included but were not limited to the risk of infection, bleeding, damage to nearby structures, bowel injury in the setting of previous abdominal adhesions, bile leak.  Elizabeth Saunders phone interpretation offered, but family declined and son interpreted.  Patient ate lunch today so we will plan for surgery tomorrow.    ICD-10-CM   1. RUQ abdominal  pain  R10.11        Elizabeth JINNY Foy, MD  [facility] Use [email or web address] to contact on call provider  New Patient Billing: [phone] - High MDM

## 2024-05-11 NOTE — Progress Notes (Signed)
 Report called to 5N to Joe RN. Transferred via bed.

## 2024-05-11 NOTE — ED Triage Notes (Signed)
 Pt bib POV c/o upper stomach pain that started yesterday. Pt last BM last night.  Pt endorse N/V   Pt denies fever or bloody stools

## 2024-05-11 NOTE — ED Notes (Signed)
 Patient transported to MRI

## 2024-05-11 NOTE — ED Triage Notes (Signed)
 Pt states that she has been having abd pain since yesterday, with n/v

## 2024-05-12 ENCOUNTER — Inpatient Hospital Stay (HOSPITAL_COMMUNITY): Admitting: Anesthesiology

## 2024-05-12 ENCOUNTER — Other Ambulatory Visit: Payer: Self-pay

## 2024-05-12 ENCOUNTER — Inpatient Hospital Stay (HOSPITAL_COMMUNITY)

## 2024-05-12 ENCOUNTER — Encounter (HOSPITAL_COMMUNITY): Payer: Self-pay | Admitting: Infectious Diseases

## 2024-05-12 ENCOUNTER — Encounter (HOSPITAL_COMMUNITY): Admitting: Anesthesiology

## 2024-05-12 ENCOUNTER — Encounter (HOSPITAL_COMMUNITY)
Admission: EM | Disposition: A | Discharge status: Home / Self Care | Payer: Self-pay | Source: Home / Self Care | Attending: Infectious Diseases

## 2024-05-12 DIAGNOSIS — E119 Type 2 diabetes mellitus without complications: Secondary | ICD-10-CM

## 2024-05-12 DIAGNOSIS — K8064 Calculus of gallbladder and bile duct with chronic cholecystitis without obstruction: Secondary | ICD-10-CM | POA: Diagnosis not present

## 2024-05-12 DIAGNOSIS — Z794 Long term (current) use of insulin: Secondary | ICD-10-CM

## 2024-05-12 DIAGNOSIS — E1169 Type 2 diabetes mellitus with other specified complication: Secondary | ICD-10-CM

## 2024-05-12 HISTORY — PX: CHOLECYSTECTOMY: SHX55

## 2024-05-12 LAB — CBC WITH DIFFERENTIAL/PLATELET
Abs Immature Granulocytes: 0.02 K/uL (ref 0.00–0.07)
Basophils Absolute: 0 K/uL (ref 0.0–0.1)
Basophils Relative: 0 %
Eosinophils Absolute: 0.1 K/uL (ref 0.0–0.5)
Eosinophils Relative: 2 %
HCT: 37.8 % (ref 36.0–46.0)
Hemoglobin: 11.7 g/dL — ABNORMAL LOW (ref 12.0–15.0)
Immature Granulocytes: 0 %
Lymphocytes Relative: 18 %
Lymphs Abs: 1.2 K/uL (ref 0.7–4.0)
MCH: 21.9 pg — ABNORMAL LOW (ref 26.0–34.0)
MCHC: 31 g/dL (ref 30.0–36.0)
MCV: 70.8 fL — ABNORMAL LOW (ref 80.0–100.0)
Monocytes Absolute: 0.5 K/uL (ref 0.1–1.0)
Monocytes Relative: 8 %
Neutro Abs: 4.8 K/uL (ref 1.7–7.7)
Neutrophils Relative %: 72 %
Platelets: 227 K/uL (ref 150–400)
RBC: 5.34 MIL/uL — ABNORMAL HIGH (ref 3.87–5.11)
RDW: 14.6 % (ref 11.5–15.5)
WBC: 6.7 K/uL (ref 4.0–10.5)
nRBC: 0 % (ref 0.0–0.2)

## 2024-05-12 LAB — COMPREHENSIVE METABOLIC PANEL WITH GFR
ALT: 297 U/L — ABNORMAL HIGH (ref 0–44)
AST: 293 U/L — ABNORMAL HIGH (ref 15–41)
Albumin: 3 g/dL — ABNORMAL LOW (ref 3.5–5.0)
Alkaline Phosphatase: 198 U/L — ABNORMAL HIGH (ref 38–126)
Anion gap: 9 (ref 5–15)
BUN: 13 mg/dL (ref 8–23)
CO2: 22 mmol/L (ref 22–32)
Calcium: 8.5 mg/dL — ABNORMAL LOW (ref 8.9–10.3)
Chloride: 105 mmol/L (ref 98–111)
Creatinine, Ser: 0.64 mg/dL (ref 0.44–1.00)
GFR, Estimated: >60 mL/min (ref >60)
Glucose, Bld: 256 mg/dL — ABNORMAL HIGH (ref 70–99)
Potassium: 3.7 mmol/L (ref 3.5–5.1)
Sodium: 136 mmol/L (ref 135–145)
Total Bilirubin: 5.2 mg/dL — ABNORMAL HIGH (ref 0.0–1.2)
Total Protein: 6.4 g/dL — ABNORMAL LOW (ref 6.5–8.1)

## 2024-05-12 LAB — GLUCOSE, CAPILLARY
Glucose-Capillary: 241 mg/dL — ABNORMAL HIGH (ref 70–99)
Glucose-Capillary: 147 mg/dL — ABNORMAL HIGH (ref 70–99)
Glucose-Capillary: 150 mg/dL — ABNORMAL HIGH (ref 70–99)
Glucose-Capillary: 131 mg/dL — ABNORMAL HIGH (ref 70–99)

## 2024-05-12 LAB — HIV ANTIBODY (ROUTINE TESTING W REFLEX): HIV Screen 4th Generation wRfx: NONREACTIVE

## 2024-05-12 LAB — PROTIME-INR
INR: 1.1 (ref 0.8–1.2)
Prothrombin Time: 14.3 s (ref 11.4–15.2)

## 2024-05-12 SURGERY — LAPAROSCOPIC CHOLECYSTECTOMY WITH INTRAOPERATIVE CHOLANGIOGRAM
Anesthesia: General

## 2024-05-12 MED ORDER — FENTANYL CITRATE (PF) 250 MCG/5ML IJ SOLN
INTRAMUSCULAR | Status: AC
  Filled 2024-05-12: qty 5

## 2024-05-12 MED ORDER — ACETAMINOPHEN 10 MG/ML IV SOLN
1000.0000 mg | Freq: Once | INTRAVENOUS | Status: DC | PRN
  Administered 2024-05-12: 1000 mg via INTRAVENOUS

## 2024-05-12 MED ORDER — DROPERIDOL 2.5 MG/ML IJ SOLN: 0.6250 mg | Freq: Once | INTRAMUSCULAR | Status: DC | PRN

## 2024-05-12 MED ORDER — OXYCODONE HCL 5 MG PO TABS: 5.0000 mg | ORAL_TABLET | Freq: Once | ORAL | Status: DC | PRN

## 2024-05-12 MED ORDER — ROCURONIUM BROMIDE 10 MG/ML (PF) SYRINGE
PREFILLED_SYRINGE | INTRAVENOUS | Status: DC | PRN
  Administered 2024-05-12: 40 mg via INTRAVENOUS
  Administered 2024-05-12: 20 mg via INTRAVENOUS

## 2024-05-12 MED ORDER — BUPIVACAINE-EPINEPHRINE (PF) 0.25% -1:200000 IJ SOLN
INTRAMUSCULAR | Status: AC
  Filled 2024-05-12: qty 30

## 2024-05-12 MED ORDER — FENTANYL CITRATE (PF) 250 MCG/5ML IJ SOLN
INTRAMUSCULAR | Status: DC | PRN
  Administered 2024-05-12: 50 ug via INTRAVENOUS
  Administered 2024-05-12: 100 ug via INTRAVENOUS
  Administered 2024-05-12: 50 ug via INTRAVENOUS

## 2024-05-12 MED ORDER — SUGAMMADEX SODIUM 200 MG/2ML IV SOLN
INTRAVENOUS | Status: DC | PRN
  Administered 2024-05-12: 200 mg via INTRAVENOUS

## 2024-05-12 MED ORDER — FENTANYL CITRATE (PF) 100 MCG/2ML IJ SOLN
25.0000 ug | INTRAMUSCULAR | Status: DC | PRN
  Administered 2024-05-12: 25 ug via INTRAVENOUS

## 2024-05-12 MED ORDER — OXYCODONE HCL 5 MG/5ML PO SOLN: 5.0000 mg | Freq: Once | ORAL | Status: DC | PRN

## 2024-05-12 MED ORDER — INSULIN GLARGINE 100 UNIT/ML ~~LOC~~ SOLN
12.0000 [IU] | Freq: Every day | SUBCUTANEOUS | Status: DC
  Administered 2024-05-13: 12 [IU] via SUBCUTANEOUS
  Filled 2024-05-12: qty 0.12

## 2024-05-12 MED ORDER — LIDOCAINE 2% (20 MG/ML) 5 ML SYRINGE
INTRAMUSCULAR | Status: DC | PRN
  Administered 2024-05-12: 60 mg via INTRAVENOUS

## 2024-05-12 MED ORDER — HYDROMORPHONE HCL 1 MG/ML IJ SOLN
0.5000 mg | INTRAMUSCULAR | Status: DC | PRN
  Administered 2024-05-13: 0.5 mg via INTRAVENOUS
  Filled 2024-05-12: qty 0.5

## 2024-05-12 MED ORDER — ONDANSETRON HCL 4 MG/2ML IJ SOLN
INTRAMUSCULAR | Status: DC | PRN
  Administered 2024-05-12: 4 mg via INTRAVENOUS

## 2024-05-12 MED ORDER — PROPOFOL 10 MG/ML IV BOLUS
INTRAVENOUS | Status: AC
  Filled 2024-05-12: qty 20

## 2024-05-12 MED ORDER — PHENYLEPHRINE 80 MCG/ML (10ML) SYRINGE FOR IV PUSH (FOR BLOOD PRESSURE SUPPORT)
PREFILLED_SYRINGE | INTRAVENOUS | Status: DC | PRN
  Administered 2024-05-12: 120 ug via INTRAVENOUS
  Administered 2024-05-12: 80 ug via INTRAVENOUS

## 2024-05-12 MED ORDER — SODIUM CHLORIDE 0.9 % IR SOLN
Status: DC | PRN
  Administered 2024-05-12: 1000 mL

## 2024-05-12 MED ORDER — CHLORHEXIDINE GLUCONATE 0.12 % MT SOLN
15.0000 mL | Freq: Once | OROMUCOSAL | Status: AC
  Administered 2024-05-12: 15 mL via OROMUCOSAL

## 2024-05-12 MED ORDER — PROPOFOL 10 MG/ML IV BOLUS
INTRAVENOUS | Status: DC | PRN
  Administered 2024-05-12: 100 mg via INTRAVENOUS

## 2024-05-12 MED ORDER — INSULIN ASPART 100 UNIT/ML IJ SOLN: 0.0000 [IU] | INTRAMUSCULAR | Status: DC | PRN

## 2024-05-12 MED ORDER — LACTATED RINGERS IV SOLN: INTRAVENOUS | Status: DC

## 2024-05-12 MED ORDER — 0.9 % SODIUM CHLORIDE (POUR BTL) OPTIME
TOPICAL | Status: DC | PRN
  Administered 2024-05-12: 1000 mL

## 2024-05-12 MED ORDER — BUPIVACAINE-EPINEPHRINE 0.25% -1:200000 IJ SOLN
INTRAMUSCULAR | Status: DC | PRN
  Administered 2024-05-12: 30 mL

## 2024-05-12 MED ORDER — ACETAMINOPHEN 10 MG/ML IV SOLN
INTRAVENOUS | Status: AC
  Filled 2024-05-12: qty 100

## 2024-05-12 MED ORDER — EPHEDRINE SULFATE-NACL 50-0.9 MG/10ML-% IV SOSY
PREFILLED_SYRINGE | INTRAVENOUS | Status: DC | PRN
  Administered 2024-05-12 (×2): 5 mg via INTRAVENOUS

## 2024-05-12 MED ORDER — SODIUM CHLORIDE 0.9 % IV SOLN
INTRAVENOUS | Status: DC | PRN
  Administered 2024-05-12: 10 mL

## 2024-05-12 MED ORDER — ORAL CARE MOUTH RINSE: 15.0000 mL | Freq: Once | OROMUCOSAL | Status: AC

## 2024-05-12 MED ORDER — MIDAZOLAM HCL 2 MG/2ML IJ SOLN
INTRAMUSCULAR | Status: DC | PRN
  Administered 2024-05-12: 1 mg via INTRAVENOUS

## 2024-05-12 MED ORDER — MIDAZOLAM HCL 2 MG/2ML IJ SOLN
INTRAMUSCULAR | Status: AC
  Filled 2024-05-12: qty 2

## 2024-05-12 MED ORDER — CHLORHEXIDINE GLUCONATE 0.12 % MT SOLN
OROMUCOSAL | Status: AC
  Filled 2024-05-12: qty 15

## 2024-05-12 MED ORDER — FENTANYL CITRATE (PF) 100 MCG/2ML IJ SOLN
INTRAMUSCULAR | Status: AC
  Filled 2024-05-12: qty 2

## 2024-05-12 SURGICAL SUPPLY — 42 items
BAG COUNTER SPONGE SURGICOUNT (BAG) ×1 IMPLANT
CANISTER SUCTION 3000ML PPV (SUCTIONS) ×1 IMPLANT
CATH URETL OPEN 5X70 (CATHETERS) ×1 IMPLANT
CATH URETL OPEN END 6FR 70 (CATHETERS) IMPLANT
CHLORAPREP W/TINT 26 (MISCELLANEOUS) ×1 IMPLANT
CLIP APPLIE ROT 10 11.4 M/L (STAPLE) ×1 IMPLANT
COVER MAYO STAND STRL (DRAPES) IMPLANT
COVER SURGICAL LIGHT HANDLE (MISCELLANEOUS) ×1 IMPLANT
DERMABOND ADVANCED .7 DNX12 (GAUZE/BANDAGES/DRESSINGS) ×1 IMPLANT
DRAPE C-ARM 42X120 X-RAY (DRAPES) ×1 IMPLANT
ELECTRODE REM PT RTRN 9FT ADLT (ELECTROSURGICAL) ×1 IMPLANT
ENDOLOOP SUT PDS II 0 18 (SUTURE) IMPLANT
GLOVE BIO SURGEON STRL SZ7.5 (GLOVE) ×1 IMPLANT
GLOVE BIOGEL PI IND STRL 8 (GLOVE) ×1 IMPLANT
GOWN STRL REUS W/ TWL LRG LVL3 (GOWN DISPOSABLE) ×2 IMPLANT
GOWN STRL REUS W/ TWL XL LVL3 (GOWN DISPOSABLE) ×1 IMPLANT
GRASPER SUT TROCAR 14GX15 (MISCELLANEOUS) ×1 IMPLANT
IRRIGATION SUCT STRKRFLW 2 WTP (MISCELLANEOUS) ×1 IMPLANT
KIT BASIN OR (CUSTOM PROCEDURE TRAY) ×1 IMPLANT
KIT IMAGING PINPOINTPAQ (MISCELLANEOUS) IMPLANT
KIT TURNOVER KIT B (KITS) ×1 IMPLANT
NDL 22X1.5 STRL (OR ONLY) (MISCELLANEOUS) ×1 IMPLANT
NDL INSUFFLATION 14GA 120MM (NEEDLE) ×1 IMPLANT
NEEDLE 22X1.5 STRL (OR ONLY) (MISCELLANEOUS) ×1 IMPLANT
NEEDLE INSUFFLATION 14GA 120MM (NEEDLE) ×1 IMPLANT
NS IRRIG 1000ML POUR BTL (IV SOLUTION) ×1 IMPLANT
PAD ARMBOARD POSITIONER FOAM (MISCELLANEOUS) ×1 IMPLANT
POUCH RETRIEVAL ECOSAC 10 (ENDOMECHANICALS) ×1 IMPLANT
SCISSORS LAP 5X35 DISP (ENDOMECHANICALS) ×1 IMPLANT
SET CHOLANGIOGRAPH 5 50 .035 (SET/KITS/TRAYS/PACK) IMPLANT
SET TUBE SMOKE EVAC HIGH FLOW (TUBING) ×1 IMPLANT
SLEEVE Z-THREAD 5X100MM (TROCAR) ×2 IMPLANT
SPECIMEN JAR SMALL (MISCELLANEOUS) ×1 IMPLANT
STOPCOCK 4 WAY LG BORE MALE ST (IV SETS) ×1 IMPLANT
SUT MNCRL AB 4-0 PS2 18 (SUTURE) ×1 IMPLANT
TOWEL GREEN STERILE (TOWEL DISPOSABLE) ×1 IMPLANT
TOWEL GREEN STERILE FF (TOWEL DISPOSABLE) ×1 IMPLANT
TRAY LAPAROSCOPIC MC (CUSTOM PROCEDURE TRAY) ×1 IMPLANT
TROCAR 11X100 Z THREAD (TROCAR) ×1 IMPLANT
TROCAR Z-THREAD OPTICAL 5X100M (TROCAR) ×1 IMPLANT
WARMER LAPAROSCOPE (MISCELLANEOUS) ×1 IMPLANT
WATER STERILE IRR 1000ML POUR (IV SOLUTION) ×1 IMPLANT

## 2024-05-12 NOTE — Op Note (Signed)
 Patient: Elizabeth Saunders (Apr 12, 1956, [id number])  Date of Surgery: 05/12/2024  Preoperative Diagnosis: Cholecystitis, choledocholithiasis  Postoperative Diagnosis: Cholecystitis   Surgical Procedure: LAPAROSCOPIC CHOLECYSTECTOMY WITH INTRAOPERATIVE CHOLANGIOGRAM: 52436 (CPT)    Operative Team Members:  Surgeons and Role:    * Kenzleigh Sedam, Deward PARAS, MD - Primary   Anesthesiologist: Erma Thom SAUNDERS, MD CRNA: Arvell Edsel HERO, CRNA; Virgil Ee, CRNA   Anesthesia: General   Fluids:  No intake/output data recorded.  Complications: None  Drains:  none   Specimen:  ID Type Source Tests Collected by Time Destination  1 : Gallbladder Tissue PATH Gallbladder SURGICAL PATHOLOGY Kenyetta Fife, Deward PARAS, MD 05/12/2024 1210      Disposition:  PACU - hemodynamically stable.  Plan of Care: Admit for overnight observation    Indications for Procedure: Victorine Leitzke is a 68 y.o. female who presented with abdominal pain.  History, physical and imaging was concerning for cholecystitis and choledocholithiasis.  Laparoscopic cholecystectomy was recommended for the patient.  The procedure itself, as well as the risks, benefits and alternatives were discussed with the patient.  Risks discussed included but were not limited to the risk of infection, bleeding, damage to nearby structures, need to convert to open procedure, incisional hernia, bile leak, common bile duct injury and the need for additional procedures or surgeries.  With this discussion complete and all questions answered the patient granted consent to proceed.  Findings: Inflamed gallbladder, gallstones, negative IOC,   Infection status: Patient: Elizabeth Saunders Emergency General Surgery Service Patient Case: Urgent Infection Present At Time Of Surgery (PATOS): Inflamed gallbladder   Description of Procedure:   On the date stated above, the patient was taken to the operating room suite and placed in supine positioning.  Sequential compression  devices were placed on the lower extremities to prevent blood clots.  General endotracheal anesthesia was induced. Preoperative antibiotics were given.  The patient's abdomen was prepped and draped in the usual sterile fashion.  A time-out was completed verifying the correct patient, procedure, positioning and equipment needed for the case.  I inflated the abdomen using a Veress needle at palmers point.  I then placed two 5mm trocar in the right upper and lower quadrant.  There were dense adhesions in the mid to lower abdomen.  I cleared some using laparoscopic scissors.  I cleared just enough to place trocars to take the gallbladder out. The adhesions covered the the lower abdomen in the area of the previous scars.  I placed one 5 mm trocar at the superior aspect of the previous midline laparotomy incision, one 12mm  trocar subxiphoid, and one 5 mm trocar subcostal midclavicular line.  These were placed under direct vision without any trauma to the underlying viscera.    The patient was then placed in head up, left side down positioning.  The gallbladder was identified and dissected free from its attachments to the omentum allowing the duodenum to fall away.  The infundibulum of the gallbladder was dissected free working laterally to medially.  The cystic duct and cystic artery were dissected free from surrounding connective tissue.  The infundibulum of the gallbladder was dissected off the cystic plate.  A critical view of safety was obtained with the cystic duct and cystic artery being cleared of connective tissues and clearly the only two structures entering into the gallbladder with the liver clearly visible behind.  One clip was applied high on the cystic duct.  A small ductotomy was created below this using the endoscopic shears.  A  cholangiogram catheter was introduced through the abdominal wall and into the cystic duct through this ductotomy.  The catheter was clipped into position.  The catheter was  flushed to ensure no leakage around the clip.  We then removed the laparoscopic instruments and positioned the C-Arm to perform a cholangiogram.  The catheter was flushed with contrast under fluoroscopic visualization and a cholangiogram was obtained.  The cholangiogram visualized the biliary tree from the ampulla up to the first two biliary radicals in the liver.  There were no filling defects identified.  The catheter clearly entered the cystic duct.  There was gradual tapering of the common bile duct down to the ampulla without evidence of stricture or other abnormalities.  Please see the EMR for saved representative images.  With our cholangiogram compete, we moved the c-arm away from the field and returned to laparoscopic surgery.    Clips were then applied to the cystic duct and cystic artery and then these structures were divided.  A PDS endoloop was placed to secure the cystic duct.  The gallbladder was dissected off the cystic plate, placed in an endocatch bag and removed from the 12 mm subxiphoid port site.  The clips were inspected and appeared effective.  The cystic plate was inspected and hemostasis was obtained using electrocautery.  A suction irrigator was used to clean the operative field.  Attention was turned to closure.  The 12 mm subxiphoid port site was closed using a 0-vicryl suture on a fascial suture passer.  The abdomen was desufflated.  The skin was closed using 4-0 monocryl and dermabond.  All sponge and needle counts were correct at the conclusion of the case.    Deward Foy, MD General, Bariatric, & Minimally Invasive Surgery [facility], [place]

## 2024-05-12 NOTE — Anesthesia Procedure Notes (Signed)
 Procedure Name: Intubation Date/Time: 05/12/2024 11:12 AM  Performed by: Virgil Ee, CRNAPre-anesthesia Checklist: Patient identified, Patient being monitored, Timeout performed, Emergency Drugs available and Suction available Patient Re-evaluated:Patient Re-evaluated prior to induction Oxygen Delivery Method: Circle system utilized Preoxygenation: Pre-oxygenation with 100% oxygen Induction Type: IV induction Ventilation: Mask ventilation without difficulty Laryngoscope Size: Mac and 4 Grade View: Grade I Tube type: Oral Tube size: 6.5 mm Number of attempts: 1 Airway Equipment and Method: Stylet Placement Confirmation: ETT inserted through vocal cords under direct vision, positive ETCO2 and breath sounds checked- equal and bilateral Secured at: 20 cm Tube secured with: Tape Dental Injury: Teeth and Oropharynx as per pre-operative assessment

## 2024-05-12 NOTE — Progress Notes (Signed)
 Progress Note: General Surgery Service   Chief Complaint/Subjective: In preop  Objective: Vital signs in last 24 hours: Temp:  [98 F (36.7 C)-98.5 F (36.9 C)] 98.4 F (36.9 C) (09/14 0847) Pulse Rate:  [54-74] 60 (09/14 0847) Resp:  [15-20] 16 (09/14 0847) BP: (104-136)/(62-82) 136/62 (09/14 0847) SpO2:  [97 %-100 %] 100 % (09/14 0847) Last BM Date : 05/11/24  Intake/Output from previous day: No intake/output data recorded. Intake/Output this shift: No intake/output data recorded.  Constitutional: NAD; conversant; no deformities Eyes: Moist conjunctiva; no lid lag; anicteric; PERRL Neck: Trachea midline; no thyromegaly Lungs: Normal respiratory effort; no tactile fremitus CV: RRR; no palpable thrills; no pitting edema GI: Abd soft, tender RUQ; no palpable hepatosplenomegaly MSK: Normal range of motion of extremities; no clubbing/cyanosis Psychiatric: Appropriate affect; alert and oriented x3 Lymphatic: No palpable cervical or axillary lymphadenopathy  Lab Results: CBC  Recent Labs    05/11/24 0346 05/12/24 0527  WBC 9.1 6.7  HGB 12.5 11.7*  HCT 41.6 37.8  PLT 243 227   BMET Recent Labs    05/11/24 1813 05/12/24 0527  NA 136 136  K 4.1 3.7  CL 100 105  CO2 23 22  GLUCOSE 212* 256*  BUN 8 13  CREATININE 0.57 0.64  CALCIUM  8.8* 8.5*   PT/INR Recent Labs    05/12/24 0527  LABPROT 14.3  INR 1.1   ABG No results for input(s): PHART, HCO3 in the last 72 hours.  Invalid input(s): PCO2, PO2  Anti-infectives: Anti-infectives (From admission, onward)    Start     Dose/Rate Route Frequency Ordered Stop   05/11/24 1400  [MAR Hold]  ampicillin -sulbactam (UNASYN ) 1.5 g in sodium chloride  0.9 % 100 mL IVPB        (MAR Hold since Sun 05/12/2024 at 0908.Hold Reason: Transfer to a Procedural area)   1.5 g 200 mL/hr over 30 Minutes Intravenous Every 6 hours 05/11/24 1236         Medications: Scheduled Meds:  chlorhexidine   15 mL Mouth/Throat Once    Or   mouth rinse  15 mL Mouth Rinse Once   [MAR Hold] insulin  aspart  0-15 Units Subcutaneous TID WC   [MAR Hold] insulin  glargine  10 Units Subcutaneous Daily   Continuous Infusions:  [MAR Hold] ampicillin -sulbactam (UNASYN ) IV 1.5 g (05/12/24 0853)   lactated ringers      PRN Meds:.[MAR Hold] ibuprofen , insulin  aspart, [MAR Hold] ondansetron  (ZOFRAN ) IV, [MAR Hold] oxyCODONE   Assessment/Plan: Elizabeth Saunders is an 68 y.o. female with cholecystitis and choledocholithiasis.  GI was consulted and does not have ERCP resources available till later in the week.  I recommend proceeding with laparoscopic, possibly open, cholecystectomy with intra-operative cholangiogram.  We discussed the procedure, its risks, benefits and alternatives and the patient granted consent to proceed.  Risks discussed included but were not limited to the risk of infection, bleeding, damage to nearby structures, bowel injury in the setting of previous abdominal adhesions, bile leak.  Montagnard Rhade phone interpretation offered, but family declined and son interpreted.     LOS: 1 day   Deward JINNY Foy, MD  [facility] Use [email or web address] to contact on call provider

## 2024-05-12 NOTE — Transfer of Care (Signed)
 Immediate Anesthesia Transfer of Care Note  Patient: Elizabeth Saunders  Procedure(s) Performed: LAPAROSCOPIC CHOLECYSTECTOMY WITH INTRAOPERATIVE CHOLANGIOGRAM  Patient Location: PACU  Anesthesia Type:General  Level of Consciousness: drowsy and responds to stimulation  Airway & Oxygen Therapy: Patient Spontanous Breathing and Patient connected to face mask oxygen  Post-op Assessment: Report given to RN and Post -op Vital signs reviewed and stable  Post vital signs: Reviewed and stable  Last Vitals:  Vitals Value Taken Time  BP 141/67 05/12/24 12:45  Temp    Pulse 60 05/12/24 12:49  Resp 17 05/12/24 12:49  SpO2 100 % 05/12/24 12:49  Vitals shown include unfiled device data.  Last Pain:  Vitals:   05/12/24 0847  TempSrc: Oral  PainSc:          Complications: No notable events documented.

## 2024-05-12 NOTE — Progress Notes (Signed)
 HD#1 SUBJECTIVE:  Patient Summary: Elizabeth Saunders is a 68 y.o. 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   Overnight Events: NAE  Interim History: The patient was seen and examined at the bedside this morning.She was feeling better overall and preparing for surgery this AM.   OBJECTIVE:  Vital Signs: Vitals:   05/11/24 1944 05/12/24 0413 05/12/24 0847 05/12/24 0921  BP: 106/65 104/63 136/62   Pulse: 67 (!) 54 60   Resp: 17 15 16    Temp: 98 F (36.7 C) 98.2 F (36.8 C) 98.4 F (36.9 C)   TempSrc: Oral Oral Oral   SpO2: 97% 100% 100%   Height:    5' 5 (1.651 m)   Supplemental O2: Room Air SpO2: 100 %  Filed Weights    No intake or output data in the 24 hours ending 05/12/24 0931 Net IO Since Admission: No IO data has been entered for this period [05/12/24 0931]  Physical Exam: Const: Awake, alert in NAD HENT: Normocephalic, atraumatic, mucus membranes moist Card: RRR, No MRG, No pitting edema on LE's bilaterally  Resp: LCTAB, no increased work of breathing Abd: Soft, NTND Extremities: Warm, pink  Patient Lines/Drains/Airways Status     Active Line/Drains/Airways     Name Placement date Placement time Site Days   Peripheral IV 05/11/24 20 G Left Antecubital 05/11/24  0740  Antecubital  1   Ileostomy Loop RUQ 10/05/18  1440  RUQ  2046   Incision - 4 Ports Abdomen Left;Upper Right;Upper Right;Medial Right;Lower 10/05/18  0811  -- 2046            Pertinent labs and imaging:      Latest Ref Rng & Units 05/12/2024    5:27 AM 05/11/2024    3:46 AM 05/11/2024    3:41 AM  CBC  WBC 4.0 - 10.5 K/uL 6.7  9.1    Hemoglobin 12.0 - 15.0 g/dL 88.2  87.4  85.6   Hematocrit 36.0 - 46.0 % 37.8  41.6  42.0   Platelets 150 - 400 K/uL 227  243         Latest Ref Rng & Units 05/12/2024    5:27 AM 05/11/2024    6:13 PM 05/11/2024    3:46 AM  CMP  Glucose 70 - 99 mg/dL  743  787  676   BUN 8 - 23 mg/dL 13  8  6    Creatinine 0.44 - 1.00 mg/dL 9.35  9.42  9.38   Sodium 135 - 145 mmol/L 136  136  135   Potassium 3.5 - 5.1 mmol/L 3.7  4.1  4.3   Chloride 98 - 111 mmol/L 105  100  99   CO2 22 - 32 mmol/L 22  23  20    Calcium  8.9 - 10.3 mg/dL 8.5  8.8  8.8   Total Protein 6.5 - 8.1 g/dL 6.4   7.3   Total Bilirubin 0.0 - 1.2 mg/dL 5.2   3.2   Alkaline Phos 38 - 126 U/L 198   153   AST 15 - 41 U/L 293   318   ALT 0 - 44 U/L 297   121     MR ABDOMEN MRCP W WO CONTAST Result Date: 05/11/2024 EXAM: MRCP WITH AND WITHOUT IV CONTRAST 05/11/2024 10:13:00 AM TECHNIQUE: Multisequence, multiplanar magnetic resonance images of the abdomen with and without intravenous contrast. MRCP sequences were performed. COMPARISON: None available.  CLINICAL HISTORY: RUQ abdominal pain, biliary disease suspected, US  nondiagnostic. FINDINGS: LIVER: There is diffuse hepatic steatosis. No focal liver lesion. GALLBLADDER AND BILIARY SYSTEM: Gallbladder appears distended. There is mild pericholecystic fluid. Numerous tiny stones are identified within the gallbladder lumen measuring up to 3 mm. The common bile duct measures 8 mm, image 12/3. There are several small stones in the distal common bile duct which measure up to 3 mm, image 13/3. SPLEEN: Normal appearance. PANCREAS/PANCREATIC DUCT: Normal appearance of the pancreas without signs of main duct dilatation, inflammation or mass. ADRENAL GLANDS: Normal. KIDNEYS: Small, bilateral, less than 1 cm kidney cysts. No signs of obstructive uropathy. LYMPH NODES: No enlarged abdominal lymph nodes. VASULATURE: Unremarkable. PERITONEUM: No ascites. ABDOMINAL WALL: No hernia. No mass. BOWEL: Grossly unremarkable. No bowel obstruction. BONES: No acute abnormality or worrisome osseous lesion. SOFT TISSUES: Unremarkable. MISCELLANEOUS: No follow up imaging recommended. IMPRESSION: 1. Numerous tiny stones in the gallbladder lumen and several small stones in the  distal common bile duct. 2. Gallbladder distention and mild pericholecystic fluid. 3. Diffuse hepatic steatosis. No focal liver lesion. Electronically signed by: Waddell Calk MD 05/11/2024 10:42 AM EDT RP Workstation: [id number]    ASSESSMENT/PLAN:  Assessment: Principal Problem:   Choledocholithiasis with acute cholecystitis Active Problems:   Microcytic anemia   Type 2 diabetes mellitus with other specified complication (HCC)   Metabolic acidosis   Plan: #Choledocholithiasis with acute cholecystitis  -Abdominal pain is improving today -LFT increased today with total bili 5.2, AST 293, ALT 297, and ALP 198. Likely 2/2 to CBD obstruction -Cholecystectomy planned this morning with general surgery.  -ERCP early next week.  -Continue zofran  and pain medication as needed.  -Continue unasyn  through 24 hours post-op  #Type 2 Diabetes -pt with no known hx of diabetes, however presented with glucose 323 and ketones and glucose in the urine -She does endorse polydipsia and polyuria -A1C 10.7 -Initiated 12 units of glargine with SSI -Will need medication initiation upon discharge.   #AGMA -Resolved with insulin  administration -Continue to monitor  Best Practice: Diet: NPO IVF: Fluids: none, Rate: None VTE: SCDs Start: 05/11/24 1324 Code: Full  Disposition planning: Therapy Recs: None, DME: none DISPO: Anticipated discharge tomorrow to Home pending clinical improvement.  Signature:  Schuyler [facility]  9:31 AM, 05/12/2024  On Call pager [phone]

## 2024-05-12 NOTE — Anesthesia Postprocedure Evaluation (Signed)
 Anesthesia Post Note  Patient: Elizabeth Saunders  Procedure(s) Performed: LAPAROSCOPIC CHOLECYSTECTOMY WITH INTRAOPERATIVE CHOLANGIOGRAM     Patient location during evaluation: PACU Anesthesia Type: General Level of consciousness: awake and alert Pain management: pain level controlled Vital Signs Assessment: post-procedure vital signs reviewed and stable Respiratory status: spontaneous breathing, nonlabored ventilation, respiratory function stable and patient connected to nasal cannula oxygen Cardiovascular status: blood pressure returned to baseline and stable Postop Assessment: no apparent nausea or vomiting Anesthetic complications: no   No notable events documented.  Last Vitals:  Vitals:   05/12/24 1417 05/12/24 1555  BP: 130/69 125/64  Pulse: (!) 58 (!) 57  Resp: 18 15  Temp: 36.7 C 37 C  SpO2: 98% 97%    Last Pain:  Vitals:   05/12/24 1749  TempSrc:   PainSc: 8                  Thom JONELLE Peoples

## 2024-05-12 NOTE — Anesthesia Preprocedure Evaluation (Addendum)
 Anesthesia Evaluation  Patient identified by MRN, date of birth, ID band Patient awake    Reviewed: Allergy & Precautions, H&P , NPO status , Patient's Chart, lab work & pertinent test results  History of Anesthesia Complications Negative for: history of anesthetic complications  Airway Mallampati: II  TM Distance: >3 FB Neck ROM: Full    Dental no notable dental hx.    Pulmonary neg pulmonary ROS, neg sleep apnea, neg COPD   Pulmonary exam normal breath sounds clear to auscultation       Cardiovascular (-) angina (-) Past MI negative cardio ROS Normal cardiovascular exam Rhythm:Regular Rate:Normal     Neuro/Psych neg Seizures negative neurological ROS  negative psych ROS   GI/Hepatic Neg liver ROS,,,cholecystitis  Hx of colorectal cancer   Endo/Other  diabetes, Type 2    Renal/GU negative Renal ROS  negative genitourinary   Musculoskeletal negative musculoskeletal ROS (+)    Abdominal   Peds negative pediatric ROS (+)  Hematology  (+) Blood dyscrasia, anemia   Anesthesia Other Findings   Reproductive/Obstetrics negative OB ROS                              Anesthesia Physical Anesthesia Plan  ASA: 3  Anesthesia Plan: General   Post-op Pain Management:    Induction: Intravenous  PONV Risk Score and Plan: 3 and Ondansetron , Dexamethasone  and Treatment may vary due to age or medical condition  Airway Management Planned: Oral ETT  Additional Equipment: None  Intra-op Plan:   Post-operative Plan: Extubation in OR  Informed Consent: I have reviewed the patients History and Physical, chart, labs and discussed the procedure including the risks, benefits and alternatives for the proposed anesthesia with the patient or authorized representative who has indicated his/her understanding and acceptance.     Dental advisory given  Plan Discussed with: CRNA  Anesthesia Plan  Comments:          Anesthesia Quick Evaluation

## 2024-05-12 NOTE — Plan of Care (Signed)

## 2024-05-12 NOTE — Hospital Course (Addendum)
 Principal Problem:   Choledocholithiasis with acute cholecystitis Active Problems:   Microcytic anemia   Type 2 diabetes mellitus with other specified complication (HCC)   Metabolic acidosis  Resolved Problems:   * No resolved hospital problems. *  Consults:***  Procedures:***  Follow-up items:***

## 2024-05-13 ENCOUNTER — Encounter: Payer: Self-pay | Admitting: Oncology

## 2024-05-13 ENCOUNTER — Other Ambulatory Visit (HOSPITAL_COMMUNITY): Payer: Self-pay

## 2024-05-13 ENCOUNTER — Encounter (HOSPITAL_COMMUNITY): Payer: Self-pay | Admitting: Surgery

## 2024-05-13 LAB — COMPREHENSIVE METABOLIC PANEL WITH GFR
ALT: 216 U/L — ABNORMAL HIGH (ref 0–44)
AST: 134 U/L — ABNORMAL HIGH (ref 15–41)
Albumin: 2.8 g/dL — ABNORMAL LOW (ref 3.5–5.0)
Alkaline Phosphatase: 179 U/L — ABNORMAL HIGH (ref 38–126)
Anion gap: 11 (ref 5–15)
BUN: 10 mg/dL (ref 8–23)
CO2: 24 mmol/L (ref 22–32)
Calcium: 8.2 mg/dL — ABNORMAL LOW (ref 8.9–10.3)
Chloride: 105 mmol/L (ref 98–111)
Creatinine, Ser: 0.52 mg/dL (ref 0.44–1.00)
GFR, Estimated: >60 mL/min (ref >60)
Glucose, Bld: 85 mg/dL (ref 70–99)
Potassium: 3.3 mmol/L — ABNORMAL LOW (ref 3.5–5.1)
Sodium: 140 mmol/L (ref 135–145)
Total Bilirubin: 2.9 mg/dL — ABNORMAL HIGH (ref 0.0–1.2)
Total Protein: 5.7 g/dL — ABNORMAL LOW (ref 6.5–8.1)

## 2024-05-13 LAB — CBC
HCT: 36.7 % (ref 36.0–46.0)
Hemoglobin: 11 g/dL — ABNORMAL LOW (ref 12.0–15.0)
MCH: 21.9 pg — ABNORMAL LOW (ref 26.0–34.0)
MCHC: 30 g/dL (ref 30.0–36.0)
MCV: 73.1 fL — ABNORMAL LOW (ref 80.0–100.0)
Platelets: 218 K/uL (ref 150–400)
RBC: 5.02 MIL/uL (ref 3.87–5.11)
RDW: 14.6 % (ref 11.5–15.5)
WBC: 7.7 K/uL (ref 4.0–10.5)
nRBC: 0 % (ref 0.0–0.2)

## 2024-05-13 LAB — GLUCOSE, CAPILLARY
Glucose-Capillary: 102 mg/dL — ABNORMAL HIGH (ref 70–99)
Glucose-Capillary: 286 mg/dL — ABNORMAL HIGH (ref 70–99)
Glucose-Capillary: 242 mg/dL — ABNORMAL HIGH (ref 70–99)

## 2024-05-13 MED ORDER — POTASSIUM CHLORIDE CRYS ER 20 MEQ PO TBCR
40.0000 meq | EXTENDED_RELEASE_TABLET | Freq: Once | ORAL | Status: AC
Start: 2024-05-13 — End: 2024-05-13
  Administered 2024-05-13: 40 meq via ORAL
  Filled 2024-05-13: qty 2

## 2024-05-13 MED ORDER — INSULIN STARTER KIT- PEN NEEDLES (ENGLISH)
1.0000 | Freq: Once | Status: AC
  Administered 2024-05-13: 1
  Filled 2024-05-13: qty 1

## 2024-05-13 MED ORDER — INSULIN GLARGINE 100 UNIT/ML SOLOSTAR PEN
10.0000 [IU] | PEN_INJECTOR | Freq: Every day | SUBCUTANEOUS | 0 refills | Status: AC
  Filled 2024-05-13: qty 15, 140d supply, fill #0

## 2024-05-13 MED ORDER — BLOOD GLUCOSE TEST VI STRP
1.0000 | ORAL_STRIP | Freq: Three times a day (TID) | 0 refills | Status: AC
  Filled 2024-05-13: qty 100, 34d supply, fill #0

## 2024-05-13 MED ORDER — LIVING WELL WITH DIABETES BOOK
Freq: Once | Status: AC
  Filled 2024-05-13: qty 1

## 2024-05-13 MED ORDER — ONDANSETRON HCL 4 MG PO TABS
4.0000 mg | ORAL_TABLET | Freq: Three times a day (TID) | ORAL | 0 refills | Status: AC | PRN
Start: 2024-05-13 — End: 2024-05-18
  Filled 2024-05-13: qty 15, 5d supply, fill #0

## 2024-05-13 MED ORDER — LANCET DEVICE MISC
1.0000 | Freq: Three times a day (TID) | 0 refills | Status: AC
  Filled 2024-05-13: qty 1, fill #0

## 2024-05-13 MED ORDER — INSULIN PEN NEEDLE 32G X 4 MM MISC
1.0000 | Freq: Every day | 0 refills | Status: AC
  Filled 2024-05-13: qty 100, fill #0
  Filled 2024-05-13: qty 100, 100d supply, fill #0

## 2024-05-13 MED ORDER — BLOOD GLUCOSE MONITOR SYSTEM W/DEVICE KIT
1.0000 | PACK | Freq: Three times a day (TID) | 0 refills | Status: AC
  Filled 2024-05-13: qty 1, 30d supply, fill #0

## 2024-05-13 MED ORDER — INSULIN PEN NEEDLE 32G X 4 MM MISC
1.0000 | Freq: Once | 0 refills | Status: AC
  Filled 2024-05-13: qty 100, 100d supply, fill #0

## 2024-05-13 MED ORDER — IBUPROFEN 400 MG PO TABS
600.0000 mg | ORAL_TABLET | Freq: Four times a day (QID) | ORAL | 0 refills | Status: AC | PRN
  Filled 2024-05-13: qty 45, 10d supply, fill #0

## 2024-05-13 MED ORDER — ACCU-CHEK SOFTCLIX LANCETS MISC
1.0000 | Freq: Three times a day (TID) | 0 refills | Status: AC
  Filled 2024-05-13: qty 100, 30d supply, fill #0

## 2024-05-13 MED ORDER — OXYCODONE HCL 5 MG PO TABS
5.0000 mg | ORAL_TABLET | Freq: Four times a day (QID) | ORAL | 0 refills | Status: AC | PRN
  Filled 2024-05-13: qty 20, 5d supply, fill #0

## 2024-05-13 NOTE — Inpatient Diabetes Management (Addendum)
 Inpatient Diabetes Program Recommendations  AACE/ADA: New Consensus Statement on Inpatient Glycemic Control   Target Ranges:  Prepandial:   less than 140 mg/dL      Peak postprandial:   less than 180 mg/dL (1-2 hours)      Critically ill patients:  140 - 180 mg/dL    Latest Reference Range & Units 05/11/24 18:14  Hemoglobin A1C 4.8 - 5.6 % 10.7 (H)    Latest Reference Range & Units 05/12/24 08:44 05/12/24 12:46 05/12/24 15:51 05/12/24 21:06 05/13/24 06:01  Glucose-Capillary 70 - 99 mg/dL 758 (H) 852 (H) 849 (H) 131 (H) 102 (H)   Review of Glycemic Control  Diabetes history: none Outpatient Diabetes medications: none Current orders for Inpatient glycemic control: Lantus  12 units daily   Inpatient Diabetes Program Recommendations: Pt newly diagnosed with DM2. Met with pt and pt's family at bedside - used pt's son at bedside for interpreter per patient request. Spoke with patient about new diabetes diagnosis. Discussed A1C results of 10.7% and explained what an A1C is and informed patient that his current A1C indicates an average glucose of 260mg /dl over the past 2-3 months.  Discussed basic pathophysiology of DM Type 2, basic home care, importance of checking CBGs and maintaining good CBG control to prevent long-term and short-term complications. Reviewed glucose and A1C goals. Reviewed signs and symptoms of hyperglycemia and hypoglycemia along with treatment for both. Discussed impact of nutrition, exercise, stress, sickness, and medications on diabetes control. Informed patient that she will be prescribed Lantus  at discharge.  since it is more affordable. Discussed long-acting insulin  in detail (how to take it, when to take it). Asked patient to check her glucose atleast 4 times per day (before meals and at bedtime) and to keep a log book of glucose readings and insulin  taken. Explained how the doctor she follows up with can use the log book to continue to make insulin  adjustments if needed.  Reviewed and demonstrated how to draw up and administer insulin  with vial and syringe. Patient was able to successfully demonstrate how to dial up and administer insulin  with KwikPen. Teach back was demonstrated using insulin  Pen. Living Well with Diabetes Book ordered for patient - Patient's family states they are able to read book in [place] and explain to patient. Patient and patient's family verbalized understanding of information discussed and has no further questions at this time related to diabetes.   Discharge Recommendations: Long acting recommendations: Insulin  Glargine (LANTUS ) Solostar Pen 10 units daily  Hypoglycemia treatment recommendations: Baqsimi 3mg  Supply/Referral recommendations: Glucometer Test strips Lancet device Lancets Pen needles - standard  Use Adult Diabetes Insulin  Treatment Post Discharge order set.   Thanks,  Lavanda Search, RN, MSN, [facility]  Inpatient Diabetes Coordinator  Pager [phone] (8a-5p)

## 2024-05-13 NOTE — Discharge Instructions (Addendum)
 Thank you for allowing us  to be part of your care. You were hospitalized for infection of the gallbladder. We treated you with antibiotics and surgery.  See the changes in your medications and management of your chronic conditions below:  *For your pain -We have STARTED you on these following medications:  - Oxycodone  5 mg--take up to 1 tablet every 6 hours as needed for significant pain  - Ibuprofen  600 mg--take up to 600 mg of ibuprofen  as needed for mild to moderate pain every 6 hours  *For your diabetes -We have STARTED you on these following medications:  - Insulin  (Lantus )--inject 10 units into the skin at the same time every day.  - It is important that you take this every day.  - Please check your blood sugar each morning, write these down, and bring them with you to your follow-up appointment.  - Please call your doctor if your sugar is staying below 70 or you are feeling shaky, related, or like you are going to pass out.  -Make sure to maintain a good diet with decreased carbs and sugars.  -Please see your PCP in 7 to 10 days  FOLLOW UP APPOINTMENTS: We arranged for you to follow up with your family doctor at: [facility] with Dr. Myrna on 9/25 at 8:45 AM Please visit your surgeon on 10/14 at 9 AM  Please make sure to keep your incision sites clean and call your doctor if they become red, more painful, or begin draining  Please call your PCP or our clinic if you have any questions or concerns, we may be able to help and keep you from a long and expensive emergency room wait. Our clinic and after hours phone number is [phone]. The best time to call is Monday through Friday 9 am to 4 pm but there is always someone available 24/7 if you have an emergency. If you need medication refills please notify your pharmacy one week in advance and they will send us  a request.   We are glad you are feeling better,  Schuyler Myrna Internal Medicine Inpatient Teaching Service  at [facility]    LAPAROSCOPIC SURGERY: POST OP INSTRUCTIONS Always review your discharge instruction sheet given to you by the facility where your surgery was performed. IF YOU HAVE DISABILITY OR FAMILY LEAVE FORMS, YOU MUST BRING THEM TO THE OFFICE FOR PROCESSING.   DO NOT GIVE THEM TO YOUR DOCTOR.  PAIN CONTROL  First take acetaminophen  (Tylenol ) AND/or ibuprofen  (Advil ) to control your pain after surgery.  Follow directions on package.  Taking acetaminophen  (Tylenol ) and/or ibuprofen  (Advil ) regularly after surgery will help to control your pain and lower the amount of prescription pain medication you may need.  You should not take more than 3,000 mg (3 grams) of acetaminophen  (Tylenol ) in 24 hours.  You should not take ibuprofen  (Advil ), aleve, motrin , naprosyn or other NSAIDS if you have a history of stomach ulcers or chronic kidney disease.  A prescription for pain medication may be given to you upon discharge.  Take your pain medication as prescribed, if you still have uncontrolled pain after taking acetaminophen  (Tylenol ) or ibuprofen  (Advil ). Use ice packs to help control pain. If you need a refill on your pain medication, please contact your pharmacy.  They will contact our office to request authorization. Prescriptions will not be filled after 5pm or on week-ends.  HOME MEDICATIONS Take your usually prescribed medications unless otherwise directed.  DIET You should follow a light diet  the first few days after arrival home.  Be sure to include lots of fluids daily. Avoid fatty, fried foods.   CONSTIPATION It is common to experience some constipation after surgery and if you are taking pain medication.  Increasing fluid intake and taking a stool softener (such as Colace) will usually help or prevent this problem from occurring.  A mild laxative (Milk of Magnesia or Miralax ) should be taken according to package instructions if there are no bowel movements after 48  hours.  WOUND/INCISION CARE Most patients will experience some swelling and bruising in the area of the incisions.  Ice packs will help.  Swelling and bruising can take several days to resolve.  Unless discharge instructions indicate otherwise, follow guidelines below  STERI-STRIPS - you may remove your outer bandages 48 hours after surgery, and you may shower at that time.  You have steri-strips (small skin tapes) in place directly over the incision.  These strips should be left on the skin for 7-10 days.   DERMABOND/SKIN GLUE - you may shower in 24 hours.  The glue will flake off over the next 2-3 weeks. Any sutures or staples will be removed at the office during your follow-up visit.  ACTIVITIES You may resume regular (light) daily activities beginning the next day--such as daily self-care, walking, climbing stairs--gradually increasing activities as tolerated.  You may have sexual intercourse when it is comfortable.  Refrain from any heavy lifting or straining until approved by your doctor. You may drive when you are no longer taking prescription pain medication, you can comfortably wear a seatbelt, and you can safely maneuver your car and apply brakes.  FOLLOW-UP You should see your doctor in the office for a follow-up appointment approximately 2-3 weeks after your surgery.  You should have been given your post-op/follow-up appointment when your surgery was scheduled.  If you did not receive a post-op/follow-up appointment, make sure that you call for this appointment within a day or two after you arrive home to insure a convenient appointment time.  OTHER INSTRUCTIONS  WHEN TO CALL YOUR DOCTOR: Fever over 101.0 Inability to urinate Continued bleeding from incision. Increased pain, redness, or drainage from the incision. Increasing abdominal pain  The clinic staff is available to answer your questions during regular business hours.  Please don't hesitate to call and ask to speak to one  of the nurses for clinical concerns.  If you have a medical emergency, go to the nearest emergency room or call 911.  A surgeon from [facility] is always on call at the hospital. [address], [place], [place]  [address] ? [address] [address], [place], [place]   [address] [phone] ? [phone] ? FAX [phone] Web site: [email or web address]

## 2024-05-13 NOTE — Plan of Care (Signed)

## 2024-05-13 NOTE — Discharge Summary (Signed)
 Name: Elizabeth Saunders MRN: [id number] DOB: 1956-02-28 68 y.o. PCP: Elizabeth Saunders, Saad, MD  Date of Admission: 05/11/2024  3:26 AM Date of Discharge: 05/13/2023 Attending Physician: Dr. Reyes Saunders  Discharge Diagnosis: 1. Principal Problem:   Choledocholithiasis with acute cholecystitis Active Problems:   Microcytic anemia   Type 2 diabetes mellitus with other specified complication (HCC)   Metabolic acidosis   Discharge Medications: Allergies as of 05/13/2024   No Active Allergies      Medication List     STOP taking these medications    ferrous sulfate  325 (65 FE) MG EC tablet       TAKE these medications    acetaminophen  325 MG tablet Commonly known as: TYLENOL  Take 650 mg by mouth every 6 (six) hours as needed.   Blood Glucose Monitoring Suppl Devi 1 each by Does not apply route 3 (three) times daily. May dispense any manufacturer covered by patient's insurance.   BLOOD GLUCOSE TEST STRIPS Strp 1 each by Does not apply route 3 (three) times daily. Use as directed to check blood sugar. May dispense any manufacturer covered by patient's insurance and fits patient's device.   ibuprofen  600 MG tablet Commonly known as: ADVIL  Take 1 tablet (600 mg total) by mouth every 6 (six) hours as needed for up to 10 days for mild pain (pain score 1-3) or moderate pain (pain score 4-6).   insulin  glargine 100 UNIT/ML Solostar Pen Commonly known as: LANTUS  Inject 10 Units into the skin daily. May substitute as needed per insurance.   insulin  starter kit- pen needles Misc 1 kit by Other route once for 1 dose.   Lancet Device Misc 1 each by Does not apply route 3 (three) times daily. May dispense any manufacturer covered by patient's insurance.   Lancets Misc 1 each by Does not apply route 3 (three) times daily. Use as directed to check blood sugar. May dispense any manufacturer covered by patient's insurance and fits patient's device.   ondansetron  4 MG tablet Commonly known  as: ZOFRAN  Take 1 tablet (4 mg total) by mouth every 8 (eight) hours as needed for up to 5 days for nausea or vomiting.   oxyCODONE  5 MG immediate release tablet Commonly known as: Oxy IR/ROXICODONE  Take 1 tablet (5 mg total) by mouth every 6 (six) hours as needed for up to 5 days for severe pain (pain score 7-10).   Pen Needles 31G X 5 MM Misc 1 each by Does not apply route 3 (three) times daily. May dispense any manufacturer covered by patient's insurance.        Disposition and follow-up:   [name] was discharged from [facility] in Stable condition.  At the hospital follow up visit please address:  1.  New Diagnosis of Type 2 DM: The patient's CBG was elevated on admission and subsequent A1C was 10.7. She was discharged on 10 units of lantus . Follow up adherence, understanding, and home blood sugars.  Choledocolithiasis and elevated LFTs: Pt s/p cholecystectomy with negative IOC. Follow up pain and LFT's   2.  Labs / imaging needed at time of follow-up: CMP   Follow-up Appointments:  Follow-up Information     Maczis, Elizabeth Gosai, PA-C. Go on 06/11/2024.   Specialty: General Surgery Why: At 9:00AM, Arrive 30 mins prior to scheduled appointment time, Please bring ID/insurance cards Contact information: [address] [facility] [place] [facility] [place] [address] [phone]  Hospital Course by problem list: Elizabeth Saunders is a 68 y.o. 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  She had had this symptom in the  past but was waxing and waning in nature.  Right upper quadrant ultrasound was performed which showed multiple gallstones within the gallbladder without wall thickening.  Cholecystic fluid or sonographic Murphy sign however MRCP showed numerous tiny stones in the gallbladder lumen with several small stones in the distal common bile duct gallbladder distention and mild pericholecystic fluid.  She was evaluated by GI who stated that an ERCP was unable to be performed this weekend due to staffing availability.  General surgery was consulted who recommended cholecystectomy however the patient had eaten that day 7 surgery was postponed to 9/14.  She successfully underwent cholecystectomy with negative IOC so ERCP was deferred.  On the morning of 9/15 she was evaluated and reported improving abdominal pain however she did have tenderness around the surgical sites.  Throughout the day she did tolerate increased food without nausea or vomiting and she was successfully passing gas.  She was deemed stable for discharge at this time.  Type 2 diabetes-new diagnosis Upon admission the patient's blood sugar was elevated at 323.  She did admit to polydipsia and polyuria stated that she had never been diagnosed with diabetes in the past.  An A1c was obtained which was 10.8.  The patient was initiated on 10 units of insulin  glargine with good glycemic control.  Diabetic educator was consulted to spoke with the patient before discharge about diabetes diagnosis and insulin  needs at home.  She was discharged on 10 units of glargine and instructed to check her blood sugar daily every morning.  On 9/15 she was stable for discharge.  Stable chronic medical conditions: Adenocarcinoma of the rectum  Subjective Patient was seen and evaluated at the bedside this morning.  She states that she was feeling better but did have persistent tenderness at the laparoscopic incision sites.  She did admit to 1 episode of nausea and vomiting  after dinner last night however she was able to tolerate breakfast this morning.  We did discuss discharge to which she was agreeable.  All questions and concerns were addressed at this time.  Discharge Exam:   BP (!) 116/58 (BP Location: Right Arm)   Pulse 63   Temp 98 F (36.7 C)   Resp 16   Ht 5' 5 (1.651 m)   SpO2 96%   BMI 19.79 kg/m  Discharge exam:  Const: Awake, alert in NAD HENT: Normocephalic, atraumatic, mucus membranes moist Card: RRR, No MRG, No pitting edema on LE's bilaterally  Resp: LCTAB, no increased work of breathing Abd: Soft, nondistended. Laparoscopic sites with dermabond clean and intact. Tender to palpation Extremities: Warm, dry   Pertinent Labs, Studies, and Procedures:     Latest Ref Rng & Units 05/13/2024    4:09 AM 05/12/2024    5:27 AM 05/11/2024    3:46 AM  CBC  WBC 4.0 - 10.5 K/uL 7.7  6.7  9.1   Hemoglobin 12.0 - 15.0 g/dL 88.9  88.2  87.4   Hematocrit 36.0 - 46.0 %  36.7  37.8  41.6   Platelets 150 - 400 K/uL 218  227  243        Latest Ref Rng & Units 05/13/2024    4:09 AM 05/12/2024    5:27 AM 05/11/2024    6:13 PM  CMP  Glucose 70 - 99 mg/dL 85  743  787   BUN 8 - 23 mg/dL 10  13  8    Creatinine 0.44 - 1.00 mg/dL 9.47  9.35  9.42   Sodium 135 - 145 mmol/L 140  136  136   Potassium 3.5 - 5.1 mmol/L 3.3  3.7  4.1   Chloride 98 - 111 mmol/L 105  105  100   CO2 22 - 32 mmol/L 24  22  23    Calcium  8.9 - 10.3 mg/dL 8.2  8.5  8.8   Total Protein 6.5 - 8.1 g/dL 5.7  6.4    Total Bilirubin 0.0 - 1.2 mg/dL 2.9  5.2    Alkaline Phos 38 - 126 U/L 179  198    AST 15 - 41 U/L 134  293    ALT 0 - 44 U/L 216  297      DG Cholangiogram Operative Result Date: 05/12/2024 EXAM: INTRAOPERATIVE CHOLANGIOGRAM TECHNIQUE: Fluoroscopy provided by the radiology department. Radiologist was not present during procedure. Image(s) submitted for review. FLUOROSCOPY DOSE AND TYPE: Radiation Dose Index: Reference Air Kerma (in mGy) = 1.63 COMPARISON: None  available. CLINICAL HISTORY: [id number] Surgery, elective [id number]. LAPAROSCOPIC CHOLECYSTECTOMY WITH INTRAOPERATIVE CHOLANGIOGRAM; RSTO- Domingo Nap; Fluoro time: 7 sec; mGy: 1.63; Dr. Lyndel; [name] Room 09 FINDINGS: Status post cholecystectomy without leak. No filling defect to suggest choledocholithiasis. Normal contrast emptying into the small bowel. IMPRESSION: 1. Unremarkable intraoperative cholangiogram status post cholecystectomy. Electronically signed by: Lonni Necessary MD 05/12/2024 04:11 PM EDT RP Workstation: [id number]   MR ABDOMEN MRCP W WO CONTAST Result Date: 05/11/2024 EXAM: MRCP WITH AND WITHOUT IV CONTRAST 05/11/2024 10:13:00 AM TECHNIQUE: Multisequence, multiplanar magnetic resonance images of the abdomen with and without intravenous contrast. MRCP sequences were performed. COMPARISON: None available. CLINICAL HISTORY: RUQ abdominal pain, biliary disease suspected, US  nondiagnostic. FINDINGS: LIVER: There is diffuse hepatic steatosis. No focal liver lesion. GALLBLADDER AND BILIARY SYSTEM: Gallbladder appears distended. There is mild pericholecystic fluid. Numerous tiny stones are identified within the gallbladder lumen measuring up to 3 mm. The common bile duct measures 8 mm, image 12/3. There are several small stones in the distal common bile duct which measure up to 3 mm, image 13/3. SPLEEN: Normal appearance. PANCREAS/PANCREATIC DUCT: Normal appearance of the pancreas without signs of main duct dilatation, inflammation or mass. ADRENAL GLANDS: Normal. KIDNEYS: Small, bilateral, less than 1 cm kidney cysts. No signs of obstructive uropathy. LYMPH NODES: No enlarged abdominal lymph nodes. VASULATURE: Unremarkable. PERITONEUM: No ascites. ABDOMINAL WALL: No hernia. No mass. BOWEL: Grossly unremarkable. No bowel obstruction. BONES: No acute abnormality or worrisome osseous lesion. SOFT TISSUES: Unremarkable. MISCELLANEOUS: No follow up imaging recommended. IMPRESSION: 1.  Numerous tiny stones in the gallbladder lumen and several small stones in the distal common bile duct. 2. Gallbladder distention and mild pericholecystic fluid. 3. Diffuse hepatic steatosis. No focal liver lesion. Electronically signed by: Waddell Calk MD 05/11/2024 10:42 AM EDT RP Workstation: [id number]   US  Abdomen Limited RUQ (LIVER/GB) Result Date: 05/11/2024 EXAM: Right Upper Quadrant Abdominal Ultrasound TECHNIQUE: Real-time ultrasonography of the right upper quadrant of the abdomen was performed. COMPARISON: 05/18/2021 CLINICAL HISTORY: RUQ abdominal pain FINDINGS: LIVER: Increased parenchymal echogenicity  of the liver. No focal liver lesion. The portal vein is patent with normal flow towards the liver. BILIARY SYSTEM: Multiple stones identified within the gallbladder. No gallbladder wall thickening, pericholecystic fluid or sonographic Murphy's sign. Increased caliber of the common bile duct with intrahepatic bile duct dilatation noted. The common bile duct measures 8.5 mm. RIGHT KIDNEY: Visualized portions of the right kidney appear normal. PANCREAS: Pancreas is not well visualized. OTHER: No right upper quadrant ascites. IMPRESSION: 1. Multiple gallstones within the gallbladder without wall thickening, pericholecystic fluid, or sonographic Murphy sign. 2. Increased caliber of the common bile duct measuring 8.5 mm with intrahepatic bile duct dilatation. If there is a clinical concern for choledocholithiasis, consider more definitive characterization with MRCP. 3. Hepatic steatosis. Electronically signed by: Waddell Calk MD 05/11/2024 08:29 AM EDT RP Workstation: [id number]     Discharge Instructions: Discharge Instructions     Call MD for:  persistant nausea and vomiting   Complete by: As directed    Call MD for:  redness, tenderness, or signs of infection (pain, swelling, redness, odor or green/yellow discharge around incision site)   Complete by: As directed    Call MD for:  severe  uncontrolled pain   Complete by: As directed    Call MD for:  temperature >100.4   Complete by: As directed    Diet - low sodium heart healthy   Complete by: As directed    Discharge instructions   Complete by: As directed    Thank you for allowing us  to be part of your care. You were hospitalized for infection of the gallbladder. We treated you with antibiotics and surgery.  See the changes in your medications and management of your chronic conditions below:  *For your pain -We have STARTED you on these following medications:  - Oxycodone  5 mg--take up to 1 tablet every 6 hours as needed for significant pain  - Ibuprofen  600 mg--take up to 600 mg of ibuprofen  as needed for mild to moderate pain every 6 hours  *For your diabetes -We have STARTED you on these following medications:  - Insulin  (Lantus )--inject 10 units into the skin at the same time every day.  - It is important that you take this every day.  - Please check your blood sugar each morning, write these down, and bring them with you to your follow-up appointment.  - Please call your doctor if your sugar is staying below 70 or you are feeling shaky, related, or like you are going to pass out.  -Make sure to maintain a good diet with decreased carbs and sugars.  -Please see your PCP in 7 to 10 days  FOLLOW UP APPOINTMENTS: We arranged for you to follow up with your family doctor at: [facility] with Dr. Myrna on 9/25 at 8:45 AM Please visit your surgeon on 10/14 at 9 AM  Please make sure to keep your incision sites clean and call your doctor if they become red, more painful, or begin draining  Please call your PCP or our clinic if you have any questions or concerns, we may be able to help and keep you from a long and expensive emergency room wait. Our clinic and after hours phone number is [phone]. The best time to call is Monday through Friday 9 am to 4 pm but there is always someone available 24/7 if  you have an emergency. If you need medication refills please notify your pharmacy one week in advance and they will send  us  a request.   We are glad you are feeling better,  Schuyler Novak Internal Medicine Inpatient Teaching Service at [facility]   Increase activity slowly   Complete by: As directed        Signed: Novak Schuyler, DO 05/13/2024, 2:17 PM

## 2024-05-13 NOTE — Progress Notes (Signed)
 Progress Note  1 Day Post-Op  Subjective: Family member provided interpretation for encounter. Declined an interpreter.  Patient feels that pain is manageable. She reports some nausea and one episode of emesis after surgery yesterday. Has had some intake this morning and has tolerated well. Denies nausea and vomiting currently. Reports flatulence. No BM yet.  ROS  All negative with the exception of above.  Objective: Vital signs in last 24 hours: Temp:  [97.8 F (36.6 C)-98.6 F (37 C)] 98 F (36.7 C) (09/15 0807) Pulse Rate:  [55-63] 63 (09/15 0807) Resp:  [14-18] 16 (09/15 0807) BP: (106-160)/(57-71) 116/58 (09/15 0807) SpO2:  [93 %-100 %] 96 % (09/15 0807) Last BM Date : 05/12/24  Intake/Output from previous day: 09/14 0701 - 09/15 0700 In: 1385.1 [I.V.:1010.3; IV Piggyback:374.8] Out: 60 [Urine:50; Blood:10] Intake/Output this shift: No intake/output data recorded.  PE: General: Pleasant female who is laying in bed in NAD. Heart: HR normal Lungs: Respiratory effort nonlabored Abd: Soft, ND. Mild generalized tenderness to palpation. Incisions C/D/I. No rebound tenderness or guarding.  Skin: Warm and dry with no masses. Psych: A&Ox3 with an appropriate affect.    Lab Results:  Recent Labs    05/12/24 0527 05/13/24 0409  WBC 6.7 7.7  HGB 11.7* 11.0*  HCT 37.8 36.7  PLT 227 218   BMET Recent Labs    05/12/24 0527 05/13/24 0409  NA 136 140  K 3.7 3.3*  CL 105 105  CO2 22 24  GLUCOSE 256* 85  BUN 13 10  CREATININE 0.64 0.52  CALCIUM  8.5* 8.2*   PT/INR Recent Labs    05/12/24 0527  LABPROT 14.3  INR 1.1   CMP     Component Value Date/Time   NA 140 05/13/2024 0409   NA 141 07/18/2019 1102   K 3.3 (L) 05/13/2024 0409   CL 105 05/13/2024 0409   CO2 24 05/13/2024 0409   GLUCOSE 85 05/13/2024 0409   BUN 10 05/13/2024 0409   BUN 11 07/18/2019 1102   CREATININE 0.52 05/13/2024 0409   CREATININE 0.72 12/15/2020 0759   CALCIUM  8.2 (L)  05/13/2024 0409   PROT 5.7 (L) 05/13/2024 0409   PROT 7.7 07/18/2019 1102   ALBUMIN  2.8 (L) 05/13/2024 0409   AST 134 (H) 05/13/2024 0409   AST 25 05/15/2018 0915   ALT 216 (H) 05/13/2024 0409   ALT 15 05/15/2018 0915   ALKPHOS 179 (H) 05/13/2024 0409   BILITOT 2.9 (H) 05/13/2024 0409   BILITOT 0.9 05/15/2018 0915   GFRNONAA >60 05/13/2024 0409   GFRNONAA >60 12/15/2020 0759   GFRAA >60 12/16/2019 1110   Lipase     Component Value Date/Time   LIPASE 40 05/11/2024 0346       Studies/Results: DG Cholangiogram Operative Result Date: 05/12/2024 EXAM: INTRAOPERATIVE CHOLANGIOGRAM TECHNIQUE: Fluoroscopy provided by the radiology department. Radiologist was not present during procedure. Image(s) submitted for review. FLUOROSCOPY DOSE AND TYPE: Radiation Dose Index: Reference Air Kerma (in mGy) = 1.63 COMPARISON: None available. CLINICAL HISTORY: [id number] Surgery, elective [id number]. LAPAROSCOPIC CHOLECYSTECTOMY WITH INTRAOPERATIVE CHOLANGIOGRAM; RSTO- Domingo Nap; Fluoro time: 7 sec; mGy: 1.63; Dr. Lyndel; [name] Room 09 FINDINGS: Status post cholecystectomy without leak. No filling defect to suggest choledocholithiasis. Normal contrast emptying into the small bowel. IMPRESSION: 1. Unremarkable intraoperative cholangiogram status post cholecystectomy. Electronically signed by: Lonni Necessary MD 05/12/2024 04:11 PM EDT RP Workstation: [id number]   MR ABDOMEN MRCP W WO CONTAST Result Date: 05/11/2024 EXAM: MRCP WITH AND WITHOUT IV  CONTRAST 05/11/2024 10:13:00 AM TECHNIQUE: Multisequence, multiplanar magnetic resonance images of the abdomen with and without intravenous contrast. MRCP sequences were performed. COMPARISON: None available. CLINICAL HISTORY: RUQ abdominal pain, biliary disease suspected, US  nondiagnostic. FINDINGS: LIVER: There is diffuse hepatic steatosis. No focal liver lesion. GALLBLADDER AND BILIARY SYSTEM: Gallbladder appears distended. There is mild  pericholecystic fluid. Numerous tiny stones are identified within the gallbladder lumen measuring up to 3 mm. The common bile duct measures 8 mm, image 12/3. There are several small stones in the distal common bile duct which measure up to 3 mm, image 13/3. SPLEEN: Normal appearance. PANCREAS/PANCREATIC DUCT: Normal appearance of the pancreas without signs of main duct dilatation, inflammation or mass. ADRENAL GLANDS: Normal. KIDNEYS: Small, bilateral, less than 1 cm kidney cysts. No signs of obstructive uropathy. LYMPH NODES: No enlarged abdominal lymph nodes. VASULATURE: Unremarkable. PERITONEUM: No ascites. ABDOMINAL WALL: No hernia. No mass. BOWEL: Grossly unremarkable. No bowel obstruction. BONES: No acute abnormality or worrisome osseous lesion. SOFT TISSUES: Unremarkable. MISCELLANEOUS: No follow up imaging recommended. IMPRESSION: 1. Numerous tiny stones in the gallbladder lumen and several small stones in the distal common bile duct. 2. Gallbladder distention and mild pericholecystic fluid. 3. Diffuse hepatic steatosis. No focal liver lesion. Electronically signed by: Waddell Calk MD 05/11/2024 10:42 AM EDT RP Workstation: [id number]    Anti-infectives: Anti-infectives (From admission, onward)    Start     Dose/Rate Route Frequency Ordered Stop   05/11/24 1400  ampicillin -sulbactam (UNASYN ) 1.5 g in sodium chloride  0.9 % 100 mL IVPB        1.5 g 200 mL/hr over 30 Minutes Intravenous Every 6 hours 05/11/24 1236          Assessment/Plan POD1: S/P laparoscopic cholecystectomy with IOC by Dr. Deward Foy on 05/12/2024. -IOC was negative. No further procedures recommended. -Afebrile. -WBC 7.7; HGB 11.0 -Labs downtrending overall: AST 134 from 293, ALT 216 from 297, Alk Phos 179 from 198, Total bili 2.9 from 5.2. -Pain manageable. Tolerating regular diet this morning. No bm. Having flatulence. -Discussed post-operative expectations and restrictions in great detail. 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 Arranged outpatient follow up and information is in AVS. Stable from general surgery standpoint once medically cleared. Please contact for further questions or concerns.   FEN: Regular  VTE: SCDs ID: [email or web address]     LOS: 2 days   I reviewed nursing notes, specialist notes, hospitalist notes, last 24 h vitals and pain scores, last 48 h intake and output, last 24 h labs and trends, and last 24 h imaging results.   Marjorie Carlyon Favre, [facility] 05/13/2024, 9:49 AM Please see Amion for pager number during day hours 7:00am-4:30pm

## 2024-05-13 NOTE — Progress Notes (Signed)
 Reviewed AVS, patient expressed understanding of medications, MD follow up reviewed.   See LDA for information on wounds at discharge. Patient states all belongings brought to the hospital at time of admission are accounted for and packed to take home.  Picked up medications from [facility] pharmacy.  Family refused interpreter and son assisted if patient had any concerns.  Patient transferred via wheelchair to main entrance where family member was waiting in vehicle to transport home.

## 2024-05-14 ENCOUNTER — Other Ambulatory Visit (HOSPITAL_COMMUNITY): Payer: Self-pay

## 2024-05-15 LAB — SURGICAL PATHOLOGY

## 2024-05-23 ENCOUNTER — Ambulatory Visit (INDEPENDENT_AMBULATORY_CARE_PROVIDER_SITE_OTHER)

## 2024-05-23 VITALS — BP 115/75 | HR 62 | Temp 97.8°F | Ht 65.0 in | Wt 111.8 lb

## 2024-05-23 DIAGNOSIS — Z9049 Acquired absence of other specified parts of digestive tract: Secondary | ICD-10-CM

## 2024-05-23 DIAGNOSIS — Z8719 Personal history of other diseases of the digestive system: Secondary | ICD-10-CM | POA: Diagnosis not present

## 2024-05-23 DIAGNOSIS — R1032 Left lower quadrant pain: Secondary | ICD-10-CM | POA: Diagnosis not present

## 2024-05-23 DIAGNOSIS — Z794 Long term (current) use of insulin: Secondary | ICD-10-CM | POA: Diagnosis not present

## 2024-05-23 DIAGNOSIS — E1165 Type 2 diabetes mellitus with hyperglycemia: Secondary | ICD-10-CM

## 2024-05-23 DIAGNOSIS — K8042 Calculus of bile duct with acute cholecystitis without obstruction: Secondary | ICD-10-CM

## 2024-05-23 NOTE — Patient Instructions (Signed)
 Thank you, Ms.Jadaya Kluender, for allowing us  to provide your care today. Today we discussed . . .  > Your abdominal pain       - It looks like your surgery scars are healing very well.        - For the pain on the left side, you may take 500mg  of tylenol  as needed up to 4 times per day.  > Your Diabetes       - Your blood sugars look great overall! Keep taking your insulin  as ordered for now       - We orders labs to check and if they come back normal, we can try to transition you to a pill.   Keep doing what you are doing and call if you have any questions!   I have ordered the following labs for you:   Lab Orders         Anti-islet cell antibody         Glutamic acid decarboxylase auto abs         ZNT8 Antibodies         C-peptide         Comprehensive metabolic panel with GFR         Follow up: 2 weeks    Remember:  Should you have any questions or concerns please call the internal medicine clinic at [phone].     Schuyler Novak, DO [facility]

## 2024-05-23 NOTE — Progress Notes (Addendum)
 Established Patient Office Visit  Subjective   Patient ID: Steffanie Mingle, female    DOB: 03-Nov-1955  Age: 68 y.o. MRN: [id number]  Chief Complaint  Patient presents with   COPD   Hospitalization Follow-up    FOLLOW UP FROM HOSPITAL / ABD PAIN # 1  / NO CONCERNS - SON IS INTERPRETING FOR HIS MOM   Ms. Lutzke is a 56yo F with PMH of adenocarcinoma of the rectum who presents today for a hospital follow up.   Follow up Hospitalization  Patient was admitted to [facility] on 9/13 and discharged on 9/15. She was treated for Choledocholithiasis with associated cholecystitis as well as newly diagnosed diabetes Treatment for this included cholecystectomy, perioperative antibiotics, and insulin . She reports excellent compliance with treatment. She reports this condition is improved.  -------------------------------------------------------------------------------------------  Review of Systems  Constitutional:  Negative for chills, fever, malaise/fatigue and weight loss.  Respiratory:  Negative for cough.   Cardiovascular:  Negative for chest pain, palpitations and leg swelling.  Gastrointestinal:  Positive for abdominal pain. Negative for constipation, diarrhea, heartburn, nausea and vomiting.       Pt reports chronic pain in LLQ and resolved RUQ pain  Genitourinary:  Negative for dysuria, flank pain, frequency, hematuria and urgency.  Musculoskeletal:  Negative for back pain.  Neurological:  Negative for tingling and sensory change.      Objective:     BP 115/75 (BP Location: Left Arm, Patient Position: Sitting, Cuff Size: Small)   Pulse 62   Temp 97.8 F (36.6 C) (Oral)   Ht 5' 5 (1.651 m)   Wt 111 lb 12.8 oz (50.7 kg)   SpO2 98%   BMI 18.60 kg/m   Const: Awake, alert in NAD HENT: Normocephalic, atraumatic, mucus membranes moist Card: RRR, No MRG, No pitting edema on LE's bilaterally  Resp: LCTAB, no increased work of breathing, no wheezes, ronchi or rales Abd: Soft, NTND,  Bsx4, laparoscopy sites c/d/i  Extremities: Warm, dry  Results for orders placed or performed in visit on 05/23/24  Anti-islet cell antibody  Result Value Ref Range   Islet Cell Ab Negative Neg:<1:1  C-peptide  Result Value Ref Range   C-Peptide 1.5 1.1 - 4.4 ng/mL  Comprehensive metabolic panel with GFR  Result Value Ref Range   Glucose 111 (H) 70 - 99 mg/dL   BUN 9 8 - 27 mg/dL   Creatinine, Ser 9.46 (L) 0.57 - 1.00 mg/dL   eGFR 897 >40 fO/fpw/8.26   BUN/Creatinine Ratio 17 12 - 28   Sodium 137 134 - 144 mmol/L   Potassium 4.3 3.5 - 5.2 mmol/L   Chloride 102 96 - 106 mmol/L   CO2 19 (L) 20 - 29 mmol/L   Calcium  9.3 8.7 - 10.3 mg/dL   Total Protein 7.3 6.0 - 8.5 g/dL   Albumin  4.1 3.9 - 4.9 g/dL   Globulin, Total 3.2 1.5 - 4.5 g/dL   Bilirubin Total 0.8 0.0 - 1.2 mg/dL   Alkaline Phosphatase 140 (H) 49 - 135 IU/L   AST 18 0 - 40 IU/L   ALT 20 0 - 32 IU/L  Glutamic acid decarboxylase auto abs  Result Value Ref Range   Glutamic Acid Decarb Ab <5.0 0.0 - 5.0 U/mL  ZNT8 Antibodies  Result Value Ref Range   ZNT8 Antibodies WILL FOLLOW     Last metabolic panel Lab Results  Component Value Date   GLUCOSE 111 (H) 05/23/2024   NA 137 05/23/2024   K 4.3 05/23/2024  CL 102 05/23/2024   CO2 19 (L) 05/23/2024   BUN 9 05/23/2024   CREATININE 0.53 (L) 05/23/2024   GFRNONAA >60 05/13/2024   CALCIUM  9.3 05/23/2024   PROT 7.3 05/23/2024   ALBUMIN  4.1 05/23/2024   LABGLOB 3.2 05/23/2024   AGRATIO 1.2 07/18/2019   BILITOT 0.8 05/23/2024   ALKPHOS 140 (H) 05/23/2024   AST 18 05/23/2024   ALT 20 05/23/2024   ANIONGAP 11 05/13/2024      The 10-year ASCVD risk score (Arnett DK, et al., 2019) is: 9.7%    Assessment & Plan:   Problem List Items Addressed This Visit       Digestive   Choledocholithiasis with acute cholecystitis   The patient was recently hospitalized and underwent cholecystectomy on 9/14 with Dr. Lyndel. Today she is feeling well and denies RUQ or  incisional pain. Instructed her that she can continue to take tylenol  and ibuprofen  as needed for breakthrough pain. Her LFT's were elevated on admission and had begun to trend down post operatively. Will recheck with CMP today.       Relevant Orders   Comprehensive metabolic panel with GFR (Completed)     Endocrine   Type 2 diabetes mellitus with hyperglycemia (HCC) - Primary   The patient was diagnosed with diabetes during her hospital stay.  At that time her A1c was 10.7.  She was also found to be in a mild state of acidosis, potentially indicative of DKA. She was discharged on 10 units of glargine with good compliance since.  She did bring home log and her fasting blood sugars ranged from 111-169.  At this time we will continue current regimen.  She did express desire to transition to oral diabetic management. A long discussion was had regarding her late onset diabetes and the need to rule out insulin  deficiency before adjusting medications. She and her son verbalized understanding. Today, will obtain CMP, ZNT8, GAD, Anti-islet cell, and c-peptide to further differentiate cause of diabetic state. If these are normal, we can attempt to adjust to oral diabetic regimen. The family was in agreement with this plan. Follow up in 2-4 weeks.   Addendum: Pts antibody work up was negative, will initiate Metformin 500mg  nightly and titrate up as needed. Continue insulin  for now. Follow up in 4 weeks.       Relevant Medications   metFORMIN (GLUCOPHAGE-XR) 500 MG 24 hr tablet   Other Relevant Orders   Anti-islet cell antibody (Completed)   C-peptide (Completed)   Comprehensive metabolic panel with GFR (Completed)   Glutamic acid decarboxylase auto abs (Completed)   ZNT8 Antibodies (Completed)     Other   LLQ abdominal pain   Patient was complaining of left lower quadrant pain today.  She states that this has been going on for months and was an issue before she was hospitalized.  She reports that it  is positional and often a crampy or pulling pain.  She did undergo extensive surgery at the time of her cancer diagnosis of adenocarcinoma of the rectum.  There is documentation of extensive adhesions being found with conversion to an open procedure as a result.  She is stooling regularly without diarrhea or constipation.  She is denying fevers and chills.  Minimal concern at this time for acute pathology.  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  Discussed this with her and she verbalized understanding.  She was instructed that if her stool habits change or she develops significant,  continuous abdominal pain or fever and chills to call the office.  Instructed that she may take Tylenol  and ibuprofen  as needed for pain.       Return in about 4 weeks (around 06/20/2024).    Schuyler Novak, DO

## 2024-05-23 NOTE — Progress Notes (Signed)
 Internal Medicine Clinic Attending  I was physically present during the key portions of the resident provided service and participated in the medical decision making of patient's management care. I reviewed pertinent patient test results.  The assessment, diagnosis, and plan were formulated together and I agree with the documentation in the resident's note.  Jeanelle Layman CROME, MD

## 2024-05-23 NOTE — Assessment & Plan Note (Addendum)
 Patient was complaining of left lower quadrant pain today.  She states that this has been going on for months and was an issue before she was hospitalized.  She reports that it is positional and often a crampy or pulling pain.  She did undergo extensive surgery at the time of her cancer diagnosis of adenocarcinoma of the rectum.  There is documentation of extensive adhesions being found with conversion to an open procedure as a result.  She is stooling regularly without diarrhea or constipation.  She is denying fevers and chills.  Minimal concern at this time for acute pathology.  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  Discussed this with her and she verbalized understanding.  She was instructed that if her stool habits change or she develops significant, continuous abdominal pain or fever and chills to call the office.  Instructed that she may take Tylenol  and ibuprofen  as needed for pain.

## 2024-05-23 NOTE — Assessment & Plan Note (Signed)
 The patient was recently hospitalized and underwent cholecystectomy on 9/14 with Dr. Lyndel. Today she is feeling well and denies RUQ or incisional pain. Instructed her that she can continue to take tylenol  and ibuprofen  as needed for breakthrough pain. Her LFT's were elevated on admission and had begun to trend down post operatively. Will recheck with CMP today.

## 2024-05-23 NOTE — Assessment & Plan Note (Addendum)
 The patient was diagnosed with diabetes during her hospital stay.  At that time her A1c was 10.7.  She was also found to be in a mild state of acidosis, potentially indicative of DKA. She was discharged on 10 units of glargine with good compliance since.  She did bring home log and her fasting blood sugars ranged from 111-169.  At this time we will continue current regimen.  She did express desire to transition to oral diabetic management. A long discussion was had regarding her late onset diabetes and the need to rule out insulin  deficiency before adjusting medications. She and her son verbalized understanding. Today, will obtain CMP, ZNT8, GAD, Anti-islet cell, and c-peptide to further differentiate cause of diabetic state. If these are normal, we can attempt to adjust to oral diabetic regimen. The family was in agreement with this plan. Follow up in 2-4 weeks.   Addendum: Pts antibody work up was negative, will initiate Metformin 500mg  nightly and titrate up as needed. Continue insulin  for now. Follow up in 4 weeks.

## 2024-05-23 NOTE — Assessment & Plan Note (Deleted)
 The patient was diagnosed with diabetes during her hospital stay.  At that time her A1c was 10.7.  She was discharged on 10 units of glargine with good compliance since.  She did bring home log and her fasting blood sugars ranged from 111-169.  At this time we will continue current regimen.  She did express desire to transition to oral diabetic management. A long discussion was had regarding her late onset diabetes and the need to rule out insulin  deficiency before adjusting medications. She and her son verbalized understanding. Today, will obtain CMP, ZNT8, GAD, Anti-islet cell, and c-peptide to further differentiate cause of diabetic state. If these are normal, we can attempt to adjust to oral diabetic regimen. The family was in agreement with this plan. Follow up in 2-4 weeks.

## 2024-05-31 ENCOUNTER — Ambulatory Visit: Payer: Self-pay

## 2024-05-31 MED ORDER — METFORMIN HCL ER 500 MG PO TB24: 500.0000 mg | ORAL_TABLET | Freq: Every day | ORAL | 11 refills | Status: AC

## 2024-05-31 NOTE — Addendum Note (Signed)
 Addended by: MYRNA BITTERS on: 05/31/2024 09:38 AM   Modules accepted: Orders

## 2024-05-31 NOTE — Progress Notes (Signed)
 Pt's antibody work up negative. This is likely a type II diabetes and not a result of pancreatic insufficiency or autoimmune process. Will begin process of transitioning pt to oral regimen and off of insulin . Pt's son called and plan discussed with him.

## 2024-05-31 NOTE — Progress Notes (Signed)
 CMP showing improvement in transaminases after cholecystectomy.

## 2024-06-04 LAB — COMPREHENSIVE METABOLIC PANEL WITH GFR
ALT: 20 IU/L (ref 0–32)
AST: 18 IU/L (ref 0–40)
Albumin: 4.1 g/dL (ref 3.9–4.9)
Alkaline Phosphatase: 140 IU/L — ABNORMAL HIGH (ref 49–135)
BUN/Creatinine Ratio: 17 (ref 12–28)
BUN: 9 mg/dL (ref 8–27)
Bilirubin Total: 0.8 mg/dL (ref 0.0–1.2)
CO2: 19 mmol/L — ABNORMAL LOW (ref 20–29)
Calcium: 9.3 mg/dL (ref 8.7–10.3)
Chloride: 102 mmol/L (ref 96–106)
Creatinine, Ser: 0.53 mg/dL — ABNORMAL LOW (ref 0.57–1.00)
Globulin, Total: 3.2 g/dL (ref 1.5–4.5)
Glucose: 111 mg/dL — ABNORMAL HIGH (ref 70–99)
Potassium: 4.3 mmol/L (ref 3.5–5.2)
Sodium: 137 mmol/L (ref 134–144)
Total Protein: 7.3 g/dL (ref 6.0–8.5)
eGFR: 102 mL/min/1.73 (ref >59)

## 2024-06-04 LAB — GLUTAMIC ACID DECARBOXYLASE AUTO ABS: Glutamic Acid Decarb Ab: <5 U/mL (ref 0.0–5.0)

## 2024-06-04 LAB — ANTI-ISLET CELL ANTIBODY: Islet Cell Ab: NEGATIVE

## 2024-06-04 LAB — ZNT8 ANTIBODIES: ZNT8 Antibodies: <15 U/mL

## 2024-06-04 LAB — C-PEPTIDE: C-Peptide: 1.5 ng/mL (ref 1.1–4.4)

## 2024-06-24 ENCOUNTER — Ambulatory Visit: Admitting: Dietician

## 2024-06-24 ENCOUNTER — Ambulatory Visit: Admitting: Student
